# Patient Record
Sex: Male | Born: 1955
Health system: Southern US, Community
[De-identification: ages and names within clinical notes are randomized; demographics above are authoritative.]

## PROBLEM LIST (undated history)

## (undated) DIAGNOSIS — K5792 Diverticulitis of intestine, part unspecified, without perforation or abscess without bleeding: Secondary | ICD-10-CM

## (undated) DIAGNOSIS — J449 Chronic obstructive pulmonary disease, unspecified: Secondary | ICD-10-CM

## (undated) DIAGNOSIS — D696 Thrombocytopenia, unspecified: Secondary | ICD-10-CM

## (undated) DIAGNOSIS — D46Z Other myelodysplastic syndromes: Secondary | ICD-10-CM

## (undated) DIAGNOSIS — N179 Acute kidney failure, unspecified: Secondary | ICD-10-CM

## (undated) DIAGNOSIS — M1990 Unspecified osteoarthritis, unspecified site: Secondary | ICD-10-CM

## (undated) DIAGNOSIS — E872 Acidosis, unspecified: Secondary | ICD-10-CM

## (undated) DIAGNOSIS — K922 Gastrointestinal hemorrhage, unspecified: Secondary | ICD-10-CM

## (undated) DIAGNOSIS — F419 Anxiety disorder, unspecified: Secondary | ICD-10-CM

## (undated) DIAGNOSIS — K7460 Unspecified cirrhosis of liver: Secondary | ICD-10-CM

## (undated) DIAGNOSIS — R45851 Suicidal ideations: Secondary | ICD-10-CM

## (undated) DIAGNOSIS — K219 Gastro-esophageal reflux disease without esophagitis: Secondary | ICD-10-CM

## (undated) DIAGNOSIS — M8700 Idiopathic aseptic necrosis of unspecified bone: Secondary | ICD-10-CM

## (undated) DIAGNOSIS — R51 Headache: Secondary | ICD-10-CM

## (undated) DIAGNOSIS — F32A Depression, unspecified: Secondary | ICD-10-CM

## (undated) DIAGNOSIS — F329 Major depressive disorder, single episode, unspecified: Secondary | ICD-10-CM

## (undated) DIAGNOSIS — D649 Anemia, unspecified: Secondary | ICD-10-CM

## (undated) HISTORY — PX: HERNIA REPAIR: SHX51

## (undated) HISTORY — PX: JOINT REPLACEMENT: SHX530

## (undated) HISTORY — PX: COLOSTOMY REVERSAL: SHX5782

## (undated) HISTORY — PX: HIP SURGERY: SHX245

## (undated) HISTORY — PX: COLON SURGERY: SHX602

---
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
## 1979-08-15 HISTORY — PX: CLAVICLE SURGERY: SHX598

## 1979-08-15 HISTORY — PX: CHEST TUBE INSERTION: SHX231

## 2011-12-27 DIAGNOSIS — I10 Essential (primary) hypertension: Secondary | ICD-10-CM | POA: Diagnosis not present

## 2011-12-27 DIAGNOSIS — F17200 Nicotine dependence, unspecified, uncomplicated: Secondary | ICD-10-CM | POA: Diagnosis not present

## 2011-12-27 DIAGNOSIS — N529 Male erectile dysfunction, unspecified: Secondary | ICD-10-CM | POA: Diagnosis not present

## 2012-05-10 DIAGNOSIS — Z23 Encounter for immunization: Secondary | ICD-10-CM | POA: Diagnosis not present

## 2012-06-07 DIAGNOSIS — K648 Other hemorrhoids: Secondary | ICD-10-CM | POA: Diagnosis not present

## 2012-06-12 ENCOUNTER — Inpatient Hospital Stay: Payer: Self-pay | Admitting: Surgery

## 2012-06-12 DIAGNOSIS — F17200 Nicotine dependence, unspecified, uncomplicated: Secondary | ICD-10-CM | POA: Diagnosis present

## 2012-06-12 DIAGNOSIS — R109 Unspecified abdominal pain: Secondary | ICD-10-CM | POA: Diagnosis not present

## 2012-06-12 DIAGNOSIS — I10 Essential (primary) hypertension: Secondary | ICD-10-CM | POA: Diagnosis present

## 2012-06-12 DIAGNOSIS — L03319 Cellulitis of trunk, unspecified: Secondary | ICD-10-CM | POA: Diagnosis not present

## 2012-06-12 DIAGNOSIS — Z452 Encounter for adjustment and management of vascular access device: Secondary | ICD-10-CM | POA: Diagnosis not present

## 2012-06-12 DIAGNOSIS — R110 Nausea: Secondary | ICD-10-CM | POA: Diagnosis not present

## 2012-06-12 DIAGNOSIS — R0602 Shortness of breath: Secondary | ICD-10-CM | POA: Diagnosis not present

## 2012-06-12 DIAGNOSIS — J9600 Acute respiratory failure, unspecified whether with hypoxia or hypercapnia: Secondary | ICD-10-CM | POA: Diagnosis present

## 2012-06-12 DIAGNOSIS — R918 Other nonspecific abnormal finding of lung field: Secondary | ICD-10-CM | POA: Diagnosis not present

## 2012-06-12 DIAGNOSIS — K651 Peritoneal abscess: Secondary | ICD-10-CM | POA: Diagnosis not present

## 2012-06-12 DIAGNOSIS — K219 Gastro-esophageal reflux disease without esophagitis: Secondary | ICD-10-CM | POA: Diagnosis present

## 2012-06-12 DIAGNOSIS — IMO0002 Reserved for concepts with insufficient information to code with codable children: Secondary | ICD-10-CM | POA: Diagnosis not present

## 2012-06-12 DIAGNOSIS — R000 Tachycardia, unspecified: Secondary | ICD-10-CM | POA: Diagnosis not present

## 2012-06-12 DIAGNOSIS — R6520 Severe sepsis without septic shock: Secondary | ICD-10-CM | POA: Diagnosis present

## 2012-06-12 DIAGNOSIS — K5732 Diverticulitis of large intestine without perforation or abscess without bleeding: Secondary | ICD-10-CM | POA: Diagnosis not present

## 2012-06-12 DIAGNOSIS — E41 Nutritional marasmus: Secondary | ICD-10-CM | POA: Diagnosis not present

## 2012-06-12 DIAGNOSIS — A419 Sepsis, unspecified organism: Secondary | ICD-10-CM | POA: Diagnosis not present

## 2012-06-12 DIAGNOSIS — R6521 Severe sepsis with septic shock: Secondary | ICD-10-CM | POA: Diagnosis present

## 2012-06-12 DIAGNOSIS — E876 Hypokalemia: Secondary | ICD-10-CM | POA: Diagnosis present

## 2012-06-12 LAB — URINALYSIS, COMPLETE
Bilirubin,UR: NEGATIVE
Glucose,UR: NEGATIVE mg/dL (ref 0–75)
Hyaline Cast: 1
Ph: 6 (ref 4.5–8.0)
RBC,UR: 17 /HPF (ref 0–5)
Specific Gravity: 1.021 (ref 1.003–1.030)
Squamous Epithelial: <1

## 2012-06-12 LAB — CBC
HCT: 54.3 % — ABNORMAL HIGH (ref 40.0–52.0)
HGB: 17.9 g/dL (ref 13.0–18.0)
MCH: 29.9 pg (ref 26.0–34.0)
MCHC: 33 g/dL (ref 32.0–36.0)
MCV: 91 fL (ref 80–100)
Platelet: 412 10*3/uL (ref 150–440)
RBC: 6 10*6/uL — ABNORMAL HIGH (ref 4.40–5.90)

## 2012-06-12 LAB — COMPREHENSIVE METABOLIC PANEL
Albumin: 2.7 g/dL — ABNORMAL LOW (ref 3.4–5.0)
Anion Gap: 13 (ref 7–16)
BUN: 31 mg/dL — ABNORMAL HIGH (ref 7–18)
Bilirubin,Total: 1 mg/dL (ref 0.2–1.0)
Creatinine: 1.02 mg/dL (ref 0.60–1.30)
EGFR (African American): >60
Glucose: 131 mg/dL — ABNORMAL HIGH (ref 65–99)
Potassium: 2.3 mmol/L — CL (ref 3.5–5.1)
Sodium: 133 mmol/L — ABNORMAL LOW (ref 136–145)
Total Protein: 8.8 g/dL — ABNORMAL HIGH (ref 6.4–8.2)

## 2012-06-12 LAB — LIPASE, BLOOD: Lipase: 187 U/L (ref 73–393)

## 2012-06-13 LAB — CBC WITH DIFFERENTIAL/PLATELET
Eosinophil %: 0.5 %
HCT: 44.6 % (ref 40.0–52.0)
Lymphocyte #: 1 10*3/uL (ref 1.0–3.6)
Lymphocyte %: 5.7 %
MCV: 92 fL (ref 80–100)
Monocyte #: 1.8 x10 3/mm — ABNORMAL HIGH (ref 0.2–1.0)
Monocyte %: 10.3 %
Neutrophil #: 14.8 10*3/uL — ABNORMAL HIGH (ref 1.4–6.5)
Platelet: 334 10*3/uL (ref 150–440)
RBC: 4.86 10*6/uL (ref 4.40–5.90)
WBC: 17.9 10*3/uL — ABNORMAL HIGH (ref 3.8–10.6)

## 2012-06-13 LAB — BASIC METABOLIC PANEL
Anion Gap: 7 (ref 7–16)
BUN: 21 mg/dL — ABNORMAL HIGH (ref 7–18)
Calcium, Total: 8.6 mg/dL (ref 8.5–10.1)
Chloride: 111 mmol/L — ABNORMAL HIGH (ref 98–107)
Creatinine: 0.96 mg/dL (ref 0.60–1.30)
EGFR (African American): >60
Potassium: 4.8 mmol/L (ref 3.5–5.1)
Sodium: 141 mmol/L (ref 136–145)

## 2012-06-13 LAB — COMPREHENSIVE METABOLIC PANEL
Albumin: 1.9 g/dL — ABNORMAL LOW (ref 3.4–5.0)
Anion Gap: 10 (ref 7–16)
BUN: 24 mg/dL — ABNORMAL HIGH (ref 7–18)
Creatinine: 0.9 mg/dL (ref 0.60–1.30)
EGFR (African American): >60
EGFR (Non-African Amer.): >60
Glucose: 101 mg/dL — ABNORMAL HIGH (ref 65–99)
Osmolality: 287 (ref 275–301)
Potassium: 2.8 mmol/L — ABNORMAL LOW (ref 3.5–5.1)
SGOT(AST): 18 U/L (ref 15–37)
Sodium: 142 mmol/L (ref 136–145)

## 2012-06-14 LAB — CBC WITH DIFFERENTIAL/PLATELET
Basophil #: 0.1 10*3/uL (ref 0.0–0.1)
Basophil %: 0.2 %
Eosinophil #: 0 10*3/uL (ref 0.0–0.7)
Eosinophil %: 0.1 %
HGB: 13.1 g/dL (ref 13.0–18.0)
Lymphocyte #: 1.3 10*3/uL (ref 1.0–3.6)
MCH: 30.5 pg (ref 26.0–34.0)
MCV: 94 fL (ref 80–100)
Monocyte #: 1.5 x10 3/mm — ABNORMAL HIGH (ref 0.2–1.0)
Monocyte %: 6.6 %
Neutrophil %: 87.6 %
Platelet: 414 10*3/uL (ref 150–440)
RBC: 4.28 10*6/uL — ABNORMAL LOW (ref 4.40–5.90)
RDW: 17.2 % — ABNORMAL HIGH (ref 11.5–14.5)

## 2012-06-14 LAB — COMPREHENSIVE METABOLIC PANEL
Anion Gap: 8 (ref 7–16)
BUN: 16 mg/dL (ref 7–18)
Bilirubin,Total: 0.6 mg/dL (ref 0.2–1.0)
Chloride: 118 mmol/L — ABNORMAL HIGH (ref 98–107)
Creatinine: 0.84 mg/dL (ref 0.60–1.30)
EGFR (African American): >60
Potassium: 3.9 mmol/L (ref 3.5–5.1)
Total Protein: 4.9 g/dL — ABNORMAL LOW (ref 6.4–8.2)

## 2012-06-15 LAB — CBC WITH DIFFERENTIAL/PLATELET
Basophil #: 0 10*3/uL (ref 0.0–0.1)
Eosinophil %: 1.8 %
HCT: 36 % — ABNORMAL LOW (ref 40.0–52.0)
HGB: 11.5 g/dL — ABNORMAL LOW (ref 13.0–18.0)
Lymphocyte #: 1.2 10*3/uL (ref 1.0–3.6)
Lymphocyte %: 6.3 %
Monocyte #: 1.6 x10 3/mm — ABNORMAL HIGH (ref 0.2–1.0)
Monocyte %: 8.2 %
Platelet: 389 10*3/uL (ref 150–440)
RBC: 3.92 10*6/uL — ABNORMAL LOW (ref 4.40–5.90)
RDW: 17.2 % — ABNORMAL HIGH (ref 11.5–14.5)

## 2012-06-15 LAB — POTASSIUM: Potassium: 3.2 mmol/L — ABNORMAL LOW

## 2012-06-15 LAB — BASIC METABOLIC PANEL WITH GFR
Anion Gap: 13
BUN: 9 mg/dL
Calcium, Total: 7.5 mg/dL — ABNORMAL LOW
Chloride: 111 mmol/L — ABNORMAL HIGH
Co2: 19 mmol/L — ABNORMAL LOW
Creatinine: 0.74 mg/dL
EGFR (African American): >60
EGFR (Non-African Amer.): >60
Glucose: 100 mg/dL — ABNORMAL HIGH
Osmolality: 284
Potassium: 2.9 mmol/L — ABNORMAL LOW
Sodium: 143 mmol/L

## 2012-06-15 LAB — MAGNESIUM
Magnesium: 1.3 mg/dL — ABNORMAL LOW
Magnesium: 1.6 mg/dL — ABNORMAL LOW

## 2012-06-17 LAB — CBC WITH DIFFERENTIAL/PLATELET
Basophil #: 0.1 10*3/uL (ref 0.0–0.1)
HGB: 10.9 g/dL — ABNORMAL LOW (ref 13.0–18.0)
Lymphocyte #: 1.1 10*3/uL (ref 1.0–3.6)
Lymphocyte %: 11.4 %
MCH: 29.8 pg (ref 26.0–34.0)
MCV: 90 fL (ref 80–100)
Monocyte %: 11.4 %
Neutrophil #: 7 10*3/uL — ABNORMAL HIGH (ref 1.4–6.5)
Platelet: 381 10*3/uL (ref 150–440)
RBC: 3.67 10*6/uL — ABNORMAL LOW (ref 4.40–5.90)
RDW: 17 % — ABNORMAL HIGH (ref 11.5–14.5)
WBC: 9.7 10*3/uL (ref 3.8–10.6)

## 2012-06-17 LAB — BASIC METABOLIC PANEL
Anion Gap: 9 (ref 7–16)
BUN: 8 mg/dL (ref 7–18)
Calcium, Total: 7.9 mg/dL — ABNORMAL LOW (ref 8.5–10.1)
Chloride: 105 mmol/L (ref 98–107)
Creatinine: 0.65 mg/dL (ref 0.60–1.30)
EGFR (Non-African Amer.): >60
Glucose: 118 mg/dL — ABNORMAL HIGH (ref 65–99)
Osmolality: 271 (ref 275–301)
Potassium: 3.3 mmol/L — ABNORMAL LOW (ref 3.5–5.1)

## 2012-06-17 LAB — MAGNESIUM: Magnesium: 1.1 mg/dL — ABNORMAL LOW

## 2012-06-18 LAB — PHOSPHORUS: Phosphorus: 3.4 mg/dL (ref 2.5–4.9)

## 2012-06-18 LAB — BASIC METABOLIC PANEL
BUN: 11 mg/dL (ref 7–18)
Chloride: 104 mmol/L (ref 98–107)
Co2: 23 mmol/L (ref 21–32)
Creatinine: 0.59 mg/dL — ABNORMAL LOW (ref 0.60–1.30)
Osmolality: 274 (ref 275–301)
Sodium: 137 mmol/L (ref 136–145)

## 2012-06-18 LAB — MAGNESIUM: Magnesium: 1.7 mg/dL — ABNORMAL LOW

## 2012-06-19 LAB — MAGNESIUM: Magnesium: 1.5 mg/dL — ABNORMAL LOW

## 2012-06-19 LAB — PHOSPHORUS: Phosphorus: 2.9 mg/dL (ref 2.5–4.9)

## 2012-06-19 LAB — POTASSIUM: Potassium: 3.7 mmol/L (ref 3.5–5.1)

## 2012-06-20 LAB — CBC WITH DIFFERENTIAL/PLATELET
Basophil #: 0.1 x10 3/mm 3
Basophil %: 0.4 %
Eosinophil #: 0.3 x10 3/mm 3
Eosinophil %: 2.3 %
HCT: 35 % — ABNORMAL LOW
HGB: 11.6 g/dL — ABNORMAL LOW
Lymphocyte %: 7.4 %
Lymphs Abs: 1 x10 3/mm 3
MCH: 29.9 pg
MCHC: 33.2 g/dL
MCV: 90 fL
Monocyte #: 1.4 "x10 3/mm " — ABNORMAL HIGH
Monocyte %: 10.1 %
Neutrophil #: 10.7 x10 3/mm 3 — ABNORMAL HIGH
Neutrophil %: 79.8 %
Platelet: 423 x10 3/mm 3
RBC: 3.88 x10 6/mm 3 — ABNORMAL LOW
RDW: 17.3 % — ABNORMAL HIGH
WBC: 13.4 x10 3/mm 3 — ABNORMAL HIGH

## 2012-06-20 LAB — BASIC METABOLIC PANEL
BUN: 15 mg/dL (ref 7–18)
Chloride: 109 mmol/L — ABNORMAL HIGH (ref 98–107)
Co2: 22 mmol/L (ref 21–32)
Creatinine: 0.64 mg/dL (ref 0.60–1.30)
EGFR (African American): >60
EGFR (Non-African Amer.): >60
Osmolality: 280 (ref 275–301)
Potassium: 3.8 mmol/L (ref 3.5–5.1)

## 2012-06-20 LAB — MAGNESIUM: Magnesium: 1.7 mg/dL — ABNORMAL LOW

## 2012-06-20 LAB — PHOSPHORUS: Phosphorus: 3.8 mg/dL (ref 2.5–4.9)

## 2012-06-21 LAB — PHOSPHORUS: Phosphorus: 3.6 mg/dL (ref 2.5–4.9)

## 2012-06-22 LAB — PHOSPHORUS: Phosphorus: 3.8 mg/dL (ref 2.5–4.9)

## 2012-06-22 LAB — POTASSIUM: Potassium: 4.1 mmol/L (ref 3.5–5.1)

## 2012-06-22 LAB — CALCIUM: Calcium, Total: 8.9 mg/dL (ref 8.5–10.1)

## 2012-06-22 LAB — SODIUM: Sodium: 138 mmol/L (ref 136–145)

## 2012-06-23 LAB — POTASSIUM: Potassium: 4.5 mmol/L (ref 3.5–5.1)

## 2012-06-23 LAB — ALBUMIN: Albumin: 2.1 g/dL — ABNORMAL LOW (ref 3.4–5.0)

## 2012-06-23 LAB — PHOSPHORUS: Phosphorus: 3.8 mg/dL (ref 2.5–4.9)

## 2012-06-23 LAB — SODIUM: Sodium: 134 mmol/L — ABNORMAL LOW (ref 136–145)

## 2012-06-24 DIAGNOSIS — I10 Essential (primary) hypertension: Secondary | ICD-10-CM | POA: Diagnosis not present

## 2012-06-24 DIAGNOSIS — K651 Peritoneal abscess: Secondary | ICD-10-CM | POA: Diagnosis not present

## 2012-06-24 DIAGNOSIS — Z433 Encounter for attention to colostomy: Secondary | ICD-10-CM | POA: Diagnosis not present

## 2012-06-24 DIAGNOSIS — F17200 Nicotine dependence, unspecified, uncomplicated: Secondary | ICD-10-CM | POA: Diagnosis not present

## 2012-06-26 DIAGNOSIS — K651 Peritoneal abscess: Secondary | ICD-10-CM | POA: Diagnosis not present

## 2012-06-26 DIAGNOSIS — Z433 Encounter for attention to colostomy: Secondary | ICD-10-CM | POA: Diagnosis not present

## 2012-06-26 DIAGNOSIS — F17200 Nicotine dependence, unspecified, uncomplicated: Secondary | ICD-10-CM | POA: Diagnosis not present

## 2012-06-26 DIAGNOSIS — I10 Essential (primary) hypertension: Secondary | ICD-10-CM | POA: Diagnosis not present

## 2012-06-28 DIAGNOSIS — Z433 Encounter for attention to colostomy: Secondary | ICD-10-CM | POA: Diagnosis not present

## 2012-06-28 DIAGNOSIS — F17200 Nicotine dependence, unspecified, uncomplicated: Secondary | ICD-10-CM | POA: Diagnosis not present

## 2012-06-28 DIAGNOSIS — I10 Essential (primary) hypertension: Secondary | ICD-10-CM | POA: Diagnosis not present

## 2012-06-28 DIAGNOSIS — K651 Peritoneal abscess: Secondary | ICD-10-CM | POA: Diagnosis not present

## 2012-07-01 DIAGNOSIS — K651 Peritoneal abscess: Secondary | ICD-10-CM | POA: Diagnosis not present

## 2012-07-01 DIAGNOSIS — Z433 Encounter for attention to colostomy: Secondary | ICD-10-CM | POA: Diagnosis not present

## 2012-07-01 DIAGNOSIS — F17200 Nicotine dependence, unspecified, uncomplicated: Secondary | ICD-10-CM | POA: Diagnosis not present

## 2012-07-01 DIAGNOSIS — I10 Essential (primary) hypertension: Secondary | ICD-10-CM | POA: Diagnosis not present

## 2012-07-03 DIAGNOSIS — I10 Essential (primary) hypertension: Secondary | ICD-10-CM | POA: Diagnosis not present

## 2012-07-03 DIAGNOSIS — K651 Peritoneal abscess: Secondary | ICD-10-CM | POA: Diagnosis not present

## 2012-07-03 DIAGNOSIS — F17200 Nicotine dependence, unspecified, uncomplicated: Secondary | ICD-10-CM | POA: Diagnosis not present

## 2012-07-03 DIAGNOSIS — Z433 Encounter for attention to colostomy: Secondary | ICD-10-CM | POA: Diagnosis not present

## 2012-07-05 DIAGNOSIS — K651 Peritoneal abscess: Secondary | ICD-10-CM | POA: Diagnosis not present

## 2012-07-05 DIAGNOSIS — F17200 Nicotine dependence, unspecified, uncomplicated: Secondary | ICD-10-CM | POA: Diagnosis not present

## 2012-07-05 DIAGNOSIS — I10 Essential (primary) hypertension: Secondary | ICD-10-CM | POA: Diagnosis not present

## 2012-07-05 DIAGNOSIS — Z433 Encounter for attention to colostomy: Secondary | ICD-10-CM | POA: Diagnosis not present

## 2012-07-08 DIAGNOSIS — F17200 Nicotine dependence, unspecified, uncomplicated: Secondary | ICD-10-CM | POA: Diagnosis not present

## 2012-07-08 DIAGNOSIS — Z433 Encounter for attention to colostomy: Secondary | ICD-10-CM | POA: Diagnosis not present

## 2012-07-08 DIAGNOSIS — K651 Peritoneal abscess: Secondary | ICD-10-CM | POA: Diagnosis not present

## 2012-07-08 DIAGNOSIS — I10 Essential (primary) hypertension: Secondary | ICD-10-CM | POA: Diagnosis not present

## 2012-07-10 DIAGNOSIS — I10 Essential (primary) hypertension: Secondary | ICD-10-CM | POA: Diagnosis not present

## 2012-07-10 DIAGNOSIS — F17200 Nicotine dependence, unspecified, uncomplicated: Secondary | ICD-10-CM | POA: Diagnosis not present

## 2012-07-10 DIAGNOSIS — K651 Peritoneal abscess: Secondary | ICD-10-CM | POA: Diagnosis not present

## 2012-07-10 DIAGNOSIS — Z433 Encounter for attention to colostomy: Secondary | ICD-10-CM | POA: Diagnosis not present

## 2012-07-12 DIAGNOSIS — F17200 Nicotine dependence, unspecified, uncomplicated: Secondary | ICD-10-CM | POA: Diagnosis not present

## 2012-07-12 DIAGNOSIS — Z433 Encounter for attention to colostomy: Secondary | ICD-10-CM | POA: Diagnosis not present

## 2012-07-12 DIAGNOSIS — I10 Essential (primary) hypertension: Secondary | ICD-10-CM | POA: Diagnosis not present

## 2012-07-12 DIAGNOSIS — K651 Peritoneal abscess: Secondary | ICD-10-CM | POA: Diagnosis not present

## 2012-07-15 DIAGNOSIS — F17200 Nicotine dependence, unspecified, uncomplicated: Secondary | ICD-10-CM | POA: Diagnosis not present

## 2012-07-15 DIAGNOSIS — I10 Essential (primary) hypertension: Secondary | ICD-10-CM | POA: Diagnosis not present

## 2012-07-15 DIAGNOSIS — K651 Peritoneal abscess: Secondary | ICD-10-CM | POA: Diagnosis not present

## 2012-07-15 DIAGNOSIS — Z433 Encounter for attention to colostomy: Secondary | ICD-10-CM | POA: Diagnosis not present

## 2012-07-19 DIAGNOSIS — K651 Peritoneal abscess: Secondary | ICD-10-CM | POA: Diagnosis not present

## 2012-07-19 DIAGNOSIS — F17200 Nicotine dependence, unspecified, uncomplicated: Secondary | ICD-10-CM | POA: Diagnosis not present

## 2012-07-19 DIAGNOSIS — I10 Essential (primary) hypertension: Secondary | ICD-10-CM | POA: Diagnosis not present

## 2012-07-19 DIAGNOSIS — Z433 Encounter for attention to colostomy: Secondary | ICD-10-CM | POA: Diagnosis not present

## 2012-07-23 DIAGNOSIS — Z433 Encounter for attention to colostomy: Secondary | ICD-10-CM | POA: Diagnosis not present

## 2012-07-23 DIAGNOSIS — I10 Essential (primary) hypertension: Secondary | ICD-10-CM | POA: Diagnosis not present

## 2012-07-23 DIAGNOSIS — F17200 Nicotine dependence, unspecified, uncomplicated: Secondary | ICD-10-CM | POA: Diagnosis not present

## 2012-07-23 DIAGNOSIS — K651 Peritoneal abscess: Secondary | ICD-10-CM | POA: Diagnosis not present

## 2012-07-26 DIAGNOSIS — K651 Peritoneal abscess: Secondary | ICD-10-CM | POA: Diagnosis not present

## 2012-07-26 DIAGNOSIS — I10 Essential (primary) hypertension: Secondary | ICD-10-CM | POA: Diagnosis not present

## 2012-07-26 DIAGNOSIS — Z433 Encounter for attention to colostomy: Secondary | ICD-10-CM | POA: Diagnosis not present

## 2012-07-26 DIAGNOSIS — F17200 Nicotine dependence, unspecified, uncomplicated: Secondary | ICD-10-CM | POA: Diagnosis not present

## 2012-07-31 DIAGNOSIS — Z433 Encounter for attention to colostomy: Secondary | ICD-10-CM | POA: Diagnosis not present

## 2012-07-31 DIAGNOSIS — F17200 Nicotine dependence, unspecified, uncomplicated: Secondary | ICD-10-CM | POA: Diagnosis not present

## 2012-07-31 DIAGNOSIS — K651 Peritoneal abscess: Secondary | ICD-10-CM | POA: Diagnosis not present

## 2012-07-31 DIAGNOSIS — I10 Essential (primary) hypertension: Secondary | ICD-10-CM | POA: Diagnosis not present

## 2012-08-05 DIAGNOSIS — Z433 Encounter for attention to colostomy: Secondary | ICD-10-CM | POA: Diagnosis not present

## 2012-08-05 DIAGNOSIS — K651 Peritoneal abscess: Secondary | ICD-10-CM | POA: Diagnosis not present

## 2012-08-05 DIAGNOSIS — F17200 Nicotine dependence, unspecified, uncomplicated: Secondary | ICD-10-CM | POA: Diagnosis not present

## 2012-08-05 DIAGNOSIS — I10 Essential (primary) hypertension: Secondary | ICD-10-CM | POA: Diagnosis not present

## 2012-08-15 DIAGNOSIS — K651 Peritoneal abscess: Secondary | ICD-10-CM | POA: Diagnosis not present

## 2012-08-15 DIAGNOSIS — I10 Essential (primary) hypertension: Secondary | ICD-10-CM | POA: Diagnosis not present

## 2012-08-15 DIAGNOSIS — F17200 Nicotine dependence, unspecified, uncomplicated: Secondary | ICD-10-CM | POA: Diagnosis not present

## 2012-08-15 DIAGNOSIS — Z433 Encounter for attention to colostomy: Secondary | ICD-10-CM | POA: Diagnosis not present

## 2012-10-11 ENCOUNTER — Ambulatory Visit: Payer: Self-pay | Admitting: Surgery

## 2012-10-11 DIAGNOSIS — K651 Peritoneal abscess: Secondary | ICD-10-CM | POA: Diagnosis not present

## 2012-10-11 DIAGNOSIS — Z09 Encounter for follow-up examination after completed treatment for conditions other than malignant neoplasm: Secondary | ICD-10-CM | POA: Diagnosis not present

## 2012-10-11 DIAGNOSIS — K5732 Diverticulitis of large intestine without perforation or abscess without bleeding: Secondary | ICD-10-CM | POA: Diagnosis not present

## 2012-10-15 ENCOUNTER — Ambulatory Visit: Payer: Self-pay | Admitting: Surgery

## 2012-10-15 DIAGNOSIS — K5732 Diverticulitis of large intestine without perforation or abscess without bleeding: Secondary | ICD-10-CM | POA: Diagnosis not present

## 2012-10-15 DIAGNOSIS — Z09 Encounter for follow-up examination after completed treatment for conditions other than malignant neoplasm: Secondary | ICD-10-CM | POA: Diagnosis not present

## 2012-10-15 DIAGNOSIS — K651 Peritoneal abscess: Secondary | ICD-10-CM | POA: Diagnosis not present

## 2012-10-17 DIAGNOSIS — K5732 Diverticulitis of large intestine without perforation or abscess without bleeding: Secondary | ICD-10-CM | POA: Diagnosis not present

## 2012-10-31 ENCOUNTER — Ambulatory Visit: Payer: Self-pay | Admitting: Surgery

## 2012-10-31 DIAGNOSIS — K5732 Diverticulitis of large intestine without perforation or abscess without bleeding: Secondary | ICD-10-CM | POA: Diagnosis not present

## 2012-10-31 DIAGNOSIS — I10 Essential (primary) hypertension: Secondary | ICD-10-CM | POA: Diagnosis not present

## 2012-10-31 DIAGNOSIS — Z01812 Encounter for preprocedural laboratory examination: Secondary | ICD-10-CM | POA: Diagnosis not present

## 2012-10-31 LAB — CBC WITH DIFFERENTIAL/PLATELET
Basophil #: 0.1 x10 3/mm 3
Basophil %: 0.6 %
Eosinophil #: 0.5 x10 3/mm 3
Eosinophil %: 4.8 %
HCT: 51.2 %
HGB: 16.6 g/dL
Lymphocyte %: 18.7 %
Lymphs Abs: 2 x10 3/mm 3
MCH: 27.5 pg
MCHC: 32.5 g/dL
MCV: 85 fL
Monocyte #: 0.8 "x10 3/mm "
Monocyte %: 7.6 %
Neutrophil #: 7.5 x10 3/mm 3 — ABNORMAL HIGH
Neutrophil %: 68.3 %
Platelet: 246 x10 3/mm 3
RBC: 6.04 x10 6/mm 3 — ABNORMAL HIGH
RDW: 17.7 % — ABNORMAL HIGH
WBC: 10.9 x10 3/mm 3 — ABNORMAL HIGH

## 2012-10-31 LAB — BASIC METABOLIC PANEL
Anion Gap: 7 (ref 7–16)
Calcium, Total: 9.5 mg/dL (ref 8.5–10.1)
EGFR (African American): >60
EGFR (Non-African Amer.): >60
Osmolality: 274 (ref 275–301)

## 2012-11-07 ENCOUNTER — Inpatient Hospital Stay: Payer: Self-pay | Admitting: Surgery

## 2012-11-07 DIAGNOSIS — K5732 Diverticulitis of large intestine without perforation or abscess without bleeding: Secondary | ICD-10-CM | POA: Diagnosis not present

## 2012-11-07 DIAGNOSIS — I10 Essential (primary) hypertension: Secondary | ICD-10-CM | POA: Diagnosis present

## 2012-11-07 DIAGNOSIS — K630 Abscess of intestine: Secondary | ICD-10-CM | POA: Diagnosis not present

## 2012-11-07 DIAGNOSIS — R1084 Generalized abdominal pain: Secondary | ICD-10-CM | POA: Diagnosis not present

## 2012-11-07 DIAGNOSIS — G8918 Other acute postprocedural pain: Secondary | ICD-10-CM | POA: Diagnosis not present

## 2012-11-07 DIAGNOSIS — K5730 Diverticulosis of large intestine without perforation or abscess without bleeding: Secondary | ICD-10-CM | POA: Diagnosis not present

## 2012-11-07 DIAGNOSIS — Z433 Encounter for attention to colostomy: Secondary | ICD-10-CM | POA: Diagnosis not present

## 2012-11-08 LAB — CBC WITH DIFFERENTIAL/PLATELET
Basophil #: 0 10*3/uL (ref 0.0–0.1)
Basophil %: 0.1 %
Eosinophil #: 0 10*3/uL (ref 0.0–0.7)
Eosinophil %: 0.1 %
HGB: 12.5 g/dL — ABNORMAL LOW (ref 13.0–18.0)
Lymphocyte #: 1.2 10*3/uL (ref 1.0–3.6)
MCH: 28.5 pg (ref 26.0–34.0)
MCHC: 33.4 g/dL (ref 32.0–36.0)
MCV: 85 fL (ref 80–100)
Monocyte #: 1 x10 3/mm (ref 0.2–1.0)
Monocyte %: 7.4 %
Neutrophil #: 11 10*3/uL — ABNORMAL HIGH (ref 1.4–6.5)
RBC: 4.37 10*6/uL — ABNORMAL LOW (ref 4.40–5.90)
RDW: 19 % — ABNORMAL HIGH (ref 11.5–14.5)

## 2012-11-08 LAB — BASIC METABOLIC PANEL
BUN: 8 mg/dL (ref 7–18)
Calcium, Total: 7.5 mg/dL — ABNORMAL LOW (ref 8.5–10.1)
Chloride: 110 mmol/L — ABNORMAL HIGH (ref 98–107)
EGFR (African American): >60
EGFR (Non-African Amer.): >60
Glucose: 95 mg/dL (ref 65–99)
Osmolality: 276 (ref 275–301)
Potassium: 3.8 mmol/L (ref 3.5–5.1)

## 2012-11-08 LAB — PATHOLOGY REPORT

## 2012-11-09 LAB — BASIC METABOLIC PANEL
Anion Gap: 3 — ABNORMAL LOW (ref 7–16)
BUN: 5 mg/dL — ABNORMAL LOW (ref 7–18)
Calcium, Total: 8.5 mg/dL (ref 8.5–10.1)
EGFR (African American): >60
Potassium: 4.2 mmol/L (ref 3.5–5.1)

## 2012-11-09 LAB — CBC WITH DIFFERENTIAL/PLATELET
Basophil #: 0 10*3/uL (ref 0.0–0.1)
Eosinophil #: 0.2 10*3/uL (ref 0.0–0.7)
HCT: 38.6 % — ABNORMAL LOW (ref 40.0–52.0)
Lymphocyte #: 1.6 10*3/uL (ref 1.0–3.6)
Lymphocyte %: 17.4 %
MCH: 28.8 pg (ref 26.0–34.0)
MCHC: 33.4 g/dL (ref 32.0–36.0)
Monocyte #: 0.8 x10 3/mm (ref 0.2–1.0)
Neutrophil #: 6.6 10*3/uL — ABNORMAL HIGH (ref 1.4–6.5)
Neutrophil %: 71 %
RDW: 19.1 % — ABNORMAL HIGH (ref 11.5–14.5)
WBC: 9.3 10*3/uL (ref 3.8–10.6)

## 2012-11-10 LAB — BASIC METABOLIC PANEL
Anion Gap: 2 — ABNORMAL LOW (ref 7–16)
BUN: 3 mg/dL — ABNORMAL LOW (ref 7–18)
Creatinine: 0.65 mg/dL (ref 0.60–1.30)
EGFR (African American): >60
EGFR (Non-African Amer.): >60
Glucose: 93 mg/dL (ref 65–99)
Potassium: 4.7 mmol/L (ref 3.5–5.1)

## 2012-11-11 LAB — BASIC METABOLIC PANEL
Anion Gap: 4 — ABNORMAL LOW (ref 7–16)
BUN: 5 mg/dL — ABNORMAL LOW (ref 7–18)
Creatinine: 0.59 mg/dL — ABNORMAL LOW (ref 0.60–1.30)
EGFR (Non-African Amer.): >60
Glucose: 76 mg/dL (ref 65–99)
Osmolality: 275 (ref 275–301)
Sodium: 140 mmol/L (ref 136–145)

## 2012-11-13 DIAGNOSIS — L89309 Pressure ulcer of unspecified buttock, unspecified stage: Secondary | ICD-10-CM | POA: Diagnosis not present

## 2012-11-13 DIAGNOSIS — Z433 Encounter for attention to colostomy: Secondary | ICD-10-CM | POA: Diagnosis not present

## 2012-11-13 DIAGNOSIS — I10 Essential (primary) hypertension: Secondary | ICD-10-CM | POA: Diagnosis not present

## 2012-11-13 DIAGNOSIS — Z432 Encounter for attention to ileostomy: Secondary | ICD-10-CM | POA: Diagnosis not present

## 2012-11-13 DIAGNOSIS — L8992 Pressure ulcer of unspecified site, stage 2: Secondary | ICD-10-CM | POA: Diagnosis not present

## 2012-11-15 DIAGNOSIS — L8992 Pressure ulcer of unspecified site, stage 2: Secondary | ICD-10-CM | POA: Diagnosis not present

## 2012-11-15 DIAGNOSIS — I10 Essential (primary) hypertension: Secondary | ICD-10-CM | POA: Diagnosis not present

## 2012-11-15 DIAGNOSIS — Z432 Encounter for attention to ileostomy: Secondary | ICD-10-CM | POA: Diagnosis not present

## 2012-11-15 DIAGNOSIS — L89309 Pressure ulcer of unspecified buttock, unspecified stage: Secondary | ICD-10-CM | POA: Diagnosis not present

## 2012-11-15 DIAGNOSIS — Z433 Encounter for attention to colostomy: Secondary | ICD-10-CM | POA: Diagnosis not present

## 2012-11-18 DIAGNOSIS — L89309 Pressure ulcer of unspecified buttock, unspecified stage: Secondary | ICD-10-CM | POA: Diagnosis not present

## 2012-11-18 DIAGNOSIS — Z432 Encounter for attention to ileostomy: Secondary | ICD-10-CM | POA: Diagnosis not present

## 2012-11-18 DIAGNOSIS — L8992 Pressure ulcer of unspecified site, stage 2: Secondary | ICD-10-CM | POA: Diagnosis not present

## 2012-11-18 DIAGNOSIS — Z433 Encounter for attention to colostomy: Secondary | ICD-10-CM | POA: Diagnosis not present

## 2012-11-18 DIAGNOSIS — I10 Essential (primary) hypertension: Secondary | ICD-10-CM | POA: Diagnosis not present

## 2012-11-19 ENCOUNTER — Ambulatory Visit: Payer: Self-pay | Admitting: Surgery

## 2012-11-19 DIAGNOSIS — N200 Calculus of kidney: Secondary | ICD-10-CM | POA: Diagnosis not present

## 2012-11-19 DIAGNOSIS — K5732 Diverticulitis of large intestine without perforation or abscess without bleeding: Secondary | ICD-10-CM | POA: Diagnosis not present

## 2012-11-19 LAB — BASIC METABOLIC PANEL
Anion Gap: 12 (ref 7–16)
BUN: 25 mg/dL — ABNORMAL HIGH (ref 7–18)
Calcium, Total: 10.7 mg/dL — ABNORMAL HIGH (ref 8.5–10.1)
Chloride: 97 mmol/L — ABNORMAL LOW (ref 98–107)
Co2: 27 mmol/L (ref 21–32)
Creatinine: 1.43 mg/dL — ABNORMAL HIGH (ref 0.60–1.30)
EGFR (African American): >60
Glucose: 116 mg/dL — ABNORMAL HIGH (ref 65–99)
Osmolality: 277 (ref 275–301)
Potassium: 4.3 mmol/L (ref 3.5–5.1)
Sodium: 136 mmol/L (ref 136–145)

## 2012-11-19 LAB — CBC WITH DIFFERENTIAL/PLATELET
Basophil %: 0.7 %
Eosinophil #: 0.3 10*3/uL (ref 0.0–0.7)
Eosinophil %: 2.5 %
HCT: 49.6 % (ref 40.0–52.0)
HGB: 15.8 g/dL (ref 13.0–18.0)
Lymphocyte #: 1.9 10*3/uL (ref 1.0–3.6)
Lymphocyte %: 16.8 %
MCH: 26.8 pg (ref 26.0–34.0)
MCV: 84 fL (ref 80–100)
Monocyte %: 6.7 %
Neutrophil #: 8.4 10*3/uL — ABNORMAL HIGH (ref 1.4–6.5)
Neutrophil %: 73.3 %
RDW: 21 % — ABNORMAL HIGH (ref 11.5–14.5)
WBC: 11.4 10*3/uL — ABNORMAL HIGH (ref 3.8–10.6)

## 2012-11-19 LAB — LIPASE, BLOOD: Lipase: 103 U/L (ref 73–393)

## 2012-11-21 DIAGNOSIS — Z432 Encounter for attention to ileostomy: Secondary | ICD-10-CM | POA: Diagnosis not present

## 2012-11-21 DIAGNOSIS — Z433 Encounter for attention to colostomy: Secondary | ICD-10-CM | POA: Diagnosis not present

## 2012-11-21 DIAGNOSIS — L89309 Pressure ulcer of unspecified buttock, unspecified stage: Secondary | ICD-10-CM | POA: Diagnosis not present

## 2012-11-21 DIAGNOSIS — L8992 Pressure ulcer of unspecified site, stage 2: Secondary | ICD-10-CM | POA: Diagnosis not present

## 2012-11-21 DIAGNOSIS — I10 Essential (primary) hypertension: Secondary | ICD-10-CM | POA: Diagnosis not present

## 2012-11-25 DIAGNOSIS — L89309 Pressure ulcer of unspecified buttock, unspecified stage: Secondary | ICD-10-CM | POA: Diagnosis not present

## 2012-11-25 DIAGNOSIS — Z433 Encounter for attention to colostomy: Secondary | ICD-10-CM | POA: Diagnosis not present

## 2012-11-25 DIAGNOSIS — Z432 Encounter for attention to ileostomy: Secondary | ICD-10-CM | POA: Diagnosis not present

## 2012-11-25 DIAGNOSIS — L8992 Pressure ulcer of unspecified site, stage 2: Secondary | ICD-10-CM | POA: Diagnosis not present

## 2012-11-25 DIAGNOSIS — I10 Essential (primary) hypertension: Secondary | ICD-10-CM | POA: Diagnosis not present

## 2012-11-29 DIAGNOSIS — L89309 Pressure ulcer of unspecified buttock, unspecified stage: Secondary | ICD-10-CM | POA: Diagnosis not present

## 2012-11-29 DIAGNOSIS — Z432 Encounter for attention to ileostomy: Secondary | ICD-10-CM | POA: Diagnosis not present

## 2012-11-29 DIAGNOSIS — L8992 Pressure ulcer of unspecified site, stage 2: Secondary | ICD-10-CM | POA: Diagnosis not present

## 2012-11-29 DIAGNOSIS — I10 Essential (primary) hypertension: Secondary | ICD-10-CM | POA: Diagnosis not present

## 2012-11-29 DIAGNOSIS — Z433 Encounter for attention to colostomy: Secondary | ICD-10-CM | POA: Diagnosis not present

## 2012-12-04 DIAGNOSIS — I10 Essential (primary) hypertension: Secondary | ICD-10-CM | POA: Diagnosis not present

## 2012-12-04 DIAGNOSIS — L89309 Pressure ulcer of unspecified buttock, unspecified stage: Secondary | ICD-10-CM | POA: Diagnosis not present

## 2012-12-04 DIAGNOSIS — Z432 Encounter for attention to ileostomy: Secondary | ICD-10-CM | POA: Diagnosis not present

## 2012-12-04 DIAGNOSIS — L8992 Pressure ulcer of unspecified site, stage 2: Secondary | ICD-10-CM | POA: Diagnosis not present

## 2012-12-04 DIAGNOSIS — Z433 Encounter for attention to colostomy: Secondary | ICD-10-CM | POA: Diagnosis not present

## 2012-12-12 DIAGNOSIS — L89309 Pressure ulcer of unspecified buttock, unspecified stage: Secondary | ICD-10-CM | POA: Diagnosis not present

## 2012-12-12 DIAGNOSIS — L8992 Pressure ulcer of unspecified site, stage 2: Secondary | ICD-10-CM | POA: Diagnosis not present

## 2012-12-12 DIAGNOSIS — Z433 Encounter for attention to colostomy: Secondary | ICD-10-CM | POA: Diagnosis not present

## 2012-12-12 DIAGNOSIS — I10 Essential (primary) hypertension: Secondary | ICD-10-CM | POA: Diagnosis not present

## 2012-12-12 DIAGNOSIS — Z432 Encounter for attention to ileostomy: Secondary | ICD-10-CM | POA: Diagnosis not present

## 2012-12-20 DIAGNOSIS — I10 Essential (primary) hypertension: Secondary | ICD-10-CM | POA: Diagnosis not present

## 2012-12-20 DIAGNOSIS — Z432 Encounter for attention to ileostomy: Secondary | ICD-10-CM | POA: Diagnosis not present

## 2012-12-20 DIAGNOSIS — L89309 Pressure ulcer of unspecified buttock, unspecified stage: Secondary | ICD-10-CM | POA: Diagnosis not present

## 2012-12-20 DIAGNOSIS — L8992 Pressure ulcer of unspecified site, stage 2: Secondary | ICD-10-CM | POA: Diagnosis not present

## 2012-12-20 DIAGNOSIS — Z433 Encounter for attention to colostomy: Secondary | ICD-10-CM | POA: Diagnosis not present

## 2012-12-25 ENCOUNTER — Inpatient Hospital Stay: Payer: Self-pay | Admitting: Surgery

## 2012-12-25 DIAGNOSIS — Z432 Encounter for attention to ileostomy: Secondary | ICD-10-CM | POA: Diagnosis not present

## 2012-12-25 DIAGNOSIS — R34 Anuria and oliguria: Secondary | ICD-10-CM | POA: Diagnosis not present

## 2012-12-25 DIAGNOSIS — Z96649 Presence of unspecified artificial hip joint: Secondary | ICD-10-CM | POA: Diagnosis not present

## 2012-12-25 DIAGNOSIS — K651 Peritoneal abscess: Secondary | ICD-10-CM | POA: Diagnosis not present

## 2012-12-25 DIAGNOSIS — R198 Other specified symptoms and signs involving the digestive system and abdomen: Secondary | ICD-10-CM | POA: Diagnosis not present

## 2012-12-25 DIAGNOSIS — K929 Disease of digestive system, unspecified: Secondary | ICD-10-CM | POA: Diagnosis not present

## 2012-12-25 DIAGNOSIS — M8700 Idiopathic aseptic necrosis of unspecified bone: Secondary | ICD-10-CM | POA: Diagnosis present

## 2012-12-25 DIAGNOSIS — R188 Other ascites: Secondary | ICD-10-CM | POA: Diagnosis not present

## 2012-12-25 DIAGNOSIS — K560 Paralytic ileus: Secondary | ICD-10-CM | POA: Diagnosis not present

## 2012-12-25 DIAGNOSIS — I10 Essential (primary) hypertension: Secondary | ICD-10-CM | POA: Diagnosis present

## 2012-12-25 DIAGNOSIS — Z9049 Acquired absence of other specified parts of digestive tract: Secondary | ICD-10-CM | POA: Diagnosis not present

## 2012-12-25 DIAGNOSIS — F17200 Nicotine dependence, unspecified, uncomplicated: Secondary | ICD-10-CM | POA: Diagnosis present

## 2012-12-25 DIAGNOSIS — K6389 Other specified diseases of intestine: Secondary | ICD-10-CM | POA: Diagnosis not present

## 2012-12-25 DIAGNOSIS — Z79899 Other long term (current) drug therapy: Secondary | ICD-10-CM | POA: Diagnosis not present

## 2012-12-25 DIAGNOSIS — N289 Disorder of kidney and ureter, unspecified: Secondary | ICD-10-CM | POA: Diagnosis present

## 2012-12-25 DIAGNOSIS — K5732 Diverticulitis of large intestine without perforation or abscess without bleeding: Secondary | ICD-10-CM | POA: Diagnosis not present

## 2012-12-25 LAB — BASIC METABOLIC PANEL
Calcium, Total: 9.6 mg/dL (ref 8.5–10.1)
Chloride: 104 mmol/L (ref 98–107)
EGFR (African American): >60
EGFR (Non-African Amer.): >60
Osmolality: 270 (ref 275–301)
Potassium: 3.5 mmol/L (ref 3.5–5.1)
Sodium: 136 mmol/L (ref 136–145)

## 2012-12-25 LAB — CBC WITH DIFFERENTIAL/PLATELET
Eosinophil #: 0.8 10*3/uL — ABNORMAL HIGH (ref 0.0–0.7)
Eosinophil %: 8 %
HCT: 46.3 % (ref 40.0–52.0)
Lymphocyte #: 2.2 10*3/uL (ref 1.0–3.6)
Lymphocyte %: 22.3 %
MCH: 29.5 pg (ref 26.0–34.0)
MCHC: 33.9 g/dL (ref 32.0–36.0)
MCV: 87 fL (ref 80–100)
Monocyte %: 7.6 %
Neutrophil #: 6.1 10*3/uL (ref 1.4–6.5)
Platelet: 226 10*3/uL (ref 150–440)
RBC: 5.32 10*6/uL (ref 4.40–5.90)
RDW: 23.8 % — ABNORMAL HIGH (ref 11.5–14.5)
WBC: 9.9 10*3/uL (ref 3.8–10.6)

## 2012-12-26 LAB — BASIC METABOLIC PANEL
BUN: 7 mg/dL (ref 7–18)
Chloride: 107 mmol/L (ref 98–107)
Co2: 27 mmol/L (ref 21–32)
EGFR (African American): >60
EGFR (Non-African Amer.): >60
Glucose: 95 mg/dL (ref 65–99)
Sodium: 138 mmol/L (ref 136–145)

## 2012-12-26 LAB — CBC WITH DIFFERENTIAL/PLATELET
Basophil #: 0 10*3/uL (ref 0.0–0.1)
Basophil %: 0.4 %
Eosinophil %: 5.1 %
HCT: 40.1 % (ref 40.0–52.0)
HGB: 13.7 g/dL (ref 13.0–18.0)
Lymphocyte #: 1.4 10*3/uL (ref 1.0–3.6)
MCH: 30.1 pg (ref 26.0–34.0)
MCHC: 34.1 g/dL (ref 32.0–36.0)
MCV: 88 fL (ref 80–100)
Monocyte #: 0.6 x10 3/mm (ref 0.2–1.0)
Neutrophil #: 4.9 10*3/uL (ref 1.4–6.5)
Neutrophil %: 67.3 %
Platelet: 175 10*3/uL (ref 150–440)

## 2012-12-27 LAB — CBC WITH DIFFERENTIAL/PLATELET
Basophil #: 0 10*3/uL (ref 0.0–0.1)
Basophil %: 0.4 %
Eosinophil #: 0.4 10*3/uL (ref 0.0–0.7)
Eosinophil %: 5.2 %
HCT: 40.4 % (ref 40.0–52.0)
HGB: 13.7 g/dL (ref 13.0–18.0)
Lymphocyte #: 1.4 10*3/uL (ref 1.0–3.6)
MCHC: 33.8 g/dL (ref 32.0–36.0)
Monocyte #: 0.6 x10 3/mm (ref 0.2–1.0)
Neutrophil #: 4.8 10*3/uL (ref 1.4–6.5)
Neutrophil %: 66.7 %
RBC: 4.55 10*6/uL (ref 4.40–5.90)
RDW: 23.2 % — ABNORMAL HIGH (ref 11.5–14.5)
WBC: 7.2 10*3/uL (ref 3.8–10.6)

## 2012-12-27 LAB — BASIC METABOLIC PANEL WITH GFR
Anion Gap: 4 — ABNORMAL LOW
BUN: 5 mg/dL — ABNORMAL LOW
Calcium, Total: 8.7 mg/dL
Chloride: 107 mmol/L
Co2: 28 mmol/L
Creatinine: 0.76 mg/dL
EGFR (African American): >60
EGFR (Non-African Amer.): >60
Glucose: 88 mg/dL
Osmolality: 274
Potassium: 3.8 mmol/L
Sodium: 139 mmol/L

## 2012-12-29 LAB — BASIC METABOLIC PANEL
Anion Gap: 7 (ref 7–16)
BUN: 7 mg/dL (ref 7–18)
Chloride: 105 mmol/L (ref 98–107)
Co2: 24 mmol/L (ref 21–32)
EGFR (African American): >60
EGFR (Non-African Amer.): >60
Glucose: 112 mg/dL — ABNORMAL HIGH (ref 65–99)
Potassium: 3.8 mmol/L (ref 3.5–5.1)

## 2012-12-30 LAB — BASIC METABOLIC PANEL
BUN: 7 mg/dL (ref 7–18)
Chloride: 105 mmol/L (ref 98–107)
Co2: 26 mmol/L (ref 21–32)
EGFR (African American): >60
EGFR (Non-African Amer.): >60
Glucose: 111 mg/dL — ABNORMAL HIGH (ref 65–99)
Sodium: 137 mmol/L (ref 136–145)

## 2012-12-30 LAB — CLOSTRIDIUM DIFFICILE BY PCR

## 2012-12-30 LAB — CBC WITH DIFFERENTIAL/PLATELET
Basophil #: 0 10*3/uL (ref 0.0–0.1)
HCT: 36 % — ABNORMAL LOW (ref 40.0–52.0)
MCH: 30.7 pg (ref 26.0–34.0)
MCHC: 34.8 g/dL (ref 32.0–36.0)
MCV: 88 fL (ref 80–100)
Monocyte %: 14.6 %
Neutrophil #: 3.7 10*3/uL (ref 1.4–6.5)
Neutrophil %: 68.1 %
Platelet: 215 10*3/uL (ref 150–440)
RBC: 4.08 10*6/uL — ABNORMAL LOW (ref 4.40–5.90)
WBC: 5.4 10*3/uL (ref 3.8–10.6)

## 2012-12-31 LAB — CBC WITH DIFFERENTIAL/PLATELET
Basophil #: 0 10*3/uL (ref 0.0–0.1)
Eosinophil #: 0.5 10*3/uL (ref 0.0–0.7)
Eosinophil %: 7.3 %
HCT: 33.5 % — ABNORMAL LOW (ref 40.0–52.0)
Lymphocyte #: 1.5 10*3/uL (ref 1.0–3.6)
Lymphocyte %: 24.4 %
MCH: 30.7 pg (ref 26.0–34.0)
MCHC: 34.3 g/dL (ref 32.0–36.0)
MCV: 89 fL (ref 80–100)
Monocyte %: 12.8 %
Neutrophil %: 55.2 %
Platelet: 202 10*3/uL (ref 150–440)
WBC: 6.4 10*3/uL (ref 3.8–10.6)

## 2012-12-31 LAB — COMPREHENSIVE METABOLIC PANEL
Albumin: 2.2 g/dL — ABNORMAL LOW (ref 3.4–5.0)
Anion Gap: 7 (ref 7–16)
BUN: 5 mg/dL — ABNORMAL LOW (ref 7–18)
Bilirubin,Total: 0.5 mg/dL (ref 0.2–1.0)
Calcium, Total: 8.1 mg/dL — ABNORMAL LOW (ref 8.5–10.1)
Creatinine: 0.54 mg/dL — ABNORMAL LOW (ref 0.60–1.30)
EGFR (African American): >60
Osmolality: 274 (ref 275–301)
Potassium: 3.3 mmol/L — ABNORMAL LOW (ref 3.5–5.1)
SGOT(AST): 12 U/L — ABNORMAL LOW (ref 15–37)
SGPT (ALT): 9 U/L — ABNORMAL LOW (ref 12–78)
Sodium: 139 mmol/L (ref 136–145)
Total Protein: 5.3 g/dL — ABNORMAL LOW (ref 6.4–8.2)

## 2013-05-15 DIAGNOSIS — Z23 Encounter for immunization: Secondary | ICD-10-CM | POA: Diagnosis not present

## 2013-10-28 DIAGNOSIS — K439 Ventral hernia without obstruction or gangrene: Secondary | ICD-10-CM | POA: Diagnosis not present

## 2013-10-28 DIAGNOSIS — N529 Male erectile dysfunction, unspecified: Secondary | ICD-10-CM | POA: Diagnosis not present

## 2013-10-28 DIAGNOSIS — I10 Essential (primary) hypertension: Secondary | ICD-10-CM | POA: Diagnosis not present

## 2013-10-28 DIAGNOSIS — R252 Cramp and spasm: Secondary | ICD-10-CM | POA: Diagnosis not present

## 2013-11-12 ENCOUNTER — Encounter (INDEPENDENT_AMBULATORY_CARE_PROVIDER_SITE_OTHER): Payer: Self-pay | Admitting: Surgery

## 2013-11-12 ENCOUNTER — Ambulatory Visit (INDEPENDENT_AMBULATORY_CARE_PROVIDER_SITE_OTHER): Payer: Medicare Other | Admitting: Surgery

## 2013-11-12 VITALS — BP 140/90 | HR 80 | Temp 98.5°F | Resp 14 | Ht 69.0 in | Wt 182.4 lb

## 2013-11-12 DIAGNOSIS — K432 Incisional hernia without obstruction or gangrene: Secondary | ICD-10-CM | POA: Diagnosis not present

## 2013-11-12 DIAGNOSIS — K439 Ventral hernia without obstruction or gangrene: Secondary | ICD-10-CM

## 2013-11-12 NOTE — Progress Notes (Signed)
General Surgery [facility]  Chief Complaint  Patient presents with  . New Evaluation    evaluate abdominal wall hernias - referral from Dr. Marcellus Scott    HISTORY: Patient is a 58 year old male referred by his primary care physician for evaluation of complex ventral hernia. Patient has a significant recent past surgical history. Details are not available.  Apparently the patient required emergent laparotomy and partial colectomy in November 2013 at [facility]. Patient subsequently returned for colostomy closure in March 2014. However this procedure was complicated and despite colostomy closure, and ileostomy was placed in the right lower quadrant. Patient then returned in June 2014 and underwent takedown of his ileostomy.  Over the past several months the patient has noted a progressive bulge in the upper midline of his surgical wound. This has become gradually larger. He denies any signs or symptoms of obstruction. He denies any significant pain.  Of note, the patient underwent right inguinal hernia repair as a child.  History reviewed. No pertinent past medical history.  Current Outpatient Prescriptions  Medication Sig Dispense Refill  . Ibuprofen (ADVIL) 200 MG CAPS Take 200 mg by mouth as needed.       No current facility-administered medications for this visit.    No Known Allergies  Family History  Problem Relation Age of Onset  . Cancer Mother 44    pancreatic    History   Social History  . Marital Status: Single    Spouse Name: N/A    Number of Children: N/A  . Years of Education: N/A   Social History Main Topics  . Smoking status: Current Every Day Smoker -- 1.00 packs/day    Types: Cigarettes  . Smokeless tobacco: None  . Alcohol Use: Yes     Comment: moderate  . Drug Use: No  . Sexual Activity: None   Other Topics Concern  . None   Social History Narrative  . None    REVIEW OF SYSTEMS - PERTINENT  POSITIVES ONLY: Denies signs or symptoms of obstruction. Recent weight gain. Always reducible.  EXAM: Filed Vitals:   11/12/13 0935  BP: 140/90  Pulse: 80  Temp: 98.5 F (36.9 C)  Resp: 14    GENERAL: well-developed, well-nourished, no acute distress HEENT: normocephalic; pupils equal and reactive; sclerae clear; dentition good; mucous membranes moist NECK:  symmetric on extension; no palpable anterior or posterior cervical lymphadenopathy; no supraclavicular masses; no tenderness CHEST: clear to auscultation bilaterally without rales, rhonchi, or wheezes CARDIAC: regular rate and rhythm without significant murmur; peripheral pulses are full ABDOMEN: soft without distension; bowel sounds present; no mass; no hepatosplenomegaly; multiple surgical incisions in the midline, left mid abdomen, and right lower quadrant; broad scar consistent with prior wound healing by secondary intention; obvious bulge in the upper midline incision with palpable fascial defect measuring 4-5 cm in diameter, reducible, nontender; patient examined and both a standing and recumbent position EXT:  non-tender without edema; no deformity NEURO: no gross focal deficits; no sign of tremor   LABORATORY RESULTS: See [facility] (CHL-Epic) for most recent results  RADIOLOGY RESULTS: See [facility] (CHL-Epic) for most recent results  IMPRESSION: Ventral incisional hernia, reducible  PLAN: Patient and I discussed the above findings at length. Patient apparently had a relatively poor experience during his multiple procedures at [facility]. He does not wish to return there for care.  Patient is not anxious to proceed with surgical repair. I do believe he may  be a candidate for laparoscopic ventral incisional hernia repair with mesh provided there are not extensive adhesions. In order to better evaluate the surgical approach to his hernia repair, I would like to obtain a CT scan of the  abdomen and pelvis. Patient would like to delay any surgical intervention at this time. I think it is safe to do so. I will see him back in four months and we will review his CT scan findings and again discuss possible repair of his ventral incisional hernia at that time. Certainly if the patient should develop any acute symptoms he would return immediately for surgical evaluation.  Patient understands and agrees with this plan.  [name], MD, [facility]  Primary Care Physician: Henrine Screws, MD

## 2013-11-12 NOTE — Patient Instructions (Signed)
Laparoscopic Ventral Hernia Repair Laparoscopic ventral hernia repairis a surgery to fix a ventral hernia. Aventral hernia, also called an incisional hernia, is a bulge of body tissue or intestines that pushes through the front part of the abdomen. This can happen if the connective tissue covering the muscles over the abdomen has a weak spot or is torn because of a surgical cut (incision) from a previous surgery. Laparoscopic ventral hernia repair is often done soon after diagnosis to stop the hernia from getting bigger, becoming uncomfortable, or becoming an emergency. This surgery usually takes about 2 hours, but the time can vary greatly. LET YOUR CAREGIVER KNOW ABOUT:  Any allergies you have.  All medicines you are taking, including steroids, vitamins, herbs, eyedrops, and over-the-counter medicines and creams.  Previous problems you or members of your family have had with the use of anesthetics.  Any blood disorders or bleeding problems you have had.  Past surgeries you have had.  Other health problems you have. RISKS AND COMPLICATIONS  Generally, laparoscopic ventral hernia repair is a safe procedure. However, as with any surgical procedure, complications can occur. Possible complications include:  Bleeding.  Trouble passing urine or having a bowel movement after the operation.  Infection.  Pneumonia.  Blood clots.  Pain in the area of the hernia.  A bulge in the area of the hernia that may be caused by a collection of fluid.  Injury to intestines or other structures in the abdomen.  Return of the hernia after surgery. In some cases, the caregiver may need to stop the laparoscopic procedure and do regular, open surgery. This may be necessary for very difficult hernias, when organs are hard to see, or when bleeding problems occur during surgery. BEFORE THE PROCEDURE   You may need to have blood tests, urine tests, a chest X-ray, or electrocardiography done before the day  of the surgery.  Ask your caregiver about changing or stopping your regular medicines.  You may need to wash with a special type of germ-killing soap.  Do not eat or drink anything for at least 6 hours before the surgery.  Make plans to have someone drive you home after the procedure. PROCEDURE   Small monitors will be put on your body. They are used to check your heart, blood pressure, and oxygen level.  An intravenous (IV) access tube will be put into a vein in your hand or arm. Fluids and medicine will flow directly into your body through the IV tube.  You will be given medicine to make you sleep through the procedure (general anesthetic).  Your abdomen will be cleaned with a special soap to kill any germs on your skin.  Once you are asleep, several small incisions will be made in your abdomen.  The large space in your abdomen will be filled with air so that it expands. This gives the caregiver more room and a better view.  A thin, lighted tube with a tiny camera on the end (laparoscope) is put through a small incision in your abdomen. The camera on the laparoscope sends a picture to a TV screen in the operating room. This gives the caregiver a good view inside the abdomen.  Hollow tubes are put through the other small incisions in your abdomen. The tools needed for the procedure are put through these tubes.  The caregiver puts the tissue or intestines that formed the hernia back in place.  A screen-like patch (mesh) is used to close the hernia. This helps make   the area stronger. Stitches, tacks, or staples are used to keep the mesh in place.  Medicine and a bandage (dressing) or skin glue will be put over the incisions. AFTER THE PROCEDURE   You will stay in a recovery area until the anesthetic wears off. Your blood pressure and pulse will be checked often.  You may be able to go home the same day or may need to stay in the hospital for 1 or 2 days after this surgery. Your  caregiver will decide when you can go home.  You may feel some pain. You will likely be given medicine for pain.  You will be urged to do breathing exercises that involve taking deep breaths. This helps prevent a lung infection after a surgery.  You may have to wear compression stockings while you are in the hospital. These stockings help keep blood clots from forming in your legs. Document Released: 07/17/2012 Document Reviewed: 07/17/2012 [employer] Patient Information 2014 [employer].  

## 2013-11-14 ENCOUNTER — Ambulatory Visit
Admission: RE | Admit: 2013-11-14 | Discharge: 2013-11-14 | Disposition: A | Discharge status: Home / Self Care | Payer: Medicare Other | Source: Ambulatory Visit | Attending: Surgery | Admitting: Surgery

## 2013-11-14 DIAGNOSIS — K439 Ventral hernia without obstruction or gangrene: Secondary | ICD-10-CM

## 2013-11-14 DIAGNOSIS — N281 Cyst of kidney, acquired: Secondary | ICD-10-CM | POA: Diagnosis not present

## 2013-11-14 DIAGNOSIS — N200 Calculus of kidney: Secondary | ICD-10-CM | POA: Diagnosis not present

## 2013-11-14 DIAGNOSIS — K7689 Other specified diseases of liver: Secondary | ICD-10-CM | POA: Diagnosis not present

## 2013-11-14 MED ORDER — IOHEXOL 300 MG/ML  SOLN
100.0000 mL | Freq: Once | INTRAMUSCULAR | Status: AC | PRN
  Administered 2013-11-14: 100 mL via INTRAVENOUS

## 2013-11-17 ENCOUNTER — Telehealth (INDEPENDENT_AMBULATORY_CARE_PROVIDER_SITE_OTHER): Payer: Self-pay

## 2013-11-17 NOTE — Telephone Encounter (Signed)
Ct report in epic. Msg to Dr Harlow Asa to review and advise. Pt is aware of 4 mo f/u in future with Dr Harlow Asa.

## 2013-11-18 NOTE — Telephone Encounter (Signed)
CT scan of the abdomen and pelvis is reviewed. There is attenuation of the anterior abdominal wall without evidence of incarcerated hernia or obstruction. CT scan does not demonstrate the hernia identified on physical examination.  Based on this study I think it is safe to observe this patient for the next several months. Patient does not wish to proceed with surgery at this time. I have scheduled him for a return office visit in 4 months. We will again discuss possible laparoscopic ventral incisional hernia repair with mesh at that time.  [name], MD, [facility] Office: [phone]

## 2013-11-27 ENCOUNTER — Encounter (INDEPENDENT_AMBULATORY_CARE_PROVIDER_SITE_OTHER): Payer: Self-pay

## 2013-12-10 DIAGNOSIS — K439 Ventral hernia without obstruction or gangrene: Secondary | ICD-10-CM | POA: Diagnosis not present

## 2013-12-10 DIAGNOSIS — R21 Rash and other nonspecific skin eruption: Secondary | ICD-10-CM | POA: Diagnosis not present

## 2013-12-10 DIAGNOSIS — I10 Essential (primary) hypertension: Secondary | ICD-10-CM | POA: Diagnosis not present

## 2013-12-10 DIAGNOSIS — N529 Male erectile dysfunction, unspecified: Secondary | ICD-10-CM | POA: Diagnosis not present

## 2014-01-12 DIAGNOSIS — J328 Other chronic sinusitis: Secondary | ICD-10-CM | POA: Diagnosis not present

## 2014-02-24 ENCOUNTER — Encounter (INDEPENDENT_AMBULATORY_CARE_PROVIDER_SITE_OTHER): Payer: Self-pay | Admitting: Surgery

## 2014-02-24 ENCOUNTER — Ambulatory Visit (INDEPENDENT_AMBULATORY_CARE_PROVIDER_SITE_OTHER): Payer: Medicare Other | Admitting: Surgery

## 2014-02-24 VITALS — BP 122/78 | HR 80 | Temp 98.0°F | Ht 69.0 in | Wt 169.0 lb

## 2014-02-24 DIAGNOSIS — K432 Incisional hernia without obstruction or gangrene: Secondary | ICD-10-CM

## 2014-02-24 NOTE — Patient Instructions (Signed)
[facility]  HERNIA REPAIR POST OP INSTRUCTIONS  Always review your discharge instruction sheet given to you by the facility where your surgery was performed.  1. A  prescription for pain medication may be given to you upon discharge.  Take your pain medication as prescribed.  If narcotic pain medicine is not needed, then you may take acetaminophen (Tylenol) or ibuprofen (Advil) as needed.  2. Take your usually prescribed medications unless otherwise directed.  3. If you need a refill on your pain medication, please contact your pharmacy.  They will contact our office to request authorization. Prescriptions will not be filled after 5 pm daily or on weekends.  4. You should follow a light diet the first 24 hours after arrival home, such as soup and crackers or toast.  Be sure to include plenty of fluids daily.  Resume your normal diet the day after surgery.  5. Most patients will experience some swelling and bruising around the surgical site.  Ice packs and reclining will help.  Swelling and bruising can take several days to resolve.   6. It is common to experience some constipation if taking pain medication after surgery.  Increasing fluid intake and taking a stool softener (such as Colace) will usually help or prevent this problem from occurring.  A mild laxative (Milk of Magnesia or Miralax) should be taken according to package directions if there are no bowel movements after 48 hours.  7. Unless discharge instructions indicate otherwise, you may remove your bandages 24-48 hours after surgery, and you may shower at that time.  You may have steri-strips (small skin tapes) in place directly over the incision.  These strips should be left on the skin for 7-10 days.  If your surgeon used skin glue on the incision, you may shower in 24 hours.  The glue will flake off over the next 2-3 weeks.  Any sutures or staples will be removed at the office during your follow-up  visit.  8. ACTIVITIES:  You may resume regular (light) daily activities beginning the next day-such as daily self-care, walking, climbing stairs-gradually increasing activities as tolerated.  You may have sexual intercourse when it is comfortable.  Refrain from any heavy lifting or straining until approved by your doctor.  You may drive when you are no longer taking prescription pain medication, you can comfortably wear a seatbelt, and you can safely maneuver your car and apply brakes.  9. You should see your doctor in the office for a follow-up appointment approximately 2-3 weeks after your surgery.  Make sure that you call for this appointment within a day or two after you arrive home to insure a convenient appointment time. 10.   WHEN TO CALL YOUR DOCTOR: 1. Fever greater than 101.0 2. Inability to urinate 3. Persistent nausea and/or vomiting 4. Extreme swelling or bruising 5. Continued bleeding from incision 6. Increased pain, redness, or drainage from the incision  The clinic staff is available to answer your questions during regular business hours.  Please don't hesitate to call and ask to speak to one of the nurses for clinical concerns.  If you have a medical emergency, go to the nearest emergency room or call 911.  A surgeon from [facility] is always on call for the hospital.   [facility] [address], [address], [place], [place]  [address]  [phone] ? [phone] ? FAX [phone]  [email or web address]   

## 2014-02-24 NOTE — Progress Notes (Signed)
General Surgery [facility]  Chief Complaint  Patient presents with  . Follow-up    evaluate for possible incisional hernia    HISTORY: Patient is a 58 year old male recently evaluated for complex ventral incisional hernia. At my request she underwent a CT scan of the abdomen and pelvis in April 2015. This showed a rectus diastases without significant hernia. No other intra-abdominal pathology of note was identified.  Since his last office visit, the patient has noted progression of his ventral incisional hernia. He is anxious to proceed with operative repair as he feels it is limiting his physical activities. Unfortunately he is still smoking a pack of cigarettes per day and there is little chance that he will quit. Certainly this increases his risk of hernia recurrence regardless of the procedure.  PERTINENT REVIEW OF SYSTEMS: Increasing ventral incisional hernia. No signs or symptoms of obstruction.  EXAM: HEENT: normocephalic; pupils equal and reactive; sclerae clear; dentition good; mucous membranes moist NECK:  No palpable masses in the thyroid bed; symmetric on extension; no palpable anterior or posterior cervical lymphadenopathy; no supraclavicular masses; no tenderness CHEST: clear to auscultation bilaterally CARDIAC: regular rate and rhythm without significant murmur; peripheral pulses are full ABDOMEN: Complex midline and transverse abdominal incisions are well healed and epithelialized; examined in a recumbent and standing position with Valsalva there is a ventral incisional hernia in the midline in the upper portion of his wound which is reducible; fascial defect measures approximately 5 cm in diameter; there is mild to moderate rectus diastases with protuberance of the abdominal wall. EXT:  non-tender without edema; no deformity NEURO: no gross focal deficits; no sign of tremor   IMPRESSION: #1 ventral incisional hernia, reducible, interval  enlargement #2 moderate rectus diastases  PLAN: Patient and I discussed the above findings again. We reviewed his CT scan report from April. We discussed surgical options being open repair with reapproximation of the rectus musculature to the midline and onlay of mesh. This would be an extensive procedure and would require subcutaneous drains. Patient does not want to undergo this procedure. The alternative would be laparoscopic ventral incisional hernia repair with mesh. This would not change the overall contour of his abdominal wall. He would still have a protuberant abdomen. However it would resolve the incisional hernia. I did caution him that there is a chance of recurrence of at least 10%. That risk is increased with his continued smoking.  We discussed the hospital course and postoperative recovery to be expected. We discussed restrictions on his activity. We discussed wearing an abdominal binder. Patient understands and agrees and wishes to proceed in the near future.  The risks and benefits of the procedure have been discussed at length with the patient.  The patient understands the proposed procedure, potential alternative treatments, and the course of recovery to be expected.  All of the patient's questions have been answered at this time.  The patient wishes to proceed with surgery.  Brandon Regal, MD, [facility] Office: [phone]  Visit Diagnoses: 1. Ventral incisional hernia

## 2014-03-09 ENCOUNTER — Encounter (HOSPITAL_COMMUNITY): Payer: Self-pay | Admitting: *Deleted

## 2014-03-09 ENCOUNTER — Encounter (HOSPITAL_COMMUNITY): Payer: Self-pay | Admitting: Pharmacy Technician

## 2014-03-09 NOTE — Patient Instructions (Signed)
[place]  03/09/2014   Your procedure is scheduled on: Monday 03/16/14  Report to [facility] at 07:45 AM.  Call this number if you have problems the morning of surgery [phone]   Remember:   Do not eat food or drink liquids After Midnight.     Take these medicines the morning of surgery with A SIP OF WATER: eye drops if needed   Do not wear jewelry, make-up or nail polish.  Do not wear lotions, powders, or perfumes. You may wear deodorant.  Do not shave 48 hours prior to surgery. Men may shave face and neck.  Do not bring valuables to the hospital.  Contacts, dentures or bridgework may not be worn into surgery.  Leave suitcase in the car. After surgery it may be brought to your room.  For patients admitted to the hospital, checkout time is 11:00 AM the day of discharge.  Paulette Blanch, RN  pre op nurse call if needed [phone]    [facility] - Preparing for Surgery Before surgery, you can play an important role.  Because skin is not sterile, your skin needs to be as free of germs as possible.  You can reduce the number of germs on your skin by washing with CHG (chlorahexidine gluconate) soap before surgery.  CHG is an antiseptic cleaner which kills germs and bonds with the skin to continue killing germs even after washing. Please DO NOT use if you have an allergy to CHG or antibacterial soaps.  If your skin becomes reddened/irritated stop using the CHG and inform your nurse when you arrive at Short Stay. Do not shave (including legs and underarms) for at least 48 hours prior to the first CHG shower.  You may shave your face/neck. Please follow these instructions carefully:  1.  Shower with CHG Soap the night before surgery and the  morning of Surgery.  2.  If you choose to wash your hair, wash your hair first as usual with your  normal  shampoo.  3.  After you shampoo, rinse your hair and body thoroughly to remove the  shampoo.                             4.  Use CHG as you would any other liquid soap.  You can apply chg directly  to the skin and wash                       Gently with a scrungie or clean washcloth.  5.  Apply the CHG Soap to your body ONLY FROM THE NECK DOWN.   Do not use on face/ open                           Wound or open sores. Avoid contact with eyes, ears mouth and genitals (private parts).                       Wash face,  Genitals (private parts) with your normal soap.             6.  Wash thoroughly, paying special attention to the area where your surgery  will be performed.  7.  Thoroughly rinse your body with warm water from the neck down.  8.  DO NOT shower/wash with your normal soap after using and rinsing off  the CHG Soap.                9.  Pat yourself dry with a clean towel.            10.  Wear clean pajamas.            11.  Place clean sheets on your bed the night of your first shower and do not  sleep with pets. Day of Surgery : Do not apply any lotions/deodorants the morning of surgery.  Please wear clean clothes to the hospital/surgery center.  FAILURE TO FOLLOW THESE INSTRUCTIONS MAY RESULT IN THE CANCELLATION OF YOUR SURGERY PATIENT SIGNATURE_________________________________  NURSE SIGNATURE__________________________________  ________________________________________________________________________

## 2014-03-10 ENCOUNTER — Ambulatory Visit (HOSPITAL_COMMUNITY)
Admission: RE | Admit: 2014-03-10 | Discharge: 2014-03-10 | Disposition: A | Discharge status: Home / Self Care | Payer: Medicare Other | Source: Ambulatory Visit | Attending: Anesthesiology | Admitting: Anesthesiology

## 2014-03-10 ENCOUNTER — Encounter (HOSPITAL_COMMUNITY): Payer: Self-pay

## 2014-03-10 ENCOUNTER — Encounter (HOSPITAL_COMMUNITY)
Admission: RE | Admit: 2014-03-10 | Discharge: 2014-03-10 | Disposition: A | Discharge status: Home / Self Care | Payer: Medicare Other | Source: Ambulatory Visit | Attending: Surgery | Admitting: Surgery

## 2014-03-10 DIAGNOSIS — Z01818 Encounter for other preprocedural examination: Secondary | ICD-10-CM | POA: Diagnosis not present

## 2014-03-10 DIAGNOSIS — Z01812 Encounter for preprocedural laboratory examination: Secondary | ICD-10-CM | POA: Insufficient documentation

## 2014-03-10 DIAGNOSIS — R918 Other nonspecific abnormal finding of lung field: Secondary | ICD-10-CM | POA: Diagnosis not present

## 2014-03-10 HISTORY — DX: Unspecified osteoarthritis, unspecified site: M19.90

## 2014-03-10 HISTORY — DX: Headache: R51

## 2014-03-10 LAB — CBC
HCT: 49 % (ref 39.0–52.0)
HEMOGLOBIN: 16.1 g/dL (ref 13.0–17.0)
MCH: 31.3 pg (ref 26.0–34.0)
MCHC: 32.9 g/dL (ref 30.0–36.0)
MCV: 95.1 fL (ref 78.0–100.0)
Platelets: 209 10*3/uL (ref 150–400)
RBC: 5.15 MIL/uL (ref 4.22–5.81)
RDW: 15.7 % — ABNORMAL HIGH (ref 11.5–15.5)
WBC: 12.2 10*3/uL — AB (ref 4.0–10.5)

## 2014-03-10 LAB — BASIC METABOLIC PANEL
ANION GAP: 11 (ref 5–15)
BUN: 7 mg/dL (ref 6–23)
CALCIUM: 9.5 mg/dL (ref 8.4–10.5)
CHLORIDE: 100 meq/L (ref 96–112)
CO2: 30 meq/L (ref 19–32)
Creatinine, Ser: 0.79 mg/dL (ref 0.50–1.35)
GFR calc Af Amer: >90 mL/min (ref >90)
GFR calc non Af Amer: >90 mL/min (ref >90)
GLUCOSE: 103 mg/dL — AB (ref 70–99)
Potassium: 4.9 mEq/L (ref 3.7–5.3)
SODIUM: 141 meq/L (ref 137–147)

## 2014-03-10 NOTE — Progress Notes (Signed)
Quick Note:  Pre-operative chest x-ray is acceptable for scheduled surgery.  Breezy Hertenstein M. Melvern Ramone, MD, FACS [facility] Office: [phone]   ______ 

## 2014-03-10 NOTE — Progress Notes (Signed)
Quick Note:  These results are acceptable for scheduled surgery.  Tilden Broz M. Divante Kotch, MD, FACS [facility] Office: [phone]   ______ 

## 2014-03-16 ENCOUNTER — Inpatient Hospital Stay (HOSPITAL_COMMUNITY): Payer: Medicare Other | Admitting: Anesthesiology

## 2014-03-16 ENCOUNTER — Encounter (HOSPITAL_COMMUNITY)
Admission: RE | Disposition: A | Discharge status: Home / Self Care | Payer: Self-pay | Source: Ambulatory Visit | Attending: Surgery

## 2014-03-16 ENCOUNTER — Encounter (HOSPITAL_COMMUNITY): Payer: Self-pay | Admitting: *Deleted

## 2014-03-16 ENCOUNTER — Encounter (HOSPITAL_COMMUNITY): Payer: Medicare Other | Admitting: Anesthesiology

## 2014-03-16 ENCOUNTER — Inpatient Hospital Stay (HOSPITAL_COMMUNITY)
Admission: RE | Admit: 2014-03-16 | Discharge: 2014-03-18 | DRG: 355 | Disposition: A | Discharge status: Home / Self Care | Payer: Medicare Other | Source: Ambulatory Visit | Attending: Surgery | Admitting: Surgery

## 2014-03-16 DIAGNOSIS — M6200 Separation of muscle (nontraumatic), unspecified site: Secondary | ICD-10-CM | POA: Diagnosis present

## 2014-03-16 DIAGNOSIS — K432 Incisional hernia without obstruction or gangrene: Secondary | ICD-10-CM | POA: Diagnosis present

## 2014-03-16 HISTORY — PX: VENTRAL HERNIA REPAIR: SHX424

## 2014-03-16 HISTORY — PX: INSERTION OF MESH: SHX5868

## 2014-03-16 HISTORY — DX: Diverticulitis of intestine, part unspecified, without perforation or abscess without bleeding: K57.92

## 2014-03-16 SURGERY — REPAIR, HERNIA, VENTRAL, LAPAROSCOPIC
Anesthesia: General | Site: Abdomen

## 2014-03-16 MED ORDER — PROPOFOL 10 MG/ML IV BOLUS
INTRAVENOUS | Status: DC | PRN
  Administered 2014-03-16: 200 mg via INTRAVENOUS

## 2014-03-16 MED ORDER — ROCURONIUM BROMIDE 100 MG/10ML IV SOLN
INTRAVENOUS | Status: AC
  Filled 2014-03-16: qty 1

## 2014-03-16 MED ORDER — HYDROMORPHONE HCL PF 1 MG/ML IJ SOLN
INTRAMUSCULAR | Status: AC
  Filled 2014-03-16: qty 1

## 2014-03-16 MED ORDER — 0.9 % SODIUM CHLORIDE (POUR BTL) OPTIME
TOPICAL | Status: DC | PRN
  Administered 2014-03-16: 1000 mL

## 2014-03-16 MED ORDER — FENTANYL CITRATE 0.05 MG/ML IJ SOLN
INTRAMUSCULAR | Status: AC
  Filled 2014-03-16: qty 2

## 2014-03-16 MED ORDER — PROPOFOL 10 MG/ML IV BOLUS
INTRAVENOUS | Status: AC
  Filled 2014-03-16: qty 20

## 2014-03-16 MED ORDER — ONDANSETRON HCL 4 MG PO TABS: 4.0000 mg | ORAL_TABLET | Freq: Four times a day (QID) | ORAL | Status: DC | PRN

## 2014-03-16 MED ORDER — SODIUM CHLORIDE 0.9 % IV SOLN
INTRAVENOUS | Status: AC
  Filled 2014-03-16: qty 1

## 2014-03-16 MED ORDER — MIDAZOLAM HCL 5 MG/5ML IJ SOLN
INTRAMUSCULAR | Status: DC | PRN
  Administered 2014-03-16: 2 mg via INTRAVENOUS

## 2014-03-16 MED ORDER — SODIUM CHLORIDE 0.9 % IV SOLN
1.0000 g | INTRAVENOUS | Status: AC
  Administered 2014-03-16: 1 g via INTRAVENOUS
  Filled 2014-03-16: qty 1

## 2014-03-16 MED ORDER — HYDROCODONE-ACETAMINOPHEN 5-325 MG PO TABS
1.0000 | ORAL_TABLET | ORAL | Status: DC | PRN
  Administered 2014-03-16: 2 via ORAL
  Filled 2014-03-16 (×3): qty 2

## 2014-03-16 MED ORDER — LACTATED RINGERS IV SOLN: INTRAVENOUS | Status: DC | PRN

## 2014-03-16 MED ORDER — BUPIVACAINE-EPINEPHRINE 0.25% -1:200000 IJ SOLN
INTRAMUSCULAR | Status: DC | PRN
  Administered 2014-03-16: 20 mL

## 2014-03-16 MED ORDER — LIDOCAINE HCL (CARDIAC) 20 MG/ML IV SOLN
INTRAVENOUS | Status: DC | PRN
  Administered 2014-03-16: 60 mg via INTRAVENOUS

## 2014-03-16 MED ORDER — PROMETHAZINE HCL 25 MG/ML IJ SOLN: 6.2500 mg | INTRAMUSCULAR | Status: DC | PRN

## 2014-03-16 MED ORDER — MIDAZOLAM HCL 2 MG/2ML IJ SOLN
INTRAMUSCULAR | Status: AC
  Filled 2014-03-16: qty 2

## 2014-03-16 MED ORDER — GLYCOPYRROLATE 0.2 MG/ML IJ SOLN
INTRAMUSCULAR | Status: DC | PRN
  Administered 2014-03-16: 0.6 mg via INTRAVENOUS

## 2014-03-16 MED ORDER — ROCURONIUM BROMIDE 100 MG/10ML IV SOLN
INTRAVENOUS | Status: DC | PRN
  Administered 2014-03-16: 10 mg via INTRAVENOUS
  Administered 2014-03-16: 40 mg via INTRAVENOUS

## 2014-03-16 MED ORDER — LACTATED RINGERS IV SOLN
INTRAVENOUS | Status: DC | PRN
  Administered 2014-03-16: 10:00:00 via INTRAVENOUS

## 2014-03-16 MED ORDER — FENTANYL CITRATE 0.05 MG/ML IJ SOLN
25.0000 ug | INTRAMUSCULAR | Status: DC | PRN
  Administered 2014-03-16 (×2): 50 ug via INTRAVENOUS

## 2014-03-16 MED ORDER — ACETAMINOPHEN 325 MG PO TABS: 650.0000 mg | ORAL_TABLET | ORAL | Status: DC | PRN

## 2014-03-16 MED ORDER — PHENYLEPHRINE HCL 10 MG/ML IJ SOLN
INTRAMUSCULAR | Status: DC | PRN
  Administered 2014-03-16: 80 ug via INTRAVENOUS

## 2014-03-16 MED ORDER — FENTANYL CITRATE 0.05 MG/ML IJ SOLN
INTRAMUSCULAR | Status: DC | PRN
  Administered 2014-03-16: 100 ug via INTRAVENOUS
  Administered 2014-03-16 (×5): 50 ug via INTRAVENOUS

## 2014-03-16 MED ORDER — HYDROMORPHONE HCL PF 1 MG/ML IJ SOLN
0.5000 mg | INTRAMUSCULAR | Status: DC | PRN
  Administered 2014-03-16 (×2): 0.5 mg via INTRAVENOUS

## 2014-03-16 MED ORDER — NEOSTIGMINE METHYLSULFATE 10 MG/10ML IV SOLN
INTRAVENOUS | Status: DC | PRN
  Administered 2014-03-16: 5 mg via INTRAVENOUS

## 2014-03-16 MED ORDER — KCL IN DEXTROSE-NACL 30-5-0.45 MEQ/L-%-% IV SOLN
INTRAVENOUS | Status: DC
  Administered 2014-03-16 – 2014-03-17 (×2): via INTRAVENOUS
  Administered 2014-03-17: 75 mL via INTRAVENOUS
  Filled 2014-03-16 (×5): qty 1000

## 2014-03-16 MED ORDER — SUCCINYLCHOLINE CHLORIDE 20 MG/ML IJ SOLN
INTRAMUSCULAR | Status: DC | PRN
  Administered 2014-03-16: 100 mg via INTRAVENOUS

## 2014-03-16 MED ORDER — HYDROMORPHONE HCL PF 1 MG/ML IJ SOLN
0.2500 mg | INTRAMUSCULAR | Status: DC | PRN
  Administered 2014-03-16 (×4): 0.5 mg via INTRAVENOUS

## 2014-03-16 MED ORDER — PHENYLEPHRINE 40 MCG/ML (10ML) SYRINGE FOR IV PUSH (FOR BLOOD PRESSURE SUPPORT)
PREFILLED_SYRINGE | INTRAVENOUS | Status: AC
  Filled 2014-03-16: qty 10

## 2014-03-16 MED ORDER — ONDANSETRON HCL 4 MG/2ML IJ SOLN
INTRAMUSCULAR | Status: DC | PRN
  Administered 2014-03-16: 4 mg via INTRAVENOUS

## 2014-03-16 MED ORDER — HYDROMORPHONE HCL PF 1 MG/ML IJ SOLN
1.0000 mg | INTRAMUSCULAR | Status: DC | PRN
  Administered 2014-03-16 – 2014-03-18 (×16): 1 mg via INTRAVENOUS
  Filled 2014-03-16 (×17): qty 1

## 2014-03-16 MED ORDER — SODIUM CHLORIDE 0.9 % IR SOLN
Status: DC | PRN
  Administered 2014-03-16: 1000 mL

## 2014-03-16 MED ORDER — ONDANSETRON HCL 4 MG/2ML IJ SOLN
4.0000 mg | Freq: Four times a day (QID) | INTRAMUSCULAR | Status: DC | PRN
  Administered 2014-03-17 – 2014-03-18 (×2): 4 mg via INTRAVENOUS
  Filled 2014-03-16 (×2): qty 2

## 2014-03-16 MED ORDER — LIDOCAINE HCL (CARDIAC) 20 MG/ML IV SOLN
INTRAVENOUS | Status: AC
  Filled 2014-03-16: qty 5

## 2014-03-16 MED ORDER — ALPRAZOLAM 0.5 MG PO TABS
0.5000 mg | ORAL_TABLET | Freq: Every evening | ORAL | Status: DC | PRN
  Administered 2014-03-18: 0.5 mg via ORAL
  Filled 2014-03-16: qty 1

## 2014-03-16 MED ORDER — BUPIVACAINE-EPINEPHRINE (PF) 0.25% -1:200000 IJ SOLN
INTRAMUSCULAR | Status: AC
  Filled 2014-03-16: qty 30

## 2014-03-16 MED ORDER — FENTANYL CITRATE 0.05 MG/ML IJ SOLN
INTRAMUSCULAR | Status: AC
  Filled 2014-03-16: qty 5

## 2014-03-16 SURGICAL SUPPLY — 36 items
BENZOIN TINCTURE PRP APPL 2/3 (GAUZE/BANDAGES/DRESSINGS) ×3 IMPLANT
BINDER ABDOMINAL 12 ML 46-62 (SOFTGOODS) ×3 IMPLANT
CANISTER SUCTION 2500CC (MISCELLANEOUS) ×3 IMPLANT
CHLORAPREP W/TINT 26ML (MISCELLANEOUS) ×3 IMPLANT
CLOSURE STERI-STRIP 1/4X4 (GAUZE/BANDAGES/DRESSINGS) ×3 IMPLANT
CLOSURE WOUND 1/2 X4 (GAUZE/BANDAGES/DRESSINGS) ×2
DECANTER SPIKE VIAL GLASS SM (MISCELLANEOUS) ×3 IMPLANT
DEVICE SECURE STRAP 25 ABSORB (INSTRUMENTS) ×9 IMPLANT
DEVICE TROCAR PUNCTURE CLOSURE (ENDOMECHANICALS) ×6 IMPLANT
DRAPE INCISE IOBAN 66X45 STRL (DRAPES) IMPLANT
DRAPE LAPAROSCOPIC ABDOMINAL (DRAPES) ×3 IMPLANT
DRAPE UTILITY XL STRL (DRAPES) ×3 IMPLANT
ELECT REM PT RETURN 9FT ADLT (ELECTROSURGICAL) ×3
ELECTRODE REM PT RTRN 9FT ADLT (ELECTROSURGICAL) ×1 IMPLANT
GAUZE SPONGE 2X2 8PLY STRL LF (GAUZE/BANDAGES/DRESSINGS) ×1 IMPLANT
GLOVE SURG ORTHO 8.0 STRL STRW (GLOVE) ×3 IMPLANT
GOWN STRL REUS W/TWL XL LVL3 (GOWN DISPOSABLE) ×6 IMPLANT
KIT BASIN OR (CUSTOM PROCEDURE TRAY) ×3 IMPLANT
MARKER SKIN DUAL TIP RULER LAB (MISCELLANEOUS) ×3 IMPLANT
MESH VENTRALIGHT ST 8X10 (Mesh General) ×3 IMPLANT
NEEDLE SPNL 22GX3.5 QUINCKE BK (NEEDLE) ×3 IMPLANT
SCISSORS LAP 5X35 DISP (ENDOMECHANICALS) ×3 IMPLANT
SET IRRIG TUBING LAPAROSCOPIC (IRRIGATION / IRRIGATOR) ×3 IMPLANT
SHEARS HARMONIC ACE PLUS 36CM (ENDOMECHANICALS) IMPLANT
SLEEVE XCEL OPT CAN 5 100 (ENDOMECHANICALS) ×9 IMPLANT
SOLUTION ANTI FOG 6CC (MISCELLANEOUS) ×3 IMPLANT
SPONGE GAUZE 2X2 STER 10/PKG (GAUZE/BANDAGES/DRESSINGS) ×2
STRIP CLOSURE SKIN 1/2X4 (GAUZE/BANDAGES/DRESSINGS) ×4 IMPLANT
SUT NOVA NAB DX-16 0-1 5-0 T12 (SUTURE) ×6 IMPLANT
TACKER 5MM HERNIA 3.5CML NAB (ENDOMECHANICALS) IMPLANT
TAPE CLOTH SURG 4X10 WHT LF (GAUZE/BANDAGES/DRESSINGS) ×3 IMPLANT
TOWEL OR 17X26 10 PK STRL BLUE (TOWEL DISPOSABLE) ×3 IMPLANT
TOWEL OR NON WOVEN STRL DISP B (DISPOSABLE) ×3 IMPLANT
TRAY FOLEY CATH 14FRSI W/METER (CATHETERS) ×3 IMPLANT
TRAY LAP CHOLE (CUSTOM PROCEDURE TRAY) ×3 IMPLANT
TUBING INSUFFLATION 10FT LAP (TUBING) ×3 IMPLANT

## 2014-03-16 NOTE — Op Note (Signed)
[name], [name] NO.:  [id number]  MEDICAL RECORD NO.:  [id number]  LOCATION:  [facility]                         FACILITY:  [facility]  PHYSICIAN:  [name], MD      DATE OF BIRTH:  Jan 09, 1956  DATE OF PROCEDURE:  03/16/2014                              OPERATIVE REPORT   PREOPERATIVE DIAGNOSIS:  Ventral incisional hernia.  POSTOPERATIVE DIAGNOSIS:  Ventral incisional hernia.  PROCEDURE:  Laparoscopic repair of ventral incisional hernia with mesh.  SURGEON:  [name], MD, FACS  ANESTHESIA:  General.  ESTIMATED BLOOD LOSS:  Minimal.  PREPARATION:  ChloraPrep.  COMPLICATIONS:  None.  INDICATIONS:  The patient is a 58 year old male with a history of multiple complex surgical procedures on the abdomen.  He has had at least 3 distinct surgical procedures and incisions.  He has developed a complex ventral incisional hernia with multiple defects.  CT scan of the abdomen and pelvis was obtained in April, 2015 which shows a rectus diastasis.  The patient now comes to surgery for repair of his ventral incisional hernia with mesh.  BODY OF REPORT:  Procedure was done in OR #1 at the [facility]  The patient was brought to the operating room, placed in supine position on the operating room table.  Following administration of general anesthesia, the patient was positioned and then prepped and draped in the usual aseptic fashion.  After ascertaining that an adequate level of anesthesia had been achieved, an incision was made at the left costal margin.  Using a 5-mm Optiview trocar, the laparoscope was introduced into the peritoneal cavity. Pneumoperitoneum was established.  A second operative port was placed in the left lower quadrant being an 11 mm trocar.  Two additional 5 mm trocars were placed in the right lower quadrant and right upper quadrant.  Adhesions to the anterior abdominal wall were taken down with sharp dissection using  the scissors.  The extent of the fascial defect was clearly defined.  There was some fat on the anterior abdominal wall in the lower midline which was mobilized with the Endo Shears allowing for exposure of the underlying fascia.  Likewise, the falciform ligament was taken down for several centimeters to expose the underlying fascia. There were at least 3 discrete fascial defects as well as a diastasis of the remaining fascia.  The scope of the defects was covered broadly by a 20/25 cm sheet of Bard Ventralex ST mesh.  There was at least a 5 cm overlap in all directions.  Mesh was prepared by marking 8 sites for suture placement circumferentially.  Eight #1 Novafil simple sutures were placed.  Mesh was rolled, moistened, and inserted into the peritoneal cavity.  It was deployed and properly oriented.  The suture tags were then retrieved by making an incision on the skin with a #11 blade and using the EndoCatch to retrieve the suture tags from each of the 8 sutures around the periphery of the mesh.  These were then pulled taut bringing the mesh into approximation with the anterior abdominal wall with broad coverage of all fascial defects.  Sutures were tied securely.  Using a secured  strap tacking device, two concentric rows of tacks were placed around the periphery of the mesh again with good approximation of the mesh to the anterior abdominal wall, and broad coverage of all fascial defects.  Pneumoperitoneum was released under direct vision.  Ports were removed under direct vision and good hemostasis was noted at all port sites. Pneumoperitoneum was evacuated.  Port sites were anesthetized with local anesthetic.  Wounds were closed with interrupted 4-0 Monocryl subcuticular sutures.  Wounds were washed and dried.  Benzoin and Steri- Strips were applied to all wounds.  A 2 x 2 gauze sponges were applied to all wounds.  A 12-inch abdominal binder was placed around the patient prior to  leaving the operating room.  The patient tolerated the procedure well.   [name], MD, [facility] Office: [phone]   TMG/MEDQ  D:  03/16/2014  T:  03/16/2014  Job:  [id number]

## 2014-03-16 NOTE — H&P (View-Only) (Signed)
General Surgery [facility]  Chief Complaint  Patient presents with  . Follow-up    evaluate for possible incisional hernia    HISTORY: Patient is a 58 year old male recently evaluated for complex ventral incisional hernia. At my request she underwent a CT scan of the abdomen and pelvis in April 2015. This showed a rectus diastases without significant hernia. No other intra-abdominal pathology of note was identified.  Since his last office visit, the patient has noted progression of his ventral incisional hernia. He is anxious to proceed with operative repair as he feels it is limiting his physical activities. Unfortunately he is still smoking a pack of cigarettes per day and there is little chance that he will quit. Certainly this increases his risk of hernia recurrence regardless of the procedure.  PERTINENT REVIEW OF SYSTEMS: Increasing ventral incisional hernia. No signs or symptoms of obstruction.  EXAM: HEENT: normocephalic; pupils equal and reactive; sclerae clear; dentition good; mucous membranes moist NECK:  No palpable masses in the thyroid bed; symmetric on extension; no palpable anterior or posterior cervical lymphadenopathy; no supraclavicular masses; no tenderness CHEST: clear to auscultation bilaterally CARDIAC: regular rate and rhythm without significant murmur; peripheral pulses are full ABDOMEN: Complex midline and transverse abdominal incisions are well healed and epithelialized; examined in a recumbent and standing position with Valsalva there is a ventral incisional hernia in the midline in the upper portion of his wound which is reducible; fascial defect measures approximately 5 cm in diameter; there is mild to moderate rectus diastases with protuberance of the abdominal wall. EXT:  non-tender without edema; no deformity NEURO: no gross focal deficits; no sign of tremor   IMPRESSION: #1 ventral incisional hernia, reducible, interval  enlargement #2 moderate rectus diastases  PLAN: Patient and I discussed the above findings again. We reviewed his CT scan report from April. We discussed surgical options being open repair with reapproximation of the rectus musculature to the midline and onlay of mesh. This would be an extensive procedure and would require subcutaneous drains. Patient does not want to undergo this procedure. The alternative would be laparoscopic ventral incisional hernia repair with mesh. This would not change the overall contour of his abdominal wall. He would still have a protuberant abdomen. However it would resolve the incisional hernia. I did caution him that there is a chance of recurrence of at least 10%. That risk is increased with his continued smoking.  We discussed the hospital course and postoperative recovery to be expected. We discussed restrictions on his activity. We discussed wearing an abdominal binder. Patient understands and agrees and wishes to proceed in the near future.  The risks and benefits of the procedure have been discussed at length with the patient.  The patient understands the proposed procedure, potential alternative treatments, and the course of recovery to be expected.  All of the patient's questions have been answered at this time.  The patient wishes to proceed with surgery.  [name], MD, [facility] Office: [phone]  Visit Diagnoses: 1. Ventral incisional hernia

## 2014-03-16 NOTE — Interval H&P Note (Signed)
History and Physical Interval Note:  03/16/2014 8:55 AM  [name]  has presented today for surgery, with the diagnosis of ventral incisional hernia.  The various methods of treatment have been discussed with the patient and family. After consideration of risks, benefits and other options for treatment, the patient has consented to    Procedure(s): [name] (N/A) INSERTION OF MESH (N/A) as a surgical intervention .    The patient's history has been reviewed, patient examined, no change in status, stable for surgery.  I have reviewed the patient's chart and labs.  Questions were answered to the patient's satisfaction.    [name], MD, [facility] Office: [place]

## 2014-03-16 NOTE — Anesthesia Postprocedure Evaluation (Signed)
  Anesthesia Post-op Note  Patient: [name]  Procedure(s) Performed: Procedure(s): LAPAROSCOPIC VENTRAL INCISIONAL HERNIA REPAIR WITH MESH (N/A) INSERTION OF MESH (N/A)  Patient Location: PACU  Anesthesia Type:General  Level of Consciousness: awake, alert  and oriented  Airway and Oxygen Therapy: Patient Spontanous Breathing  Post-op Pain: none  Post-op Assessment: Post-op Vital signs reviewed  Post-op Vital Signs: Reviewed  Last Vitals:  Filed Vitals:   03/16/14 1315  BP: 149/93  Pulse: 86  Temp:   Resp: 16    Complications: No apparent anesthesia complications

## 2014-03-16 NOTE — Brief Op Note (Signed)
03/16/2014  12:07 PM  PATIENT:  Rolland Porter Lampert  58 y.o. male  PRE-OPERATIVE DIAGNOSIS:  ventral incisional hernia  POST-OPERATIVE DIAGNOSIS:  Ventral incisional hernia  PROCEDURE:  Procedure(s): LAPAROSCOPIC VENTRAL INCISIONAL HERNIA REPAIR WITH MESH (N/A) INSERTION OF MESH (N/A)  SURGEON:  Surgeon(s) and Role:    * [name], MD - Primary  ANESTHESIA:   general  EBL:     BLOOD ADMINISTERED:none  DRAINS: none   LOCAL MEDICATIONS USED:  MARCAINE    and NONE  SPECIMEN:  No Specimen  DISPOSITION OF SPECIMEN:  N/A  COUNTS:  YES  TOURNIQUET:  * No tourniquets in log *  DICTATION: .Other Dictation: Dictation Number [id number]  PLAN OF CARE: Admit for overnight observation  PATIENT DISPOSITION:  PACU - hemodynamically stable.   Delay start of Pharmacological VTE agent (>24hrs) due to surgical blood loss or risk of bleeding: yes  [name], MD, [facility] Office: [phone]

## 2014-03-16 NOTE — Anesthesia Preprocedure Evaluation (Addendum)
Anesthesia Evaluation  Patient identified by MRN, date of birth, ID band Patient awake    Reviewed: Allergy & Precautions, H&P , NPO status , Patient's Chart, lab work & pertinent test results  Airway Mallampati: II TM Distance: >3 FB Neck ROM: Full    Dental  (+) Edentulous Upper, Edentulous Lower   Pulmonary Current Smoker,  breath sounds clear to auscultation        Cardiovascular negative cardio ROS  Rhythm:Regular Rate:Normal     Neuro/Psych  Headaches, Anxiety    GI/Hepatic negative GI ROS, Neg liver ROS,   Endo/Other  negative endocrine ROS  Renal/GU negative Renal ROS  negative genitourinary   Musculoskeletal negative musculoskeletal ROS (+)   Abdominal   Peds  Hematology negative hematology ROS (+)   Anesthesia Other Findings   Reproductive/Obstetrics negative OB ROS                         Anesthesia Physical Anesthesia Plan  ASA: II  Anesthesia Plan: General   Post-op Pain Management:    Induction: Intravenous  Airway Management Planned: Oral ETT  Additional Equipment:   Intra-op Plan:   Post-operative Plan: Extubation in OR  Informed Consent: I have reviewed the patients History and Physical, chart, labs and discussed the procedure including the risks, benefits and alternatives for the proposed anesthesia with the patient or authorized representative who has indicated his/her understanding and acceptance.     Plan Discussed with: CRNA and Surgeon  Anesthesia Plan Comments:        Anesthesia Quick Evaluation

## 2014-03-16 NOTE — Transfer of Care (Signed)
Immediate Anesthesia Transfer of Care Note  Patient: [name]  Procedure(s) Performed: Procedure(s): LAPAROSCOPIC VENTRAL INCISIONAL HERNIA REPAIR WITH MESH (N/A) INSERTION OF MESH (N/A)  Patient Location: PACU  Anesthesia Type:General  Level of Consciousness: awake and alert   Airway & Oxygen Therapy: Patient Spontanous Breathing and Patient connected to face mask oxygen  Post-op Assessment: Report given to PACU RN and Post -op Vital signs reviewed and stable  Post vital signs: Reviewed and stable  Complications: No apparent anesthesia complications

## 2014-03-17 ENCOUNTER — Encounter (HOSPITAL_COMMUNITY): Payer: Self-pay | Admitting: Surgery

## 2014-03-17 MED ORDER — KETOROLAC TROMETHAMINE 15 MG/ML IJ SOLN
15.0000 mg | Freq: Four times a day (QID) | INTRAMUSCULAR | Status: DC
  Administered 2014-03-17 – 2014-03-18 (×6): 15 mg via INTRAVENOUS
  Filled 2014-03-17 (×10): qty 1

## 2014-03-17 NOTE — Progress Notes (Signed)
Pt refusing to dangle at this time. Says his pain will not be controlled when he moves, but says his pain is under control at this time. Informed patient benefits of dangle and ambulation after surgery. Pt states he is aware, and had education with previous surgeries. Told patient he should dangle when his pain gets better. Will continue to monitor.  [name]

## 2014-03-17 NOTE — Progress Notes (Signed)
UR completed 

## 2014-03-17 NOTE — Progress Notes (Signed)
Patient ID: [name], male   DOB: Dec 01, 1955, 58 y.o.   MRN: [id number]  [facility] - Progress Note  POD# 1  Subjective: Patient with moderate pain.  Has not been OOB yet.  Tolerating liquids.  Objective: Vital signs in last 24 hours: Temp:  [97.5 F (36.4 C)-99.2 F (37.3 C)] 99.2 F (37.3 C) (08/04 0625) Pulse Rate:  [81-97] 91 (08/04 0625) Resp:  [12-21] 18 (08/04 0625) BP: (130-166)/(80-117) 130/84 mmHg (08/04 0625) SpO2:  [90 %-100 %] 90 % (08/04 0625)    Intake/Output from previous day: 08/03 0701 - 08/04 0700 In: 2383.8 [P.O.:1080; I.V.:1303.8] Out: 2200 [Urine:2200]  Exam: HEENT - clear, not icteric Neck - soft Chest - clear bilaterally Cor - RRR, no murmur Abd - dressings dry and intact; binder re-applied Ext - no significant edema Neuro - grossly intact, no focal deficits  Lab Results:  No results found for this basename: WBC, HGB, HCT, PLT,  in the last 72 hours  No results found for this basename: NA, K, CL, CO2, GLUCOSE, BUN, CREATININE, CALCIUM,  in the last 72 hours  Studies/Results: No results found.  Assessment / Plan: 1.  Status post lap ventral incisional hernia repair with mesh  Advance diet as tolerated  OOB to chair, ambulate  Discontinue Foley  Add Toradol for pain control  [name], MD, [facility] Office: [phone]  03/17/2014

## 2014-03-18 DIAGNOSIS — K432 Incisional hernia without obstruction or gangrene: Secondary | ICD-10-CM | POA: Diagnosis not present

## 2014-03-18 DIAGNOSIS — M6200 Separation of muscle (nontraumatic), unspecified site: Secondary | ICD-10-CM | POA: Diagnosis not present

## 2014-03-18 MED ORDER — HYDROCODONE-ACETAMINOPHEN 5-325 MG PO TABS: 1.0000 | ORAL_TABLET | ORAL | Status: DC | PRN

## 2014-03-18 NOTE — Discharge Instructions (Signed)
Laparoscopic Ventral Hernia Repair Laparoscopic ventral hernia repairis a surgery to fix a ventral hernia. Aventral hernia, also called an incisional hernia, is a bulge of body tissue or intestines that pushes through the front part of the abdomen. This can happen if the connective tissue covering the muscles over the abdomen has a weak spot or is torn because of a surgical cut (incision) from a previous surgery. Laparoscopic ventral hernia repair is often done soon after diagnosis to stop the hernia from getting bigger, becoming uncomfortable, or becoming an emergency. This surgery usually takes about 2 hours, but the time can vary greatly. LET [facility] CARE PROVIDER KNOW ABOUT:  Any allergies you have.  All medicines you are taking, including steroids, vitamins, herbs, eye drops, creams, and over-the-counter medicines.  Previous problems you or members of your family have had with the use of anesthetics.  Any blood disorders you have.  Previous surgeries you have had.  Medical conditions you have. RISKS AND COMPLICATIONS  Generally, laparoscopic ventral hernia repair is a safe procedure. However, as with any surgical procedure, problems can occur. Possible problems include:  Bleeding.  Trouble passing urine or having a bowel movement after the surgery.  Infection.  Pneumonia.  Blood clots.  Pain in the area of the hernia.  A bulge in the area of the hernia that may be caused by a collection of fluid.  Injury to intestines or other structures in the abdomen.  Return of the hernia after surgery. In some cases, your health care provider may need to stop the laparoscopic procedure and do regular, open surgery. This may be necessary for very difficult hernias, when organs are hard to see, or when bleeding problems occur during surgery. BEFORE THE PROCEDURE   You may need to have blood tests, urine tests, a chest X-ray, or an electrocardiogram done before the day of the  surgery.  Ask your health care provider about changing or stopping your regular medicines. This is especially important if you are taking diabetes medicines or blood thinners.  You may need to wash with a special type of germ-killing soap.  Do not eat or drink anything after midnight the night before the procedure or as directed by your health care provider.  Make plans to have someone drive you home after the procedure. PROCEDURE   Small monitors will be put on your body. They are used to check your heart, blood pressure, and oxygen level.  An IV access tube will be put into a vein in your hand or arm. Fluids and medicine will flow directly into your body through the IV tube.  You will be given medicine that makes you go to sleep (general anesthetic).  Your abdomen will be cleaned with a special soap to kill any germs on your skin.  Once you are asleep, several small incisions will be made in your abdomen.  The large space in your abdomen will be filled with air so that it expands. This gives your health care provider more room and a better view.  A thin, lighted tube with a tiny camera on the end (laparoscope) is put through a small incision in your abdomen. The camera on the laparoscope sends a picture to a TV screen in the operating room. This gives your health care provider a good view inside your abdomen.  Hollow tubes are put through the other small incisions in your abdomen. The tools needed for the procedure are put through these tubes.  Your health care provider  puts the tissue or intestines that formed the hernia back in place.  A screen-like patch (mesh) is used to close the hernia. This helps make the area stronger. Stitches, tacks, or staples are used to keep the mesh in place.  Medicine and a bandage (dressing) or skin glue will be put over the incisions. AFTER THE PROCEDURE   You will stay in a recovery area until the anesthetic wears off. Your blood pressure and  pulse will be checked often.  You may be able to go home the same day or may need to stay in the hospital for 1-2 days after surgery. Your health care provider will decide when you can go home.  You may feel some pain. You may be given medicine for pain.  You will be urged to do breathing exercises that involve taking deep breaths. This helps prevent a lung infection after a surgery.  You may have to wear compression stockings while you are in the hospital. These stockings help keep blood clots from forming in your legs. Document Released: 07/17/2012 Document Revised: 08/05/2013 Document Reviewed: 07/17/2012 [facility] Patient Information 2015 [place], [place]. This information is not intended to replace advice given to you by your health care provider. Make sure you discuss any questions you have with your health care provider.

## 2014-03-18 NOTE — Progress Notes (Signed)
Patient  Is hemodymatically stable, operative site with no signs of infection, Pain is managed with meds.  Discharge instruction given to patient and family with teach back and verbal understanding patient d/c with family home.

## 2014-03-18 NOTE — Discharge Summary (Signed)
Physician Discharge Summary  Patient ID: [name] MRN: [id number] DOB/AGE: 01/17/1956 58 y.o.  Admit date: 03/16/2014 Discharge date: 03/18/2014  Admission Diagnoses:  Ventral hernia  Discharge Diagnoses:  same  Principal Problem:   Ventral incisional hernia Active Problems:   Incisional hernia   Surgery:  Laparoscopic ventral hernia repair  Discharged Condition: improved  Hospital Course:   Had surgery and did well.  Incisions oK.  Eating.  Ready for discharge on PD 2  Consults: none  Significant Diagnostic Studies: none    Discharge Exam: Blood pressure 143/92, pulse 95, temperature 98.5 F (36.9 C), temperature source Oral, resp. rate 18, height 5\' 9"  (1.753 m), weight 168 lb (76.204 kg), SpO2 90.00%. Incisions bland, velcro binder in place  Disposition: Final discharge disposition not confirmed  Discharge Instructions   Diet - low sodium heart healthy    Complete by:  As directed      Discharge instructions    Complete by:  As directed   Use abdominal binder to keep comfortable     Increase activity slowly    Complete by:  As directed      Remove dressing in 24 hours    Complete by:  As directed   May shower ad lib            Medication List         ALPRAZolam 0.5 MG tablet  Commonly known as:  XANAX  Take 0.5 mg by mouth at bedtime as needed for anxiety.     EXCEDRIN MIGRAINE 250-250-65 MG per tablet  Generic drug:  aspirin-acetaminophen-caffeine  Take 1 tablet by mouth every 6 (six) hours as needed for migraine.     HYDROcodone-acetaminophen 5-325 MG per tablet  Commonly known as:  NORCO/VICODIN  Take 1-2 tablets by mouth every 4 (four) hours as needed for moderate pain.     ibuprofen 200 MG tablet  Commonly known as:  ADVIL,MOTRIN  Take 400 mg by mouth every 6 (six) hours as needed (Pain).     tetrahydrozoline 0.05 % ophthalmic solution  Place 1 drop into both eyes 3 (three) times daily as needed (Red eyes).           Follow-up  Information   Follow up with [name], MD. Schedule an appointment as soon as possible for a visit in 3 weeks.   Specialty:  General Surgery   Contact information:   [address] [place] [address] [phone]       Signed: Pedro Earls 03/18/2014, 6:00 PM

## 2014-03-20 ENCOUNTER — Telehealth (INDEPENDENT_AMBULATORY_CARE_PROVIDER_SITE_OTHER): Payer: Self-pay

## 2014-03-20 NOTE — Telephone Encounter (Signed)
Pt home doing well. PO appt made. 

## 2014-03-25 ENCOUNTER — Telehealth (INDEPENDENT_AMBULATORY_CARE_PROVIDER_SITE_OTHER): Payer: Self-pay | Admitting: *Deleted

## 2014-03-25 DIAGNOSIS — Z9889 Other specified postprocedural states: Principal | ICD-10-CM

## 2014-03-25 DIAGNOSIS — Z8719 Personal history of other diseases of the digestive system: Secondary | ICD-10-CM

## 2014-03-25 MED ORDER — HYDROCODONE-ACETAMINOPHEN 5-325 MG PO TABS: 1.0000 | ORAL_TABLET | ORAL | Status: DC | PRN

## 2014-03-25 NOTE — Telephone Encounter (Signed)
Pt s/p Laparoscopic Ventral Hernia 03-16-14.  Pt called requesting refill of Hydrocodone 5-325.  He rates his pain at a 3 if he is laying flat.  But pt reports that he is trying to get up and move around, and when he does, his pain is at a 7 on a scale from 1-10.  Pt states that he is dong ok.  He is eating well, and having regular bowel movements.  Please advise!  Anderson Malta

## 2014-03-25 NOTE — Addendum Note (Signed)
Addended by: Illene Regulus on: 03/25/2014 04:12 PM   Modules accepted: Orders

## 2014-03-25 NOTE — Telephone Encounter (Signed)
Called pt to notify him that we have a written rx for Norco 5/325mg  #30 ready for p/u at the front desk. Pt will send friend to p/u rx that is on his hippa. Notified for friend to bring photo id.

## 2014-03-30 ENCOUNTER — Encounter (INDEPENDENT_AMBULATORY_CARE_PROVIDER_SITE_OTHER): Payer: Self-pay | Admitting: Surgery

## 2014-03-30 ENCOUNTER — Ambulatory Visit (INDEPENDENT_AMBULATORY_CARE_PROVIDER_SITE_OTHER): Payer: Medicare Other | Admitting: Surgery

## 2014-03-30 VITALS — BP 122/82 | HR 80 | Temp 98.0°F | Resp 18 | Ht 69.0 in | Wt 160.0 lb

## 2014-03-30 DIAGNOSIS — K432 Incisional hernia without obstruction or gangrene: Secondary | ICD-10-CM

## 2014-03-30 NOTE — Progress Notes (Signed)
General Surgery [facility]  Chief Complaint  Patient presents with  . Routine Post Op    lap ventral hernia repair with mesh on 03/16/2014    HISTORY: Patient is a 58 year old male who underwent laparoscopic ventral incisional hernia repair with mesh on 03/16/2014. Postoperatively he did well. He was discharged on the third postoperative day.  Patient states that he is eating a normal diet. He is having normal bowel movements. Pain has slowly continued to improve. He has been wearing his abdominal binder continuously.  EXAM: Surgical wounds are healing without complication. All dressings are removed. Steri-Strips are left in place. No sign of infection. No sign of recurrent hernia.  IMPRESSION: Status post laparoscopic ventral incisional hernia repair with mesh  PLAN: Patient will wear his abdominal binder during the daytime when he is active. I have given him a prescription for another binder if he would like to have a second one.  Patient will return for final wound check in 4 weeks.  [name], MD, [facility]   Visit Diagnoses: 1. Ventral incisional hernia   2. Incisional hernia, without obstruction or gangrene

## 2014-03-30 NOTE — Patient Instructions (Signed)
Wear abdominal binder during the day when active.  You do not need to sleep in the abdominal binder at this time.  You may shower daily.  Removed Steri-Strips in 5-7 days.  [name], MD, [facility] Office: [phone]

## 2014-04-24 DIAGNOSIS — Z23 Encounter for immunization: Secondary | ICD-10-CM | POA: Diagnosis not present

## 2014-04-27 ENCOUNTER — Encounter (INDEPENDENT_AMBULATORY_CARE_PROVIDER_SITE_OTHER): Payer: Medicare Other | Admitting: Surgery

## 2014-10-28 ENCOUNTER — Ambulatory Visit: Payer: Self-pay | Admitting: Surgery

## 2014-10-28 DIAGNOSIS — K4090 Unilateral inguinal hernia, without obstruction or gangrene, not specified as recurrent: Secondary | ICD-10-CM | POA: Diagnosis not present

## 2014-11-24 ENCOUNTER — Ambulatory Visit: Payer: Self-pay | Admitting: Surgery

## 2014-11-24 NOTE — Patient Instructions (Addendum)
[name]  11/24/2014   Your procedure is scheduled on:   12/03/2014    Report to [facility] Main  Entrance and follow signs to               [place] at     [place].  Call this number if you have problems the morning of surgery [phone]   Remember:  Do not eat food or drink liquids :After Midnight.     Take these medicines the morning of surgery with A SIP OF WATER: none                                You may not have any metal on your body including hair pins and              piercings  Do not wear jewelry,, lotions, powders or perfumes., deodorant.                         Men may shave face and neck.   Do not bring valuables to the hospital. [place].  Contacts, dentures or bridgework may not be worn into surgery.      Patients discharged the day of surgery will not be allowed to drive home.  Name and phone number of your driver:  Special Instructions: coughing and deep breathing exercises, leg exercises               Please read over the following fact sheets you were given: _____________________________________________________________________             [facility] - Preparing for Surgery Before surgery, you can play an important role.  Because skin is not sterile, your skin needs to be as free of germs as possible.  You can reduce the number of germs on your skin by washing with CHG (chlorahexidine gluconate) soap before surgery.  CHG is an antiseptic cleaner which kills germs and bonds with the skin to continue killing germs even after washing. Please DO NOT use if you have an allergy to CHG or antibacterial soaps.  If your skin becomes reddened/irritated stop using the CHG and inform your nurse when you arrive at Short Stay. Do not shave (including legs and underarms) for at least 48 hours prior to the first CHG shower.  You may shave your face/neck. Please follow these  instructions carefully:  1.  Shower with CHG Soap the night before surgery and the  morning of Surgery.  2.  If you choose to wash your hair, wash your hair first as usual with your  normal  shampoo.  3.  After you shampoo, rinse your hair and body thoroughly to remove the  shampoo.                           4.  Use CHG as you would any other liquid soap.  You can apply chg directly  to the skin and wash                       Gently with a scrungie or clean washcloth.  5.  Apply the CHG Soap to your  body ONLY FROM THE NECK DOWN.   Do not use on face/ open                           Wound or open sores. Avoid contact with eyes, ears mouth and genitals (private parts).                       Wash face,  Genitals (private parts) with your normal soap.             6.  Wash thoroughly, paying special attention to the area where your surgery  will be performed.  7.  Thoroughly rinse your body with warm water from the neck down.  8.  DO NOT shower/wash with your normal soap after using and rinsing off  the CHG Soap.                9.  Pat yourself dry with a clean towel.            10.  Wear clean pajamas.            11.  Place clean sheets on your bed the night of your first shower and do not  sleep with pets. Day of Surgery : Do not apply any lotions/deodorants the morning of surgery.  Please wear clean clothes to the hospital/surgery center.  FAILURE TO FOLLOW THESE INSTRUCTIONS MAY RESULT IN THE CANCELLATION OF YOUR SURGERY PATIENT SIGNATURE_________________________________  NURSE SIGNATURE__________________________________  ________________________________________________________________________

## 2014-11-24 NOTE — Progress Notes (Signed)
Need clarification to consent form. Abbreviations used. Pts PAT appt is 11/25/2014. Thanks.

## 2014-11-25 ENCOUNTER — Encounter (HOSPITAL_COMMUNITY)
Admission: RE | Admit: 2014-11-25 | Discharge: 2014-11-25 | Disposition: A | Discharge status: Home / Self Care | Payer: Medicare Other | Source: Ambulatory Visit | Attending: Surgery | Admitting: Surgery

## 2014-11-25 ENCOUNTER — Encounter (HOSPITAL_COMMUNITY): Payer: Self-pay

## 2014-11-25 DIAGNOSIS — K4090 Unilateral inguinal hernia, without obstruction or gangrene, not specified as recurrent: Secondary | ICD-10-CM | POA: Diagnosis not present

## 2014-11-25 DIAGNOSIS — Z01812 Encounter for preprocedural laboratory examination: Secondary | ICD-10-CM | POA: Insufficient documentation

## 2014-11-25 HISTORY — DX: Gastro-esophageal reflux disease without esophagitis: K21.9

## 2014-11-25 HISTORY — DX: Anxiety disorder, unspecified: F41.9

## 2014-11-25 HISTORY — DX: Major depressive disorder, single episode, unspecified: F32.9

## 2014-11-25 HISTORY — DX: Depression, unspecified: F32.A

## 2014-11-25 LAB — CBC
HCT: 50.3 % (ref 39.0–52.0)
HEMOGLOBIN: 17.4 g/dL — AB (ref 13.0–17.0)
MCH: 35.4 pg — AB (ref 26.0–34.0)
MCHC: 34.6 g/dL (ref 30.0–36.0)
MCV: 102.2 fL — ABNORMAL HIGH (ref 78.0–100.0)
Platelets: 160 10*3/uL (ref 150–400)
RBC: 4.92 MIL/uL (ref 4.22–5.81)
RDW: 14.8 % (ref 11.5–15.5)
WBC: 8.9 10*3/uL (ref 4.0–10.5)

## 2014-11-25 LAB — BASIC METABOLIC PANEL
Anion gap: 11 (ref 5–15)
BUN: 11 mg/dL (ref 6–23)
CALCIUM: 8.8 mg/dL (ref 8.4–10.5)
CO2: 25 mmol/L (ref 19–32)
CREATININE: 0.87 mg/dL (ref 0.50–1.35)
Chloride: 102 mmol/L (ref 96–112)
GFR calc Af Amer: >90 mL/min (ref >90)
GFR calc non Af Amer: >90 mL/min (ref >90)
GLUCOSE: 99 mg/dL (ref 70–99)
Potassium: 3.7 mmol/L (ref 3.5–5.1)
Sodium: 138 mmol/L (ref 135–145)

## 2014-11-25 NOTE — Progress Notes (Signed)
Patient seen today on preop appointment.  Blood pressure in right arm was 160/106.  Left arm- 158/101.  At end of interview on right arm blood pressure was 145/105 and again in right arm was 168/107.  Allowed patient to rest in quiet dark room for few minutes and blood pressure in left arm was 143/84.  Patient reports a blood pressure of 120/80 per patient.  Patient denies any chest pain, shortness of breath or any dizziness or lightheadedness.  Patient does report some diarrhea over last 1.5 days perhaps due to some strawberries he ate. Patient is scheduled for surgery on 12/03/2014 to have left inguinal hernia repair.  Instructed patient if he has any further problems he should report to PCP- Dr Mertha Finders.  I informed patient that I would let Dr Harlow Asa be aware of blood pressure readings as well as Dr Mertha Finders.

## 2014-11-25 NOTE — Progress Notes (Signed)
CXR- 03/10/2014 EPIC.

## 2014-11-25 NOTE — Progress Notes (Signed)
Quick Note:  These results are acceptable for scheduled surgery.  Avaya Mcjunkins M. Garek Schuneman, MD, FACS [facility] Office: [phone]   ______ 

## 2014-11-25 NOTE — Progress Notes (Signed)
Called and left a voice mail on nurse of Dr Marcellus Scott regarding blood pressure readings for today at preop appointment and that patient denied any dizziness, lightheadedness chest pain or shortness of breath at time of preop appointment .  Patient only reports diarrhea x 1.5 days per patient after eating strawberries and that he did not sleep well last nite over anticipation of upcoming surgery on 12/03/2014.

## 2014-11-25 NOTE — Progress Notes (Signed)
Blood pressure in right arm 163/93 upon discharge from preop appointment.

## 2014-12-01 NOTE — Op Note (Signed)
PATIENT NAME:  [name], [name] MR#:  [id number] DATE OF BIRTH:  08-06-1956  DATE OF PROCEDURE:  06/13/2012  PREOPERATIVE DIAGNOSIS: Pelvic abscess.   POSTOPERATIVE DIAGNOSIS: Pelvic abscess, probably perforated diverticulitis.   SURGERY:  1. Placement right internal jugular venous catheter.  2. Exploratory laparotomy, drainage of pelvic abscess.  3. Hartmann's procedure with sigmoid resection and colostomy.  4. Small bowel resection.   SURGEON: Rodena Goldmann, III, MD   ASSISTANT: Nestor Lewandowsky MD   ANESTHESIA: General.  OPERATIVE PROCEDURE: With the patient in the supine position after the induction of appropriate general anesthesia, the patient's right neck was prepped and draped with Betadine and draped with sterile towels. Using the vascular ultrasound, the internal jugular vein was identified and cannulated on a single pass and a wire passed into the great vessel system without difficulty. The tract was dilated and the dilator exchanged for a triple lumen catheter which was secured in place, flushed, and sterilely dressed.   The patient's abdomen was then prepped with ChloraPrep and draped with sterile towels. A midline incision was made just above the umbilicus to the suprapubic area and carried down through the subcutaneous tissue with Bovie electrocautery. Midline fascia was identified and opened the length of the skin incision as was the peritoneum. There was no obvious inflammation on access to the abdominal cavity. There was a large matted segment of bowel in the pelvis from just above the bladder to the umbilicus. The bowel was carefully separated away and a large foul-smelling abscess encountered. The abscess appeared to have multiple loculations and drained approximately 750 mL of creamy purulent material. The area was copiously irrigated. Bowel loops were then removed from the abscess cavity and individually examined. The small bowel was run from the ligament of Treitz to the right  colon. There did not appear to be any evidence of any abnormality with the exception of a 10 cm segment in the distal ileum. There was not any obvious perforation but there did appear to be significant inflammatory change where this piece of bowel and omentum lay on the dome of the abscess. It was impossible to tell the site of the perforation. The sigmoid colon was identified and again in the same sense was completely encased in inflammatory change and impossible to identify a source of the infection. In this gentleman's age group, I assumed this was a diverticular problem although there was not any clear evidence of diverticulitis or diverticulosis. The bowel was identified distally just above the pelvic floor and the bowel divided with a single application of the contour stapling device. The mesentery was taken down using LigaSure apparatus. The inferior mesenteric artery was oversewn with 3-0 silk. The specimen was taken up past the sigmoid curvature and then the splenic flexure was taken down to the distal transverse colon. The mesentery was divided with the LigaSure up to approximately the end of the descending colon. The bowel appeared to be viable in this area and divided with the contour device and specimen passed off the table. Because of the difficulty identifying the source of the infection and the fact that the 10 cm of small bowel were significantly abnormal, this area was also chosen for resection. GIA-55 stapling device was loaded across each of the proximal and distal bowel loops and divided. The mesentery was taken down with the LigaSure device. A small enterotomy was made in each loop of bowel and a GIA-55 stapling device inserted one limb into each loop of bowel. The staples  were approximated and fired. Resulting enterotomy was closed with two applications of the YW-73 stapling device carrying a blue load. Seromuscular sutures were placed over the confluence of the staple lines to ensure  stability. Mesenteric defect was closed with running suture of 3-0 Vicryl. The bowel contents were returned to their anatomic position. The abdomen was then irrigated with multiple liters of warm saline solution.   The abdominal wall was examined and a site chosen for the colostomy. A circle of skin was taken and the subcutaneous tissue divided to expose the anterior fascia. The rectus fascia was incised in a cruciate manner and then the muscle separated bluntly. Posterior fascia was divided in a cruciate manner. The area chosen for the ostomy was brought through the abdominal wall without difficulty. It appeared to fit well with no evidence of any ischemia and it appeared to move freely with no evidence of a kink or a twist. The abdomen was then irrigated and then the fascia closed using a running doubled PDS suture one suture from each end tying in the middle and burying the knot. Skin was loosely closed with skin clips. The colostomy was matured with 3-0 and 4-0 Vicryl and appeared to be viable. It sagged a bit with the patient's respiration and was flushed rather than protruding as it had been preclosure. A Wound VAC was placed and ostomy bag placed over top of the Wound VAC. The patient was awakened and returned to the recovery room in critical condition.  ____________________________ Rodena Goldmann III, MD rle:drc D: 06/13/2012 18:36:03 ET T: 06/14/2012 06:28:29 ET JOB#: [id number] Rodena Goldmann MD ELECTRONICALLY SIGNED 06/14/2012 18:15

## 2014-12-01 NOTE — Discharge Summary (Signed)
PATIENT NAME:  [name], ROYSE MR#:  [id number] DATE OF BIRTH:  1955-12-27  DATE OF ADMISSION:  06/12/2012 DATE OF DISCHARGE:  06/23/2012  BRIEF HISTORY: [name] is a 59 year old gentleman admitted through the Emergency Room on October 30th with a three to four week history of increasing abdominal discomfort. He had an approximately 20 pound weight loss. He had work-up in the Emergency Room that demonstrated a large multiloculated pelvic abscess. His white blood cell count was 23,000, platelet count normal, potassium 2.3, and sodium of 133. We felt the clinical imaging picture suggested possible ruptured diverticulitis. We corrected his electrolytes and took him to surgery on the afternoon of 06/13/2012 where he underwent Hartmann's procedure with sigmoid resection, colostomy, exploratory laparotomy, drainage of pelvic abscess, small bowel resection. He had significant abnormality in the pelvis with almost a liter of purulent material noted. This appeared to be a perforated diverticulitis. Hartmann's procedure was performed without difficulty. Postoperatively he had some mild wound problems. A Wound VAC was utilized to control abdominal drainage. He had very slow return of bowel function but was eventually advanced to a regular diet and tolerated it well with good ostomy function. He was discharged home on the 10th to be followed in the office in 7 to 10 days' time.   DISCHARGE MEDICATIONS:  1. Pepcid 20 mg p.o. b.i.d.  2. Morphine extended-release. 3. Morphine immediate-release.  4. Magnesium oxide.   FINAL DISCHARGE DIAGNOSIS: Perforated diverticulitis with pelvic abscess.   SURGERY:  1. Exploratory laparotomy. 2. Hartmann's procedure. 3. Small bowel resection.    ____________________________ Rodena Goldmann III, MD rle:drc D: 06/27/2012 23:05:13 ET T: 06/28/2012 10:34:34 ET JOB#: [id number] Rodena Goldmann MD ELECTRONICALLY SIGNED 07/06/2012 21:29

## 2014-12-01 NOTE — H&P (Signed)
PATIENT NAME:  [name] MR#:  [id number] DATE OF BIRTH:  04/12/1956  DATE OF ADMISSION:  06/12/2012  PRIMARY CARE PHYSICIAN: [facility]   ADMITTING PHYSICIAN: Dr. Pat Patrick  CHIEF COMPLAINT: Abdominal pain.   BRIEF HISTORY: [name] is a 59 year old gentleman seen in the Emergency Room with a 3 to 4 week history of abdominal pain. The patient was in his usual state of health when he developed an episode of significant diarrhea. He took some antidiarrheal medications and developed a case constipation then following that developed significant abdominal pain in the pelvic and lower abdomen areas. Pain persisted over the last several weeks. He has had low-grade fever, no chills, significant anorexia, no vomiting. He has lost approximately 20 pounds in 3 to 4 weeks because he just has not eaten. He has had no problems voiding. His bowel function has been intermittent and irregular. He did have some hemorrhoidal symptoms with a small amount of blood in his stool and went to his primary care physician. They treated him with suppositories. The patient mentioned his abdominal pain and was recently placed on Cipro and Flagyl for possible diverticulitis. No imaging was performed.   The patient denies any previous similar symptoms. The patient denies history of hepatitis, yellow jaundice, pancreatitis, peptic ulcer disease, gallbladder disease or previous diagnosis of diverticulitis. Patient has no other abdominal surgery. Did have a hernia repair as a child. He has had bilateral hip replacements. He denies any cardiac disease. He is hypertensive but denies diabetes. He is a cigarette smoker. Drinks alcohol occasionally and is disabled currently.   REVIEW OF SYSTEMS: 10 point review of systems is undertaken with the patient is unremarkable.   FAMILY HISTORY: Noncontributory.   CURRENT MEDICATIONS: In addition to the Cipro and Flagyl includes hydrochlorothiazide.   PHYSICAL EXAMINATION:   GENERAL: He is a pale washed out, uncomfortable gentleman in no significant distress.   VITALS: Blood pressure 106/70, heart rate 110 and regular, temperature 98.7, oxygen saturation 98%. Reports his pain at a 10.   HEENT: Unremarkable. He has no scleral icterus and no pupillary abnormalities. He has no facial deformities.   NECK: Supple, nontender with no adenopathy and normal midline trachea.   PULMONARY: Chest is clear with distant breath sounds but no adventitious sounds. He has normal pulmonary excursion.   CARDIAC: No murmurs or gallops to my ear. He seems to be in normal sinus rhythm.   ABDOMEN: His abdomen is mildly distended but nontender from the umbilicus down with mild guarding in both lower quadrants and suprapubically but no rebound tenderness. No masses are noted. He has hypoactive but present bowel sounds.   LOWER EXTREMITIES: Some diminished range of motion because of his hip disease but no deformities and good distal pulses.   PSYCHIATRIC: Normal affect and normal orientation.   LABORATORY, DIAGNOSTIC AND RADIOLOGICAL DATA: I have independently reviewed his CT scan which demonstrates a large multiloculated pelvic abscess. Appendix is not well visualized. Does not appear to be any evidence of significant diverticulosis. There is some small bowel thickening and multiple loops as there did appear to be some interloop abscesses. Those films were reviewed with the radiologist. White blood cell count 23,000, hemoglobin 17.9, platelet count is normal. Electrolytes reveal creatinine 1.02, sodium 133, potassium 2.3, chloride 93. Liver function studies were largely unremarkable. Albumin is depressed at 2.7, magnesium 1.6.   IMPRESSION: This gentleman appears to have a large pelvic abscess, etiology unknown. His electrolytes are significantly abnormal. Will admit him to  the hospital, place him on fluids and electrolyte repletion. I have discussed the possibility of percutaneous drainage  with the radiologist but in view of the fact we have multiple abscesses and interloop abscess I am concerned about adequate drainage. The best window for percutaneous drainage would be near one of his hip replacements and I am also concerned about potential contamination. Fistula formation is other option. I am concerned about the illness going on for a month and am concerned that this may represent a perforated diverticulitis or perforated appendicitis. We will try to rehydrate him and affect his electrolyte repletion and then consider possible surgical intervention. This plan has been outlined to the patient and they are in agreement.   ____________________________ Micheline Maze, MD rle:cms D: 06/12/2012 18:02:11 ET T: 06/13/2012 05:39:11 ET JOB#: [id number]  cc: Rodena Goldmann III, MD, <Dictator> Rodena Goldmann MD ELECTRONICALLY SIGNED 06/14/2012 18:14

## 2014-12-01 NOTE — Consult Note (Signed)
PATIENT NAME:  [name], HOOS MR#:  [id number] DATE OF BIRTH:  June 22, 1956  DATE OF CONSULTATION:  06/13/2012  REFERRING PHYSICIAN:  Dr. Pat Patrick of surgery  CONSULTING PHYSICIAN:  Awa Bachicha R. Liya Strollo, MD  PRIMARY CARE PHYSICIAN: Dr. Seward Carol   REASON FOR CONSULTATION: Intubated patient status post surgery for pelvic abscess.   HISTORY OF PRESENTING ILLNESS: 60 year old Caucasian male patient with history of hypertension who presented to the hospital with 3 to 4 weeks of abdominal pain. He also had some diarrhea, bloody stools, was found to have multilocular pelvic abscess. Patient was started on ciprofloxacin, metronidazole, later changed to Invanz and IV fluids. Patient was taken to the OR today, had Hartman's procedure with colon resection, presently has a wound VAC on his abdominal wound. Patient was seen in the PAC-U with blood pressure 175/84. I have discussed the case with Dr. Ronelle Nigh of anesthesiology. Patient has received 3.5 liter of IV fluids. He seems to have lost 1 to 1.5 liters of serosanguineous pus along with blood during the surgery. Patient is intubated on 50% oxygen saturating 100% at this time.   PAST MEDICAL HISTORY: Hypertension.    SOCIAL HISTORY: Unobtainable.   HOME MEDICATIONS: Hydrochlorothiazide 25 once a day.   ALLERGIES: No known drug allergies.   REVIEW OF SYSTEMS: Unobtainable as patient is on a mechanical ventilator and sedated.   FAMILY HISTORY: Unobtainable.   PHYSICAL EXAMINATION:  VITAL SIGNS: Temperature 98.1, pulse 81, blood pressure 78/52, saturating 100% on mechanical ventilator.   GENERAL: Moderately built Caucasian male patient lying in bed, intubated, wakes up on calling his name. Moves all four extremities. Follows commands.   HEENT: ET tube in place. No pallor. No icterus. Pupils are equal and reactive to light. Moves extraocular muscles.   CARDIOVASCULAR: S1, S2, regular rate and rhythm without any murmurs.   RESPIRATORY: Normal work of  breathing. Clear to auscultation on both sides.   GASTROINTESTINAL: Has a large abdominal wound with a wound VAC in place. Bowel sounds absent. Tenderness positive diffusely.   SKIN: Abdominal wound. No other ulcers or rash found.   MUSCULOSKELETAL: No joint swelling, redness, effusion of the large joints.   LABORATORY, DIAGNOSTIC AND RADIOLOGICAL DATA: Laboratory studies show BUN 21, creatinine 0.96, potassium 4.8, potassium was 2.3 on admission, albumin 1.9.   WBC 23.3, decreased to 17.9, hemoglobin 14.4, platelets 334, neutrophils 82%. Blood cultures negative to date.   CT scan of the abdomen and pelvis with contrast showed large pelvic abscess with air-fluid levels interspersed within loops of bowel.   Chest x-ray showed no acute cardiopulmonary disease.   ASSESSMENT AND PLAN:  1. Pelvic abscess. Patient had surgery and large amount of pus was removed, presently has abdominal wound VAC. Management as per Dr. Pat Patrick and team.  2. Sepsis with tachycardia, elevated white count present on admission. Presently white count is elevated. Patient is hypotensive. Will need aggressive IV fluid resuscitation. Agree with Colbert Ewing which patient is on which should cover gram negatives and anaerobes.  3. Independent respiratory failure. Patient has had surgery, still is on 50% oxygen. Will continue the ventilator support, wean off oxygen. Sedation with p.r.n. Versed and pain control with fentanyl. Will try spontaneous breathing trial tomorrow morning depending on ventilator status.  4. Hypertension. Patient is presently hypotensive secondary to sepsis. His hydrochlorothiazide has been held.  5. Deep vein thrombosis prophylaxis with Lovenox.  6. CODE STATUS: Presumed FULL CODE.   TIME SPENT: Time spent today on this case on a ventilator with sepsis,  hypotension secondary to large pelvic abscess was 45 minutes.   ____________________________ Leia Alf Malcolm Quast, MD srs:cms D: 06/13/2012 20:11:54  ET T: 06/14/2012 06:37:48 ET JOB#: [id number]  cc: Alveta Heimlich R. Sagal Gayton, MD, <Dictator> Dr. Seward Carol  Neita Carp MD ELECTRONICALLY SIGNED 06/14/2012 13:29

## 2014-12-03 ENCOUNTER — Ambulatory Visit (HOSPITAL_COMMUNITY): Payer: Medicare Other | Admitting: Anesthesiology

## 2014-12-03 ENCOUNTER — Encounter (HOSPITAL_COMMUNITY): Payer: Self-pay | Admitting: *Deleted

## 2014-12-03 ENCOUNTER — Encounter (HOSPITAL_COMMUNITY)
Admission: RE | Disposition: A | Discharge status: Home / Self Care | Payer: Self-pay | Source: Ambulatory Visit | Attending: Surgery

## 2014-12-03 ENCOUNTER — Ambulatory Visit (HOSPITAL_COMMUNITY)
Admission: RE | Admit: 2014-12-03 | Discharge: 2014-12-03 | Disposition: A | Discharge status: Home / Self Care | Payer: Medicare Other | Source: Ambulatory Visit | Attending: Surgery | Admitting: Surgery

## 2014-12-03 DIAGNOSIS — Z791 Long term (current) use of non-steroidal anti-inflammatories (NSAID): Secondary | ICD-10-CM | POA: Diagnosis not present

## 2014-12-03 DIAGNOSIS — R51 Headache: Secondary | ICD-10-CM | POA: Insufficient documentation

## 2014-12-03 DIAGNOSIS — Z79899 Other long term (current) drug therapy: Secondary | ICD-10-CM | POA: Diagnosis not present

## 2014-12-03 DIAGNOSIS — F17200 Nicotine dependence, unspecified, uncomplicated: Secondary | ICD-10-CM | POA: Diagnosis not present

## 2014-12-03 DIAGNOSIS — K4091 Unilateral inguinal hernia, without obstruction or gangrene, recurrent: Secondary | ICD-10-CM | POA: Diagnosis not present

## 2014-12-03 DIAGNOSIS — F419 Anxiety disorder, unspecified: Secondary | ICD-10-CM | POA: Diagnosis not present

## 2014-12-03 DIAGNOSIS — I10 Essential (primary) hypertension: Secondary | ICD-10-CM | POA: Insufficient documentation

## 2014-12-03 DIAGNOSIS — K4090 Unilateral inguinal hernia, without obstruction or gangrene, not specified as recurrent: Secondary | ICD-10-CM | POA: Diagnosis not present

## 2014-12-03 DIAGNOSIS — M1990 Unspecified osteoarthritis, unspecified site: Secondary | ICD-10-CM | POA: Diagnosis not present

## 2014-12-03 DIAGNOSIS — K219 Gastro-esophageal reflux disease without esophagitis: Secondary | ICD-10-CM | POA: Diagnosis not present

## 2014-12-03 HISTORY — PX: INSERTION OF MESH: SHX5868

## 2014-12-03 HISTORY — PX: INGUINAL HERNIA REPAIR: SHX194

## 2014-12-03 SURGERY — REPAIR, HERNIA, INGUINAL, ADULT
Anesthesia: General | Site: Groin | Laterality: Left

## 2014-12-03 MED ORDER — SUCCINYLCHOLINE CHLORIDE 20 MG/ML IJ SOLN
INTRAMUSCULAR | Status: DC | PRN
  Administered 2014-12-03: 100 mg via INTRAVENOUS

## 2014-12-03 MED ORDER — MIDAZOLAM HCL 5 MG/5ML IJ SOLN
INTRAMUSCULAR | Status: DC | PRN
  Administered 2014-12-03: 2 mg via INTRAVENOUS

## 2014-12-03 MED ORDER — FENTANYL CITRATE (PF) 250 MCG/5ML IJ SOLN
INTRAMUSCULAR | Status: AC
  Filled 2014-12-03: qty 5

## 2014-12-03 MED ORDER — ONDANSETRON HCL 4 MG/2ML IJ SOLN
INTRAMUSCULAR | Status: AC
  Filled 2014-12-03: qty 2

## 2014-12-03 MED ORDER — NEOSTIGMINE METHYLSULFATE 10 MG/10ML IV SOLN
INTRAVENOUS | Status: DC | PRN
Start: 2014-12-03 — End: 2014-12-03
  Administered 2014-12-03: 4 mg via INTRAVENOUS

## 2014-12-03 MED ORDER — ROCURONIUM BROMIDE 100 MG/10ML IV SOLN
INTRAVENOUS | Status: AC
  Filled 2014-12-03: qty 1

## 2014-12-03 MED ORDER — PROMETHAZINE HCL 25 MG/ML IJ SOLN: 6.2500 mg | INTRAMUSCULAR | Status: DC | PRN

## 2014-12-03 MED ORDER — LIDOCAINE HCL (CARDIAC) 20 MG/ML IV SOLN
INTRAVENOUS | Status: DC | PRN
  Administered 2014-12-03: 100 mg via INTRAVENOUS

## 2014-12-03 MED ORDER — PROPOFOL 10 MG/ML IV BOLUS
INTRAVENOUS | Status: AC
  Filled 2014-12-03: qty 20

## 2014-12-03 MED ORDER — DEXAMETHASONE SODIUM PHOSPHATE 10 MG/ML IJ SOLN
INTRAMUSCULAR | Status: DC | PRN
  Administered 2014-12-03: 10 mg via INTRAVENOUS

## 2014-12-03 MED ORDER — MEPERIDINE HCL 50 MG/ML IJ SOLN: 6.2500 mg | INTRAMUSCULAR | Status: DC | PRN

## 2014-12-03 MED ORDER — FENTANYL CITRATE (PF) 100 MCG/2ML IJ SOLN
INTRAMUSCULAR | Status: DC | PRN
  Administered 2014-12-03: 100 ug via INTRAVENOUS
  Administered 2014-12-03 (×3): 50 ug via INTRAVENOUS

## 2014-12-03 MED ORDER — OXYCODONE HCL 5 MG PO TABS: 5.0000 mg | ORAL_TABLET | Freq: Four times a day (QID) | ORAL | Status: DC | PRN

## 2014-12-03 MED ORDER — FENTANYL CITRATE (PF) 100 MCG/2ML IJ SOLN
INTRAMUSCULAR | Status: DC
Start: 2014-12-03 — End: 2014-12-03
  Filled 2014-12-03: qty 2

## 2014-12-03 MED ORDER — KETOROLAC TROMETHAMINE 30 MG/ML IJ SOLN
30.0000 mg | Freq: Once | INTRAMUSCULAR | Status: AC
  Administered 2014-12-03: 30 mg via INTRAVENOUS
  Filled 2014-12-03: qty 1

## 2014-12-03 MED ORDER — ROCURONIUM BROMIDE 100 MG/10ML IV SOLN
INTRAVENOUS | Status: DC | PRN
  Administered 2014-12-03: 30 mg via INTRAVENOUS

## 2014-12-03 MED ORDER — 0.9 % SODIUM CHLORIDE (POUR BTL) OPTIME
TOPICAL | Status: DC | PRN
  Administered 2014-12-03: 1000 mL

## 2014-12-03 MED ORDER — CEFAZOLIN SODIUM-DEXTROSE 2-3 GM-% IV SOLR
INTRAVENOUS | Status: AC
  Filled 2014-12-03: qty 50

## 2014-12-03 MED ORDER — GLYCOPYRROLATE 0.2 MG/ML IJ SOLN
INTRAMUSCULAR | Status: DC | PRN
  Administered 2014-12-03: 0.6 mg via INTRAVENOUS

## 2014-12-03 MED ORDER — ONDANSETRON HCL 4 MG/2ML IJ SOLN
INTRAMUSCULAR | Status: DC | PRN
  Administered 2014-12-03: 4 mg via INTRAVENOUS

## 2014-12-03 MED ORDER — CEFAZOLIN SODIUM-DEXTROSE 2-3 GM-% IV SOLR
2.0000 g | INTRAVENOUS | Status: AC
  Administered 2014-12-03: 2 g via INTRAVENOUS

## 2014-12-03 MED ORDER — MIDAZOLAM HCL 2 MG/2ML IJ SOLN
INTRAMUSCULAR | Status: AC
  Filled 2014-12-03: qty 2

## 2014-12-03 MED ORDER — LIDOCAINE HCL (CARDIAC) 20 MG/ML IV SOLN
INTRAVENOUS | Status: AC
  Filled 2014-12-03: qty 5

## 2014-12-03 MED ORDER — FENTANYL CITRATE (PF) 100 MCG/2ML IJ SOLN
25.0000 ug | INTRAMUSCULAR | Status: DC | PRN
  Administered 2014-12-03 (×2): 50 ug via INTRAVENOUS

## 2014-12-03 MED ORDER — PHENYLEPHRINE HCL 10 MG/ML IJ SOLN
INTRAMUSCULAR | Status: DC | PRN
  Administered 2014-12-03 (×2): 80 ug via INTRAVENOUS

## 2014-12-03 MED ORDER — PHENYLEPHRINE 40 MCG/ML (10ML) SYRINGE FOR IV PUSH (FOR BLOOD PRESSURE SUPPORT)
PREFILLED_SYRINGE | INTRAVENOUS | Status: AC
  Filled 2014-12-03: qty 10

## 2014-12-03 MED ORDER — LACTATED RINGERS IV SOLN: INTRAVENOUS | Status: DC

## 2014-12-03 MED ORDER — PROPOFOL 10 MG/ML IV BOLUS
INTRAVENOUS | Status: DC | PRN
  Administered 2014-12-03: 180 mg via INTRAVENOUS

## 2014-12-03 MED ORDER — DEXAMETHASONE SODIUM PHOSPHATE 10 MG/ML IJ SOLN
INTRAMUSCULAR | Status: AC
  Filled 2014-12-03: qty 1

## 2014-12-03 MED ORDER — OXYCODONE HCL 5 MG PO TABS
5.0000 mg | ORAL_TABLET | Freq: Four times a day (QID) | ORAL | Status: DC | PRN
  Administered 2014-12-03: 10 mg via ORAL
  Filled 2014-12-03: qty 2

## 2014-12-03 MED ORDER — BUPIVACAINE-EPINEPHRINE (PF) 0.25% -1:200000 IJ SOLN
INTRAMUSCULAR | Status: DC | PRN
  Administered 2014-12-03: 20 mL

## 2014-12-03 MED ORDER — BUPIVACAINE-EPINEPHRINE (PF) 0.25% -1:200000 IJ SOLN
INTRAMUSCULAR | Status: AC
  Filled 2014-12-03: qty 30

## 2014-12-03 MED ORDER — LACTATED RINGERS IV SOLN
INTRAVENOUS | Status: DC | PRN
  Administered 2014-12-03 (×2): via INTRAVENOUS

## 2014-12-03 SURGICAL SUPPLY — 40 items
APPLICATOR COTTON TIP 6IN STRL (MISCELLANEOUS) IMPLANT
BENZOIN TINCTURE PRP APPL 2/3 (GAUZE/BANDAGES/DRESSINGS) ×3 IMPLANT
BLADE HEX COATED 2.75 (ELECTRODE) ×3 IMPLANT
BLADE SURG 15 STRL LF DISP TIS (BLADE) ×1 IMPLANT
BLADE SURG 15 STRL SS (BLADE) ×2
BLADE SURG SZ10 CARB STEEL (BLADE) IMPLANT
CHLORAPREP W/TINT 26ML (MISCELLANEOUS) ×3 IMPLANT
CLOSURE WOUND 1/2 X4 (GAUZE/BANDAGES/DRESSINGS) ×1
DECANTER SPIKE VIAL GLASS SM (MISCELLANEOUS) IMPLANT
DRAIN PENROSE 18X1/2 LTX STRL (DRAIN) ×3 IMPLANT
DRAPE LAPAROTOMY TRNSV 102X78 (DRAPE) ×3 IMPLANT
ELECT REM PT RETURN 9FT ADLT (ELECTROSURGICAL) ×3
ELECTRODE REM PT RTRN 9FT ADLT (ELECTROSURGICAL) ×1 IMPLANT
GAUZE SPONGE 4X4 12PLY STRL (GAUZE/BANDAGES/DRESSINGS) ×3 IMPLANT
GLOVE BIOGEL PI IND STRL 7.0 (GLOVE) ×3 IMPLANT
GLOVE BIOGEL PI IND STRL 7.5 (GLOVE) ×1 IMPLANT
GLOVE BIOGEL PI INDICATOR 7.0 (GLOVE) ×6
GLOVE BIOGEL PI INDICATOR 7.5 (GLOVE) ×2
GLOVE SURG ORTHO 8.0 STRL STRW (GLOVE) ×3 IMPLANT
GLOVE SURG SS PI 6.5 STRL IVOR (GLOVE) ×3 IMPLANT
GLOVE SURG SS PI 7.5 STRL IVOR (GLOVE) ×3 IMPLANT
GOWN STRL REUS W/TWL LRG LVL3 (GOWN DISPOSABLE) ×3 IMPLANT
GOWN STRL REUS W/TWL XL LVL3 (GOWN DISPOSABLE) ×6 IMPLANT
KIT BASIN OR (CUSTOM PROCEDURE TRAY) ×3 IMPLANT
MESH ULTRAPRO 3X6 7.6X15CM (Mesh General) ×3 IMPLANT
NEEDLE HYPO 25X1 1.5 SAFETY (NEEDLE) ×3 IMPLANT
NS IRRIG 1000ML POUR BTL (IV SOLUTION) ×3 IMPLANT
PACK BASIC VI WITH GOWN DISP (CUSTOM PROCEDURE TRAY) ×3 IMPLANT
PENCIL BUTTON HOLSTER BLD 10FT (ELECTRODE) ×3 IMPLANT
SPONGE LAP 4X18 X RAY DECT (DISPOSABLE) ×9 IMPLANT
STRIP CLOSURE SKIN 1/2X4 (GAUZE/BANDAGES/DRESSINGS) ×2 IMPLANT
SUT MNCRL AB 4-0 PS2 18 (SUTURE) ×3 IMPLANT
SUT NOVA NAB GS-22 2 0 T19 (SUTURE) ×6 IMPLANT
SUT SILK 2 0 SH (SUTURE) ×3 IMPLANT
SUT VIC AB 3-0 SH 18 (SUTURE) ×3 IMPLANT
SYR BULB IRRIGATION 50ML (SYRINGE) ×3 IMPLANT
SYR CONTROL 10ML LL (SYRINGE) ×3 IMPLANT
TAPE CLOTH SURG 4X10 WHT LF (GAUZE/BANDAGES/DRESSINGS) ×3 IMPLANT
TOWEL OR 17X26 10 PK STRL BLUE (TOWEL DISPOSABLE) ×3 IMPLANT
YANKAUER SUCT BULB TIP 10FT TU (MISCELLANEOUS) ×3 IMPLANT

## 2014-12-03 NOTE — Progress Notes (Signed)
I have attempted to get patient out of bed x 2.  Patient refuses to get up, he states "it will hurt too much, I don't want to hurt"  I have encouraged patient to try and start moving so that he will feel better and not to get stiff. Pt has been informed benefits of getting out of bed and walking.  Pt still refuses to get up.

## 2014-12-03 NOTE — Anesthesia Preprocedure Evaluation (Addendum)
Anesthesia Evaluation  Patient identified by MRN, date of birth, ID band Patient awake    Reviewed: Allergy & Precautions, H&P , NPO status , Patient's Chart, lab work & pertinent test results  Airway Mallampati: II  TM Distance: >3 FB Neck ROM: Full    Dental  (+) Edentulous Upper, Edentulous Lower   Pulmonary Current Smoker,  breath sounds clear to auscultation        Cardiovascular negative cardio ROS  Rhythm:Regular Rate:Normal     Neuro/Psych  Headaches, Anxiety    GI/Hepatic negative GI ROS, Neg liver ROS,   Endo/Other  negative endocrine ROS  Renal/GU negative Renal ROS  negative genitourinary   Musculoskeletal negative musculoskeletal ROS (+)   Abdominal   Peds  Hematology negative hematology ROS (+)   Anesthesia Other Findings   Reproductive/Obstetrics negative OB ROS                            Anesthesia Physical  Anesthesia Plan  ASA: II  Anesthesia Plan: General   Post-op Pain Management:    Induction: Intravenous  Airway Management Planned: Oral ETT and LMA  Additional Equipment:   Intra-op Plan:   Post-operative Plan: Extubation in OR  Informed Consent: I have reviewed the patients History and Physical, chart, labs and discussed the procedure including the risks, benefits and alternatives for the proposed anesthesia with the patient or authorized representative who has indicated his/her understanding and acceptance.     Plan Discussed with: CRNA and Surgeon  Anesthesia Plan Comments:        Anesthesia Quick Evaluation

## 2014-12-03 NOTE — Transfer of Care (Signed)
Immediate Anesthesia Transfer of Care Note  Patient: [name]  Procedure(s) Performed: Procedure(s): LEFT INGUINAL HERNIA REPAIR WITH MESH (Left) INSERTION OF MESH (Left)  Patient Location: PACU  Anesthesia Type:General  Level of Consciousness: sedated  Airway & Oxygen Therapy: Patient Spontanous Breathing and Patient connected to face mask oxygen  Post-op Assessment: Report given to RN and Post -op Vital signs reviewed and stable  Post vital signs: Reviewed and stable  Last Vitals:  Filed Vitals:   12/03/14 0605  BP: 142/92  Pulse:   Temp:   Resp:     Complications: No apparent anesthesia complications

## 2014-12-03 NOTE — Op Note (Signed)
Inguinal Hernia, Open, Procedure Note  Pre-operative Diagnosis:  Left inguinal hernia, reducible  Post-operative Diagnosis: same  Surgeon:  [name], MD, FACS  Anesthesia:  General  Preparation:  Chlora-prep  Estimated Blood Loss: Minimal  Complications:  none  Indications: The patient presented with a left, reducible hernia.  The patient is a 59 year old male who presents with an inguinal hernia. Patient returns for follow-up of laparoscopic ventral hernia repair performed in August 2015. Patient also presents today with a new complaint of left inguinal hernia. Patient has noted a bulge in the left groin. This causes discomfort with different physical activities. He denies any signs or symptoms of obstruction. It is reducible when he lays recumbent. Patient has had a previous right inguinal hernia repair as a child.   Procedure Details  The patient was evaluated in the holding area. All of the patient's questions were answered and the proposed procedure was confirmed. The site of the procedure was properly marked. The patient was taken to the Operating Room, identified by name, and the procedure verified as inguinal hernia repair.  The patient was placed in the supine position and underwent induction of anesthesia. A "Time Out" was performed per routine. The lower abdomen and groin were prepped and draped in the usual aseptic fashion.  After ascertaining that an adequate level of anesthesia had been obtained, an incision was made in the groin with a #10 blade.  Dissection was carried through the subcutaneous tissues and hemostasis obtained with the electrocautery.  A Gelpi retractor was placed for exposure.  The external oblique fascia was incised in line with it's fibers and extended through the external inguinal ring.  The cord structures were dissected out of the inguinal canal and encircled with a Penrose drain.  The floor of the inguinal canal was dissected out.  There was  laxity in the floor but no direct defect.  The cord was explored and a moderate indirect hernia sac containing omentum was dissected out.  A high ligation was performed with 2-0 silk ligature and the sac excised and discarded.  The floor of the inguinal canal was reconstructed with Ethicon Ultrapro mesh cut to the appropriate dimensions.  It was secured to the pubic tubercle with a 2-0 Novafil suture and along the inguinal ligament with a running 2-0 Novafil suture.  Mesh was split to accommodate the cord structures.  The superior margin of the mesh was secured to the transversalis and internal oblique musculature with interrupted 2-0 Novafil sutures.  The tails of the mesh were overlapped lateral to the cord structures and secured to the inguinal ligament with interrupted 2-0 Novafil sutures to recreate the internal inguinal ring.  Cord structures were returned to the inguinal canal.  Local anesthetic was infiltrated throughout the field.  External oblique fascia was closed with interrupted 3-0 Vicryl sutures.  Subcutaneous tissues were closed with interrupted 3-0 Vicryl sutures.  Skin was anesthetized with local anesthetic, and the skin edges were re-approximated with a running 4-0 Monocryl suture.  Wound was washed and dried and benzoin and steristrips were applied.  A gauze dressing was applied.  Instrument, sponge, and needle counts were correct prior to closure and at the conclusion of the case.  The patient tolerated the procedure well.  The patient was awakened from anesthesia and brought to the recovery room in stable condition.  [name], MD, [facility] Office: [phone]

## 2014-12-03 NOTE — Discharge Instructions (Signed)

## 2014-12-03 NOTE — H&P (Signed)
  General Surgery [facility]  [name] DOB: 1956/05/09 Single / Language: English / Race: White Male  History of Present Illness  The patient is a 59 year old male who presents with an inguinal hernia. Patient returns for follow-up of laparoscopic ventral hernia repair performed in August 2015. Patient also presents today with a new complaint of left inguinal hernia. Patient has noted a bulge in the left groin. This causes discomfort with different physical activities. He denies any signs or symptoms of obstruction. It is reducible when he lays recumbent. Patient has had a previous right inguinal hernia repair as a child. Otherwise, his laparoscopic ventral hernia repair continues to improve. He has no complaints about the surgical site. He is eating well. He is having normal bowel function.   Allergies No Known Drug Allergies09/06/2014  Medication History Advil (200MG  Capsule, Oral) Active. (PRN) Tetrahydrozoline HCl (0.05% Solution, Ophthalmic as needed) Active. Excedrin Extra Strength (250-250-65MG  Tablet, Oral as needed) Active.  Vitals 10/28/2014 10:37 AM Weight: 184 lb Height: 69in Body Surface Area: 2.02 m Body Mass Index: 27.17 kg/m Temp.: 97.72F(Temporal)  Pulse: 76 (Regular)  BP: 128/80 (Sitting, Left Arm, Standard)    Physical Exam  General - appears comfortable, no distress; not diaphorectic  HEENT - normocephalic; sclerae clear, gaze conjugate; mucous membranes moist, dentition good; voice normal  Neck - symmetric on extension; no palpable anterior or posterior cervical adenopathy; no palpable masses in the thyroid bed  Chest - clear bilaterally with rhonchi, rales, or wheeze  Cor - regular rhythm with normal rate; no significant murmur  Abd - soft without distension; well-healed surgical incisions; palpation over the middle abdomen shows no sign of recurrent ventral hernia  GU - right groin shows a  well-healed surgical incision with a broad scar; palpation in the right inguinal canal with cough and Valsalva shows a small inguinal hernia which is spontaneously reducible and mildly tender; palpation in the left inguinal canal shows a moderate size direct inguinal hernia which is moderately tender, soft, and reducible; hernia prolapses spontaneously and augments with coughing and Valsalva  Ext - non-tender without significant edema or lymphedema  Neuro - grossly intact; no tremor    Assessment & Plan REDUCIBLE LEFT INGUINAL HERNIA (550.90  K40.90)  Patient returns today for evaluation of his laparoscopic ventral incisional hernia repair with mesh. This appears to be holding up well and there is no sign of recurrent hernia.  Patient states there is a bulge in the left groin which is causing him discomfort. On examination, the patient does have a moderate sized left inguinal hernia which is reducible and a small spontaneously reducible recurrent right inguinal hernia. I provided the patient with literature to review at home. We have discussed inguinal hernia repair using mesh. We have discussed restrictions on his activities after the procedure. I have recommended proceeding with left inguinal hernia repair with mesh as an outpatient surgical procedure. Patient agrees to proceed.  The risks and benefits of the procedure have been discussed at length with the patient. The patient understands the proposed procedure, potential alternative treatments, and the course of recovery to be expected. All of the patient's questions have been answered at this time. The patient wishes to proceed with surgery.  [name], MD, [facility] Office: [phone]

## 2014-12-03 NOTE — Anesthesia Postprocedure Evaluation (Signed)
  Anesthesia Post-op Note  Patient: [name]  Procedure(s) Performed: Procedure(s) (LRB): LEFT INGUINAL HERNIA REPAIR WITH MESH (Left) INSERTION OF MESH (Left)  Patient Location: PACU  Anesthesia Type: General  Level of Consciousness: awake and alert   Airway and Oxygen Therapy: Patient Spontanous Breathing  Post-op Pain: mild  Post-op Assessment: Post-op Vital signs reviewed, Patient's Cardiovascular Status Stable, Respiratory Function Stable, Patent Airway and No signs of Nausea or vomiting  Last Vitals:  Filed Vitals:   12/03/14 1230  BP: 145/92  Pulse: 105  Temp: 36.7 C  Resp: 16    Post-op Vital Signs: stable   Complications: No apparent anesthesia complications

## 2014-12-03 NOTE — Progress Notes (Signed)
Pt has informed me that if his oxycodone prescription doesn't help his pain he has 60 morphine tablets at home that he will take.  I encouraged pt that he should not take other pain medication not prescribed by his doctor.  Pt states that he want to have "0" pain and will take what he can to have no surgical pain.  Teaching done about keeping his post op pain tolerable and that he will have surgical discomfort and soreness and it is to be expected.  Pt and lady friend at bedside have heard the instructions.

## 2014-12-03 NOTE — Progress Notes (Signed)
Patient states he took a Xanax this morning at 0415 but does not know the dosage

## 2014-12-04 ENCOUNTER — Encounter (HOSPITAL_COMMUNITY): Payer: Self-pay | Admitting: Surgery

## 2014-12-04 NOTE — Op Note (Signed)
DATE OF PROCEDURE:  11/07/2012  PREOPERATIVE DIAGNOSES: Status post diverticular abscess.   POSTOPERATIVE  DIAGNOSIS: Status post diverticular abscess.   OPERATION:  1.  Colostomy reversal.  2.  Appendectomy.  3.  Diverting ileostomy.   ANESTHESIA: General, with epidural.   SURGEON: Dr. Pat Patrick.   ASSISTANT:  Dr. Sherri Rad.   OPERATIVE PROCEDURE: With the patient in the supine position after the induction of appropriate general anesthesia and placement of an epidural catheter, the patient was placed in lithotomy position with a Foley catheter in place. The ostomy itself was oversewn with  3-0 nylon and closed. Betadine prep, alcohol wipe, and a Betadine-impregnated Steri-Strip  were utilized. Sterile drapes were placed. An elliptical incision was made around the ostomy and carried down through the subcutaneous tissue with electrocautery. The ostomy was freed from the fascia without difficulty and brought out through the abdominal wall. The distal end of the ostomy was stapled using a GIA-55 stapling device and brought back into the abdominal cavity. The abdomen was then swept. There appeared to be no significant abdominal wall adhesions. A midline incision was reopened and extended down to the suprapubic area. The incision was carried down through the subcutaneous tissue and fascia using Bovie electrocautery. The fascia was opened the length of the skin incision as was the peritoneum. A tedious dissection was required to expose the distal rectal stump. He had had the floor of the pelvis re-peritonealized, and there was significant inflammatory reaction in that area. It literally required digging the rectum out of the pelvic floor. Separating it from the bladder was paramount. Using a dilator in the rectum, it was easy to identify the rectum, and tedious dissection was required to expose it in a circumferential fashion. Once that was accomplished the left colon was mobilized through the splenic  flexure to the mid-transverse colon. The distal descending colon was opened and a pursestring clamp placed across the bowel. A pursestring suture of 2-0 nylon was placed. The 25-EEA stapling device was brought to the table and was separated and inserted into the distal bowel, and the anvil tied in place. The body of the stapler was then inserted through the rectum. The spike was driven through the rectal wall, married to be stapler and the stapler approximated and fired. The rings were intact. The rectum was insufflated with air with the pelvis under water and no evidence of any air leak or anastomotic air was identified. The area was copiously irrigated with warm saline solution. However, because of the significant dissection and the fact that the pelvis had a lot of  granulation tissue and inflammatory change and the anastomosis was deep in the pelvis, I elected to perform a diverting ileostomy. We also removed the patient's appendix. The mesoappendix was divided with a single-application KYH-06 stapling device carrying a regular load, and the base of the appendix was divided using a single application of a CB-76 stapling device carrying a blue load. The appendix was passed off the table. A site was chosen for the ileostomy right lower quadrant and a circular incision made in that skin. This incision was carried down through the subcutaneous tissue with Bovie electrocautery. A cruciate incision was made in the fascia and the peritoneum. A 2-fingerbreadths incision was accomplished without difficulty. The decimated piece of bowel was then looped through the abdominal wall, held with a Babcock clamp. The abdomen was then copiously irrigated. The omentum was laid down in the pelvis on a pedicle between the bladder and the rectum.  The area was copiously irrigated again. Midline fascia was closed with running sutures of #2 Prolene running from each end, tied in the middle. Interrupted sutures of #2 Prolene were used to  close the ostomy site. The skin was closed loosely, and the ostomy was matured with 3- and 4-0 Vicryl. An OsteoMed was applied and a sterile dressing applied. The patient was returned to the recovery room, having tolerated the procedure well. Sponge and needle counts were correct x2 in the operating room.      ____________________________ Rodena Goldmann III, MD rle:dm D: 11/07/2012 11:07:00 ET T: 11/07/2012 12:20:52 ET JOB#: [id number]  cc: Rodena Goldmann III, MD, <Dictator> Rodena Goldmann MD ELECTRONICALLY SIGNED 11/07/2012 18:56

## 2014-12-04 NOTE — Discharge Summary (Signed)
PATIENT NAME:  [name], [name] MR#:  [id number] DATE OF BIRTH:  1955/12/31  DATE OF ADMISSION:  12/25/2012 DATE OF DISCHARGE:  01/02/2013  FINAL DIAGNOSIS:  Complicated perforated diverticulitis with desire for ileostomy reversal.  PRINCIPAL PROCEDURES:  1.  Ileostomy reversal on 12/25/2012. 2.  Multiple abdominal x-rays.  3.  CT scan of the abdomen and pelvis with oral contrast.   HOSPITAL COURSE SUMMARY: The patient had the loop ileostomy reversed on the 14th of May.  He had adequate pain control. Postoperatively, his ileus resolved after postoperative day number 3.  He did have 2 episodes of vomiting on the 18th of May totaling greater than 800 mL along with 2 bowel movements and passing of gas which then developed into diarrhea. A plain film was obtained which demonstrated a moderate amount of free air. However, the patient remained hemodynamically stable with diminishing abdominal pain over the course of the remaining hospitalization. C. difficile assay was obtained and found to be negative. The patient was started on Imodium. CT scan with oral and intravenous contrast was performed on the evening of the 19th which demonstrated a small amount of free air as well as free fluid. There was some small bowel thickening. The pelvis appeared unremarkable with an intact colonic staple line. The patient was started on intravenous Zosyn empirically. The following day the patient continued to improve and remained hemodynamically stable with no evidence tachycardia or fever. Repeat plain film, 2-way of the abdomen, demonstrated less amount of free air. His wound remained clean. On postoperative day number 7, the patient was tolerating a regular diet and his diarrhea had nearly resolved. Repeat x-ray demonstrated persistent amount of a small amount of free air. White count remained normal. On postoperative day number 8 the patient continued to improve and arrangements were made for him to be discharged on  postoperative day number 8, on the 22nd of May. He will be discharged on 7 days of oral Cipro and Flagyl, as well as pain medication, with close followup in the office. He understands the implications of the free air and he is to call or return with any questions or concerns immediately.   DISCHARGE MEDICATIONS: 1.  Flagyl 500 mg by mouth every 8 hours for 7 days. 2.  Cipro 500 mg by mouth b.i.d. for 7 days. 3.  Percocet 5/325 mg 1 tab every 6 hours as needed for pain.  4.  Pepcid 1 tab by mouth daily.  DISCHARGE INSTRUCTIONS:  Scheduled followup with me in the [place] office on May the 28th. ____________________________ Jeannette How. Marina Gravel, MD mab:sb D: 01/02/2013 09:06:51 ET T: 01/02/2013 09:29:11 ET JOB#: [id number]  cc: Elta Guadeloupe A. Marina Gravel, MD, <Dictator> Seward Carol, MD Brennden Masten Bettina Gavia MD ELECTRONICALLY SIGNED 01/08/2013 12:13

## 2014-12-04 NOTE — Op Note (Signed)
PATIENT NAME:  [name], [name] MR#:  [id number] DATE OF BIRTH:  1956/04/15  DATE OF PROCEDURE:  12/25/2012  PREOPERATIVE DIAGNOSIS: Diverticulitis, with ileostomy.   POSTOPERATIVE DIAGNOSIS: Diverticulitis, with ileostomy.   SURGERY: Ileostomy reversal.  ANESTHESIA: General.   SURGEON: Rodena Goldmann, MD.  OPERATIVE PROCEDURE: With the patient in the supine position after receiving appropriate general anesthesia, the patient operative site was prepped with Betadine and draped with sterile towels. An alcohol wipe and Betadine-impregnated Steri-Drape were utilized. The ostomy had previously been sewn closed with 3-0 nylon. An elliptical  incision was made around the ostomy and carried down through to the subcutaneous tissues with Bovie electrocautery. The incision was carried down to the fascia where the small bowel was dissected free from the defect in the muscle and fascia. The bowel was elevated from through the incision without difficulty. An area was chosen on each side of the ostomy for the anastomosis. The bowel was divided using a single application of the Endo GIA stapler on each side. The mesentery was then taken down using clamps and 3-0 Vicryl. The specimen was passed off the table and gloves changed.   Two loops of bowel were placed side-by-side, 3-0 silk seromuscular sutures were placed as a back-wall suture. The bowel was then opened on each side, thinned to the lumen, and then a running suture of 3-0 Vicryl used to create a mucosa-to-mucosa anastomosis. The outside wall was then closed with a seromuscular suture of 3-0 silk. The anastomosis appeared to be satisfactory. The mesenteric defect was closed with 3-0 Vicryl. Bowel contents were returned to their anatomic position.   The area was copiously irrigated. Posterior peritoneum was closed with a running suture of  0 Vicryl. The anterior fascia was closed with doubled-loop PDS in a running fashion. The knot was buried. The skin was  closed with loose  3-0 nylon, and benzoin and Steri-Strips. A Steri-Strip and sterile dressing was applied. The patient was returned to the recovery room, having tolerated the procedure well. Sponge, and needle counts were correct x 2 in the operating room.   ____________________________ Rodena Goldmann III, MD rle:dm D: 12/25/2012 10:41:53 ET T: 12/25/2012 13:11:31 ET JOB#: [id number]  cc: Rodena Goldmann III, MD, <Dictator> Rodena Goldmann MD ELECTRONICALLY SIGNED 12/27/2012 22:02

## 2014-12-04 NOTE — Discharge Summary (Signed)
PATIENT NAME:  AMADO, ANDAL MR#:  [id number] DATE OF BIRTH:  04/07/1956  DATE OF ADMISSION:  11/07/2012 DATE OF DISCHARGE:  11/12/2012  BRIEF HISTORY:  The patient is a 59 year old gentleman who underwent a Hartmann's procedure in the fall for ruptured diverticulitis. The procedure was uncomplicated. He has recovered uneventfully. He presents now for ostomy reversal. After appropriate preoperative preparation and informed consent, he was taken to surgery on the morning of November 07, 2012. The ostomy was taken down and then on manual exploration of the abdomen, his pelvis was significantly scarred to the point that his rectum was very difficult to identify. Tedious dissection was required to expose enough rectum to perform the anastomosis. Because of the depth of the procedure and the surrounding inflammatory scarring, I elected to perform a diverting ileostomy. He had slow return of bowel function, but was able to tolerate a regular diet by March 31st. His wounds look good. He has no significant infection today. He will be set up for an ileostomy takedown in 4 to 6 weeks.   DISCHARGE MEDICATIONS:  Include alprazolam 0.5 mg p.o. q. 8 hours p.r.n., morphine immediate release 15 mg p.o. q. 3 hours p.r.n., morphine 60 mg 2 times a day.   FINAL DISCHARGE DIAGNOSIS:  Perforated diverticulitis.   SURGERY:  Colostomy reversal with diverting ileostomy.    ____________________________ Rodena Goldmann III, MD rle:si D: 11/12/2012 20:32:00 ET T: 11/12/2012 23:35:32 ET JOB#: [id number]  cc: Micheline Maze, MD, <Dictator> Ashby Dawes. Delfina Redwood, MD Rodena Goldmann MD ELECTRONICALLY SIGNED 11/13/2012 20:31

## 2015-04-23 DIAGNOSIS — F17200 Nicotine dependence, unspecified, uncomplicated: Secondary | ICD-10-CM | POA: Diagnosis not present

## 2015-04-23 DIAGNOSIS — I10 Essential (primary) hypertension: Secondary | ICD-10-CM | POA: Diagnosis not present

## 2015-04-23 DIAGNOSIS — N5201 Erectile dysfunction due to arterial insufficiency: Secondary | ICD-10-CM | POA: Diagnosis not present

## 2015-04-23 DIAGNOSIS — Z125 Encounter for screening for malignant neoplasm of prostate: Secondary | ICD-10-CM | POA: Diagnosis not present

## 2015-04-23 DIAGNOSIS — R252 Cramp and spasm: Secondary | ICD-10-CM | POA: Diagnosis not present

## 2015-04-23 DIAGNOSIS — Z0001 Encounter for general adult medical examination with abnormal findings: Secondary | ICD-10-CM | POA: Diagnosis not present

## 2015-04-23 DIAGNOSIS — Z1389 Encounter for screening for other disorder: Secondary | ICD-10-CM | POA: Diagnosis not present

## 2015-04-23 DIAGNOSIS — J449 Chronic obstructive pulmonary disease, unspecified: Secondary | ICD-10-CM | POA: Diagnosis not present

## 2015-04-23 DIAGNOSIS — Z79899 Other long term (current) drug therapy: Secondary | ICD-10-CM | POA: Diagnosis not present

## 2015-04-23 DIAGNOSIS — K439 Ventral hernia without obstruction or gangrene: Secondary | ICD-10-CM | POA: Diagnosis not present

## 2015-04-27 ENCOUNTER — Other Ambulatory Visit: Payer: Self-pay | Admitting: Acute Care

## 2015-04-27 ENCOUNTER — Encounter: Payer: Self-pay | Admitting: Acute Care

## 2015-04-27 DIAGNOSIS — F17210 Nicotine dependence, cigarettes, uncomplicated: Principal | ICD-10-CM

## 2015-04-30 ENCOUNTER — Telehealth: Payer: Self-pay | Admitting: Acute Care

## 2015-04-30 ENCOUNTER — Encounter: Payer: Self-pay | Admitting: Acute Care

## 2015-04-30 ENCOUNTER — Ambulatory Visit (INDEPENDENT_AMBULATORY_CARE_PROVIDER_SITE_OTHER): Payer: Medicare Other | Admitting: Acute Care

## 2015-04-30 ENCOUNTER — Ambulatory Visit (INDEPENDENT_AMBULATORY_CARE_PROVIDER_SITE_OTHER)
Admission: RE | Admit: 2015-04-30 | Discharge: 2015-04-30 | Disposition: A | Discharge status: Home / Self Care | Payer: Medicare Other | Source: Ambulatory Visit | Attending: Acute Care | Admitting: Acute Care

## 2015-04-30 DIAGNOSIS — F17210 Nicotine dependence, cigarettes, uncomplicated: Secondary | ICD-10-CM | POA: Diagnosis not present

## 2015-04-30 DIAGNOSIS — R7989 Other specified abnormal findings of blood chemistry: Secondary | ICD-10-CM | POA: Diagnosis not present

## 2015-04-30 NOTE — Telephone Encounter (Signed)
I called to let [name] know the results of his low dose CT scan. We discussed that his scan was read as a Lung RADS 1, which indicates no suspicious nodules identified. Recommendation is for a repeat annual screening scan in 12 months.  I told him I would call and schedule that in 12 months. I explained that I would let Dr. Inda Merlin know the results of the scan. He had no further questions and verbalized understanding of the results. He has my contact information in the event he has any additional questions or concerns.

## 2015-04-30 NOTE — Progress Notes (Signed)
Shared Decision Making Visit Lung Cancer Screening Program [id number])   Eligibility:  Age 59 y.o.  Pack Years Smoking History Calculation >45 years (# packs/per year x # years smoked)  Recent History of coughing up blood  no  Unexplained weight loss? no ( >Than 15 pounds within the last 6 months )  Prior History Lung / other cancer no (Diagnosis within the last 5 years already requiring surveillance chest CT Scans).  Smoking Status Current Smoker  Former Smokers: Years since quit:NA  Quit Date: NA  Visit Components:  Discussion included one or more decision making aids. yes  Discussion included risk/benefits of screening. yes  Discussion included potential follow up diagnostic testing for abnormal scans. yes  Discussion included meaning and risk of over diagnosis. yes  Discussion included meaning and risk of False Positives. yes  Discussion included meaning of total radiation exposure. yes  Counseling Included:  Importance of adherence to annual lung cancer LDCT screening. yes  Impact of comorbidities on ability to participate in the program. yes  Ability and willingness to under diagnostic treatment. yes  Smoking Cessation Counseling:  Current Smokers:   Discussed importance of smoking cessation. yes  Information about tobacco cessation classes and interventions provided to patient. yes  Patient provided with "ticket" for LDCT Scan. yes  Symptomatic Patient. no  Counseling:NA  Diagnosis Code: Tobacco Use Z72.0  Asymptomatic Patient yes  Counseling (Intermediate counseling: > three minutes counseling) X9371  Former Smokers:   Discussed the importance of maintaining cigarette abstinence. NA; Current smoker  Diagnosis Code: Personal History of Nicotine Dependence. I96.789  Information about tobacco cessation classes and interventions provided to patient. NA  Patient provided with "ticket" for LDCT Scan. As above  Written Order for Lung Cancer  Screening with LDCT placed in Epic. Yes (CT Chest Lung Cancer Screening Low Dose W/O CM) [id number] Z12.2-Screening of respiratory organs Z87.891-Personal history of nicotine dependence  I spent 15 minutes of face to face time with Mr. Dworkin explaining the risks and benefits of lung cancer screening. We viewed a power point together, stopping at intervals to allow for questions to be asked and answered, and to allow for discussion to ensure understanding of the information. We discussed that the single most powerful action he can take to decrease his risk of developing Lung Cancer is to quit smoking. He feels that when he is ready( he is not ready now) he will quit cold [place]. I discussed the fact that when he is ready I would like him to call me so that I can provide him with resources in the community to help. I gave him the "be stronger than your excuses" card with information about resources to assist in his liberation form cigarettes at the time he is ready. I also gave him my card and contact information and told him that [facility] has a series of smoking cessation classes that he may benefit from attending.I encouraged him to call me for help when he is ready so that we can help him successfully reach his goal of cigarette abstinence. I also gave him a copy of the power point we viewed together to refer to in the event he has any questions about the program. We confirmed time and location of the ct scan appointment. Mr. Liby had no further questions at the time he left the office. He verbalized understanding that I will call him with the results of his scan either this afternoon or Tuesday, and of all of the above information  we discussed.  Magdalen Spatz, NP

## 2015-05-03 DIAGNOSIS — Z1212 Encounter for screening for malignant neoplasm of rectum: Secondary | ICD-10-CM | POA: Diagnosis not present

## 2015-05-03 DIAGNOSIS — Z1211 Encounter for screening for malignant neoplasm of colon: Secondary | ICD-10-CM | POA: Diagnosis not present

## 2015-06-07 DIAGNOSIS — K439 Ventral hernia without obstruction or gangrene: Secondary | ICD-10-CM | POA: Diagnosis not present

## 2015-06-07 DIAGNOSIS — J449 Chronic obstructive pulmonary disease, unspecified: Secondary | ICD-10-CM | POA: Diagnosis not present

## 2015-06-07 DIAGNOSIS — Z79899 Other long term (current) drug therapy: Secondary | ICD-10-CM | POA: Diagnosis not present

## 2015-06-07 DIAGNOSIS — N5201 Erectile dysfunction due to arterial insufficiency: Secondary | ICD-10-CM | POA: Diagnosis not present

## 2015-06-07 DIAGNOSIS — R252 Cramp and spasm: Secondary | ICD-10-CM | POA: Diagnosis not present

## 2015-06-07 DIAGNOSIS — F17200 Nicotine dependence, unspecified, uncomplicated: Secondary | ICD-10-CM | POA: Diagnosis not present

## 2015-07-28 ENCOUNTER — Ambulatory Visit (INDEPENDENT_AMBULATORY_CARE_PROVIDER_SITE_OTHER): Payer: Medicare Other | Admitting: Podiatry

## 2015-07-28 ENCOUNTER — Ambulatory Visit (INDEPENDENT_AMBULATORY_CARE_PROVIDER_SITE_OTHER): Payer: Medicare Other

## 2015-07-28 ENCOUNTER — Encounter: Payer: Self-pay | Admitting: Podiatry

## 2015-07-28 VITALS — BP 164/87 | HR 74 | Resp 16

## 2015-07-28 DIAGNOSIS — M79672 Pain in left foot: Secondary | ICD-10-CM

## 2015-07-28 DIAGNOSIS — M216X9 Other acquired deformities of unspecified foot: Secondary | ICD-10-CM

## 2015-07-28 DIAGNOSIS — M779 Enthesopathy, unspecified: Secondary | ICD-10-CM | POA: Diagnosis not present

## 2015-07-28 MED ORDER — TRIAMCINOLONE ACETONIDE 10 MG/ML IJ SUSP
10.0000 mg | Freq: Once | INTRAMUSCULAR | Status: AC
  Administered 2015-07-28: 10 mg

## 2015-07-28 NOTE — Progress Notes (Signed)
   Subjective:    Patient ID: [name], male    DOB: 08/21/1955, 59 y.o.   MRN: [id number]  HPI  Pt presents with plantar forefoot pain, lasting 2 months  Review of Systems  All other systems reviewed and are negative.      Objective:   Physical Exam        Assessment & Plan:

## 2015-07-29 NOTE — Progress Notes (Signed)
Subjective:     Patient ID: [name], male   DOB: 08/17/1955, 59 y.o.   MRN: [id number]  HPI patient points to the outside bottom of the foot stating that it's been very tender and there is a lesion and he feels like there is fluid around the area   Review of Systems     Objective:   Physical Exam  Constitutional: He is oriented to person, place, and time.  Cardiovascular: Intact distal pulses.   Musculoskeletal: Normal range of motion.  Neurological: He is oriented to person, place, and time.  Skin: Skin is warm.  Nursing note and vitals reviewed.  neurovascular status found to be intact muscle strength adequate range of motion within normal limits with patient found to have exquisite inflammation around the fifth MPJ left foot with fluid buildup and small keratotic lesion which a very painful when palpated. Patient does have plantar flexion of the bone and is noted to have good digital perfusion and is well oriented 3     Assessment:      inflammatory capsulitis with plantarflexed metatarsal and lesion formation left    Plan:      H&P condition reviewed with patient and today I did a capsular plantar injection 3 mg [place] some Kenalog allowed complete numbness to occur and then debrided the lesion fully which was tolerated well

## 2015-11-03 ENCOUNTER — Other Ambulatory Visit: Payer: Self-pay | Admitting: Acute Care

## 2015-11-03 DIAGNOSIS — F17210 Nicotine dependence, cigarettes, uncomplicated: Principal | ICD-10-CM

## 2016-04-28 ENCOUNTER — Other Ambulatory Visit: Payer: Self-pay | Admitting: Internal Medicine

## 2016-04-28 DIAGNOSIS — Z79899 Other long term (current) drug therapy: Secondary | ICD-10-CM | POA: Diagnosis not present

## 2016-04-28 DIAGNOSIS — J449 Chronic obstructive pulmonary disease, unspecified: Secondary | ICD-10-CM | POA: Diagnosis not present

## 2016-04-28 DIAGNOSIS — I10 Essential (primary) hypertension: Secondary | ICD-10-CM | POA: Diagnosis not present

## 2016-04-28 DIAGNOSIS — N5201 Erectile dysfunction due to arterial insufficiency: Secondary | ICD-10-CM | POA: Diagnosis not present

## 2016-04-28 DIAGNOSIS — R1901 Right upper quadrant abdominal swelling, mass and lump: Secondary | ICD-10-CM | POA: Diagnosis not present

## 2016-04-28 DIAGNOSIS — K430 Incisional hernia with obstruction, without gangrene: Secondary | ICD-10-CM | POA: Diagnosis not present

## 2016-04-28 DIAGNOSIS — Z7189 Other specified counseling: Secondary | ICD-10-CM | POA: Diagnosis not present

## 2016-04-28 DIAGNOSIS — K439 Ventral hernia without obstruction or gangrene: Secondary | ICD-10-CM | POA: Diagnosis not present

## 2016-04-28 DIAGNOSIS — R252 Cramp and spasm: Secondary | ICD-10-CM | POA: Diagnosis not present

## 2016-04-28 DIAGNOSIS — Z125 Encounter for screening for malignant neoplasm of prostate: Secondary | ICD-10-CM | POA: Diagnosis not present

## 2016-04-28 DIAGNOSIS — R7989 Other specified abnormal findings of blood chemistry: Secondary | ICD-10-CM | POA: Diagnosis not present

## 2016-04-28 DIAGNOSIS — Z23 Encounter for immunization: Secondary | ICD-10-CM | POA: Diagnosis not present

## 2016-04-28 DIAGNOSIS — E559 Vitamin D deficiency, unspecified: Secondary | ICD-10-CM | POA: Diagnosis not present

## 2016-04-28 DIAGNOSIS — Z139 Encounter for screening, unspecified: Secondary | ICD-10-CM | POA: Diagnosis not present

## 2016-04-28 DIAGNOSIS — Z1389 Encounter for screening for other disorder: Secondary | ICD-10-CM | POA: Diagnosis not present

## 2016-04-28 DIAGNOSIS — Z0000 Encounter for general adult medical examination without abnormal findings: Secondary | ICD-10-CM | POA: Diagnosis not present

## 2016-04-28 DIAGNOSIS — F17200 Nicotine dependence, unspecified, uncomplicated: Secondary | ICD-10-CM | POA: Diagnosis not present

## 2016-05-03 ENCOUNTER — Ambulatory Visit (INDEPENDENT_AMBULATORY_CARE_PROVIDER_SITE_OTHER)
Admission: RE | Admit: 2016-05-03 | Discharge: 2016-05-03 | Disposition: A | Discharge status: Home / Self Care | Payer: Medicare Other | Source: Ambulatory Visit | Attending: Acute Care | Admitting: Acute Care

## 2016-05-03 DIAGNOSIS — Z87891 Personal history of nicotine dependence: Secondary | ICD-10-CM

## 2016-05-03 DIAGNOSIS — F17210 Nicotine dependence, cigarettes, uncomplicated: Principal | ICD-10-CM

## 2016-05-04 ENCOUNTER — Telehealth: Payer: Self-pay | Admitting: Acute Care

## 2016-05-04 DIAGNOSIS — F17210 Nicotine dependence, cigarettes, uncomplicated: Principal | ICD-10-CM

## 2016-05-04 NOTE — Telephone Encounter (Signed)
I have notified [name] of the results of his low-dose screening CT. I explained to him that his scan was read as a Lung RADS 1, negative study: no nodules or definitely benign nodules. Radiology recommendation is for a repeat LDCT in 12 months. I also told him that the scan revealed emphysema, and coronary artery atherosclerosis. Due to the fact this is a non-gated exam degree or severity of coronary artery atherosclerosis cannot be determined. Upon reviewing his medication list he is currently not on a statin, however gets his cholesterol checked annually by his primary care provider Dr. Josetta Huddle. I explained to [name] that I will forward the results of the scan to his primary care doctor Dr. Josetta Huddle, and that we will schedule him for his annual scan in 12 months. He verbalized understanding of the above and had no further questions upon completion of the call.

## 2016-05-10 ENCOUNTER — Other Ambulatory Visit: Payer: Medicare Other

## 2016-05-11 ENCOUNTER — Ambulatory Visit
Admission: RE | Admit: 2016-05-11 | Discharge: 2016-05-11 | Disposition: A | Discharge status: Home / Self Care | Payer: Medicare Other | Source: Ambulatory Visit | Attending: Internal Medicine | Admitting: Internal Medicine

## 2016-05-11 DIAGNOSIS — N281 Cyst of kidney, acquired: Secondary | ICD-10-CM | POA: Diagnosis not present

## 2016-05-11 DIAGNOSIS — R1901 Right upper quadrant abdominal swelling, mass and lump: Secondary | ICD-10-CM

## 2016-05-15 ENCOUNTER — Other Ambulatory Visit: Payer: Self-pay | Admitting: Internal Medicine

## 2016-05-15 DIAGNOSIS — R1901 Right upper quadrant abdominal swelling, mass and lump: Secondary | ICD-10-CM

## 2016-05-26 ENCOUNTER — Ambulatory Visit
Admission: RE | Admit: 2016-05-26 | Discharge: 2016-05-26 | Disposition: A | Discharge status: Home / Self Care | Payer: Medicare Other | Source: Ambulatory Visit | Attending: Internal Medicine | Admitting: Internal Medicine

## 2016-05-26 DIAGNOSIS — R1901 Right upper quadrant abdominal swelling, mass and lump: Secondary | ICD-10-CM

## 2016-05-26 DIAGNOSIS — N281 Cyst of kidney, acquired: Secondary | ICD-10-CM | POA: Diagnosis not present

## 2016-05-26 MED ORDER — GADOBENATE DIMEGLUMINE 529 MG/ML IV SOLN
15.0000 mL | Freq: Once | INTRAVENOUS | Status: AC | PRN
  Administered 2016-05-26: 15 mL via INTRAVENOUS

## 2016-06-08 DIAGNOSIS — R7989 Other specified abnormal findings of blood chemistry: Secondary | ICD-10-CM | POA: Diagnosis not present

## 2016-06-08 DIAGNOSIS — K709 Alcoholic liver disease, unspecified: Secondary | ICD-10-CM | POA: Diagnosis not present

## 2016-06-08 DIAGNOSIS — R740 Nonspecific elevation of levels of transaminase and lactic acid dehydrogenase [LDH]: Secondary | ICD-10-CM | POA: Diagnosis not present

## 2016-07-17 DIAGNOSIS — L02215 Cutaneous abscess of perineum: Secondary | ICD-10-CM | POA: Diagnosis not present

## 2016-10-27 DIAGNOSIS — N5201 Erectile dysfunction due to arterial insufficiency: Secondary | ICD-10-CM | POA: Diagnosis not present

## 2016-10-27 DIAGNOSIS — K439 Ventral hernia without obstruction or gangrene: Secondary | ICD-10-CM | POA: Diagnosis not present

## 2016-10-27 DIAGNOSIS — Z79899 Other long term (current) drug therapy: Secondary | ICD-10-CM | POA: Diagnosis not present

## 2016-10-27 DIAGNOSIS — Z139 Encounter for screening, unspecified: Secondary | ICD-10-CM | POA: Diagnosis not present

## 2016-10-27 DIAGNOSIS — I10 Essential (primary) hypertension: Secondary | ICD-10-CM | POA: Diagnosis not present

## 2016-10-27 DIAGNOSIS — I700 Atherosclerosis of aorta: Secondary | ICD-10-CM | POA: Diagnosis not present

## 2016-10-27 DIAGNOSIS — J449 Chronic obstructive pulmonary disease, unspecified: Secondary | ICD-10-CM | POA: Diagnosis not present

## 2016-10-27 DIAGNOSIS — R252 Cramp and spasm: Secondary | ICD-10-CM | POA: Diagnosis not present

## 2016-10-27 DIAGNOSIS — F17200 Nicotine dependence, unspecified, uncomplicated: Secondary | ICD-10-CM | POA: Diagnosis not present

## 2016-10-27 DIAGNOSIS — E559 Vitamin D deficiency, unspecified: Secondary | ICD-10-CM | POA: Diagnosis not present

## 2016-10-27 DIAGNOSIS — R160 Hepatomegaly, not elsewhere classified: Secondary | ICD-10-CM | POA: Diagnosis not present

## 2017-03-29 ENCOUNTER — Encounter: Payer: Self-pay | Admitting: Acute Care

## 2017-04-04 DIAGNOSIS — M791 Myalgia: Secondary | ICD-10-CM | POA: Diagnosis not present

## 2017-04-04 DIAGNOSIS — M549 Dorsalgia, unspecified: Secondary | ICD-10-CM | POA: Diagnosis not present

## 2017-04-04 DIAGNOSIS — L0292 Furuncle, unspecified: Secondary | ICD-10-CM | POA: Diagnosis not present

## 2017-04-28 ENCOUNTER — Emergency Department (HOSPITAL_COMMUNITY): Payer: Medicare Other

## 2017-04-28 ENCOUNTER — Encounter (HOSPITAL_COMMUNITY): Payer: Self-pay | Admitting: Emergency Medicine

## 2017-04-28 ENCOUNTER — Inpatient Hospital Stay (HOSPITAL_COMMUNITY)
Admission: EM | Admit: 2017-04-28 | Discharge: 2017-05-04 | DRG: 811 | Disposition: A | Discharge status: Home / Self Care | Payer: Medicare Other | Attending: Internal Medicine | Admitting: Internal Medicine

## 2017-04-28 DIAGNOSIS — K921 Melena: Secondary | ICD-10-CM | POA: Diagnosis present

## 2017-04-28 DIAGNOSIS — I21A1 Myocardial infarction type 2: Secondary | ICD-10-CM | POA: Diagnosis not present

## 2017-04-28 DIAGNOSIS — I248 Other forms of acute ischemic heart disease: Secondary | ICD-10-CM

## 2017-04-28 DIAGNOSIS — Z7982 Long term (current) use of aspirin: Secondary | ICD-10-CM

## 2017-04-28 DIAGNOSIS — I340 Nonrheumatic mitral (valve) insufficiency: Secondary | ICD-10-CM | POA: Diagnosis not present

## 2017-04-28 DIAGNOSIS — Z96643 Presence of artificial hip joint, bilateral: Secondary | ICD-10-CM | POA: Diagnosis present

## 2017-04-28 DIAGNOSIS — R42 Dizziness and giddiness: Secondary | ICD-10-CM | POA: Diagnosis present

## 2017-04-28 DIAGNOSIS — I8500 Esophageal varices without bleeding: Secondary | ICD-10-CM | POA: Diagnosis not present

## 2017-04-28 DIAGNOSIS — T39315A Adverse effect of propionic acid derivatives, initial encounter: Secondary | ICD-10-CM | POA: Diagnosis present

## 2017-04-28 DIAGNOSIS — K7030 Alcoholic cirrhosis of liver without ascites: Secondary | ICD-10-CM | POA: Diagnosis not present

## 2017-04-28 DIAGNOSIS — I8511 Secondary esophageal varices with bleeding: Secondary | ICD-10-CM | POA: Diagnosis not present

## 2017-04-28 DIAGNOSIS — E876 Hypokalemia: Secondary | ICD-10-CM | POA: Diagnosis not present

## 2017-04-28 DIAGNOSIS — E872 Acidosis, unspecified: Secondary | ICD-10-CM

## 2017-04-28 DIAGNOSIS — R0602 Shortness of breath: Secondary | ICD-10-CM | POA: Diagnosis not present

## 2017-04-28 DIAGNOSIS — R634 Abnormal weight loss: Secondary | ICD-10-CM | POA: Diagnosis present

## 2017-04-28 DIAGNOSIS — Z9049 Acquired absence of other specified parts of digestive tract: Secondary | ICD-10-CM

## 2017-04-28 DIAGNOSIS — L0292 Furuncle, unspecified: Secondary | ICD-10-CM | POA: Diagnosis present

## 2017-04-28 DIAGNOSIS — I259 Chronic ischemic heart disease, unspecified: Secondary | ICD-10-CM | POA: Diagnosis present

## 2017-04-28 DIAGNOSIS — J432 Centrilobular emphysema: Secondary | ICD-10-CM | POA: Diagnosis present

## 2017-04-28 DIAGNOSIS — K920 Hematemesis: Secondary | ICD-10-CM | POA: Diagnosis not present

## 2017-04-28 DIAGNOSIS — R51 Headache: Secondary | ICD-10-CM | POA: Diagnosis present

## 2017-04-28 DIAGNOSIS — Z791 Long term (current) use of non-steroidal anti-inflammatories (NSAID): Secondary | ICD-10-CM

## 2017-04-28 DIAGNOSIS — R0902 Hypoxemia: Secondary | ICD-10-CM | POA: Diagnosis not present

## 2017-04-28 DIAGNOSIS — A419 Sepsis, unspecified organism: Secondary | ICD-10-CM | POA: Diagnosis not present

## 2017-04-28 DIAGNOSIS — D62 Acute posthemorrhagic anemia: Secondary | ICD-10-CM | POA: Diagnosis not present

## 2017-04-28 DIAGNOSIS — K922 Gastrointestinal hemorrhage, unspecified: Secondary | ICD-10-CM | POA: Diagnosis present

## 2017-04-28 DIAGNOSIS — D649 Anemia, unspecified: Secondary | ICD-10-CM | POA: Diagnosis present

## 2017-04-28 DIAGNOSIS — I951 Orthostatic hypotension: Secondary | ICD-10-CM | POA: Diagnosis present

## 2017-04-28 DIAGNOSIS — N170 Acute kidney failure with tubular necrosis: Secondary | ICD-10-CM | POA: Diagnosis not present

## 2017-04-28 DIAGNOSIS — I998 Other disorder of circulatory system: Secondary | ICD-10-CM | POA: Diagnosis not present

## 2017-04-28 DIAGNOSIS — K7460 Unspecified cirrhosis of liver: Secondary | ICD-10-CM | POA: Diagnosis not present

## 2017-04-28 DIAGNOSIS — F1020 Alcohol dependence, uncomplicated: Secondary | ICD-10-CM | POA: Diagnosis present

## 2017-04-28 DIAGNOSIS — R531 Weakness: Secondary | ICD-10-CM

## 2017-04-28 DIAGNOSIS — N200 Calculus of kidney: Secondary | ICD-10-CM | POA: Diagnosis not present

## 2017-04-28 DIAGNOSIS — K642 Third degree hemorrhoids: Secondary | ICD-10-CM | POA: Diagnosis not present

## 2017-04-28 DIAGNOSIS — N179 Acute kidney failure, unspecified: Secondary | ICD-10-CM | POA: Diagnosis not present

## 2017-04-28 DIAGNOSIS — K449 Diaphragmatic hernia without obstruction or gangrene: Secondary | ICD-10-CM | POA: Diagnosis present

## 2017-04-28 DIAGNOSIS — N281 Cyst of kidney, acquired: Secondary | ICD-10-CM | POA: Diagnosis not present

## 2017-04-28 DIAGNOSIS — K219 Gastro-esophageal reflux disease without esophagitis: Secondary | ICD-10-CM | POA: Diagnosis present

## 2017-04-28 DIAGNOSIS — Z882 Allergy status to sulfonamides status: Secondary | ICD-10-CM

## 2017-04-28 DIAGNOSIS — F17210 Nicotine dependence, cigarettes, uncomplicated: Secondary | ICD-10-CM | POA: Diagnosis present

## 2017-04-28 DIAGNOSIS — Z79899 Other long term (current) drug therapy: Secondary | ICD-10-CM

## 2017-04-28 DIAGNOSIS — K644 Residual hemorrhoidal skin tags: Secondary | ICD-10-CM | POA: Diagnosis present

## 2017-04-28 DIAGNOSIS — D500 Iron deficiency anemia secondary to blood loss (chronic): Secondary | ICD-10-CM | POA: Diagnosis not present

## 2017-04-28 DIAGNOSIS — M1990 Unspecified osteoarthritis, unspecified site: Secondary | ICD-10-CM | POA: Diagnosis present

## 2017-04-28 DIAGNOSIS — K4090 Unilateral inguinal hernia, without obstruction or gangrene, not specified as recurrent: Secondary | ICD-10-CM | POA: Diagnosis not present

## 2017-04-28 DIAGNOSIS — J9601 Acute respiratory failure with hypoxia: Secondary | ICD-10-CM

## 2017-04-28 DIAGNOSIS — E871 Hypo-osmolality and hyponatremia: Secondary | ICD-10-CM | POA: Diagnosis not present

## 2017-04-28 DIAGNOSIS — E869 Volume depletion, unspecified: Secondary | ICD-10-CM | POA: Diagnosis present

## 2017-04-28 DIAGNOSIS — Z980 Intestinal bypass and anastomosis status: Secondary | ICD-10-CM

## 2017-04-28 DIAGNOSIS — G4700 Insomnia, unspecified: Secondary | ICD-10-CM | POA: Diagnosis not present

## 2017-04-28 DIAGNOSIS — Z87898 Personal history of other specified conditions: Secondary | ICD-10-CM

## 2017-04-28 LAB — RAPID HIV SCREEN (HIV 1/2 AB+AG)
HIV 1/2 Antibodies: NONREACTIVE
HIV-1 P24 Antigen - HIV24: NONREACTIVE

## 2017-04-28 LAB — CBG MONITORING, ED: Glucose-Capillary: 129 mg/dL — ABNORMAL HIGH (ref 65–99)

## 2017-04-28 LAB — COMPREHENSIVE METABOLIC PANEL
ALK PHOS: 122 U/L (ref 38–126)
ALT: 53 U/L (ref 17–63)
ANION GAP: 9 (ref 5–15)
AST: 100 U/L — ABNORMAL HIGH (ref 15–41)
Albumin: 2.9 g/dL — ABNORMAL LOW (ref 3.5–5.0)
BUN: 44 mg/dL — ABNORMAL HIGH (ref 6–20)
CALCIUM: 11.4 mg/dL — AB (ref 8.9–10.3)
CO2: 19 mmol/L — ABNORMAL LOW (ref 22–32)
Chloride: 103 mmol/L (ref 101–111)
Creatinine, Ser: 4.57 mg/dL — ABNORMAL HIGH (ref 0.61–1.24)
GFR calc non Af Amer: 12 mL/min — ABNORMAL LOW (ref >60)
GFR, EST AFRICAN AMERICAN: 14 mL/min — AB (ref >60)
Glucose, Bld: 126 mg/dL — ABNORMAL HIGH (ref 65–99)
POTASSIUM: 3.9 mmol/L (ref 3.5–5.1)
SODIUM: 131 mmol/L — AB (ref 135–145)
TOTAL PROTEIN: 6.8 g/dL (ref 6.5–8.1)
Total Bilirubin: 2.1 mg/dL — ABNORMAL HIGH (ref 0.3–1.2)

## 2017-04-28 LAB — CBC WITH DIFFERENTIAL/PLATELET
BASOS PCT: 1 %
Basophils Absolute: 0.1 10*3/uL (ref 0.0–0.1)
EOS ABS: 0.2 10*3/uL (ref 0.0–0.7)
Eosinophils Relative: 3 %
HCT: 9.2 % — ABNORMAL LOW (ref 39.0–52.0)
HEMOGLOBIN: 3.3 g/dL — AB (ref 13.0–17.0)
LYMPHS PCT: 21 %
Lymphs Abs: 1.7 10*3/uL (ref 0.7–4.0)
MCH: 32 pg (ref 26.0–34.0)
MCHC: 35.9 g/dL (ref 30.0–36.0)
MCV: 89.3 fL (ref 78.0–100.0)
MONO ABS: 1.4 10*3/uL — AB (ref 0.1–1.0)
Monocytes Relative: 17 %
NEUTROS ABS: 4.7 10*3/uL (ref 1.7–7.7)
Neutrophils Relative %: 58 %
Platelets: 39 10*3/uL — ABNORMAL LOW (ref 150–400)
RBC: 1.03 MIL/uL — ABNORMAL LOW (ref 4.22–5.81)
RDW: 17.5 % — ABNORMAL HIGH (ref 11.5–15.5)
WBC: 8.1 10*3/uL (ref 4.0–10.5)

## 2017-04-28 LAB — I-STAT TROPONIN, ED: TROPONIN I, POC: 2.5 ng/mL — AB (ref 0.00–0.08)

## 2017-04-28 LAB — ABO/RH: ABO/RH(D): AB POS

## 2017-04-28 LAB — PREPARE RBC (CROSSMATCH)

## 2017-04-28 LAB — LIPASE, BLOOD: Lipase: 122 U/L — ABNORMAL HIGH (ref 11–51)

## 2017-04-28 LAB — POC OCCULT BLOOD, ED: FECAL OCCULT BLD: POSITIVE — AB

## 2017-04-28 LAB — I-STAT CG4 LACTIC ACID, ED: LACTIC ACID, VENOUS: 3.71 mmol/L — AB (ref 0.5–1.9)

## 2017-04-28 MED ORDER — ONDANSETRON HCL 4 MG/2ML IJ SOLN
4.0000 mg | Freq: Once | INTRAMUSCULAR | Status: AC
  Administered 2017-04-28: 4 mg via INTRAVENOUS

## 2017-04-28 MED ORDER — SODIUM CHLORIDE 0.9 % IV SOLN
Freq: Once | INTRAVENOUS | Status: AC
  Administered 2017-04-29: 02:00:00 via INTRAVENOUS

## 2017-04-28 MED ORDER — PIPERACILLIN-TAZOBACTAM IN DEX 2-0.25 GM/50ML IV SOLN
2.2500 g | Freq: Four times a day (QID) | INTRAVENOUS | Status: DC
  Administered 2017-04-29: 2.25 g via INTRAVENOUS
  Filled 2017-04-28 (×4): qty 50

## 2017-04-28 MED ORDER — PIPERACILLIN-TAZOBACTAM 3.375 G IVPB 30 MIN
3.3750 g | Freq: Once | INTRAVENOUS | Status: AC
  Administered 2017-04-28: 3.375 g via INTRAVENOUS
  Filled 2017-04-28: qty 50

## 2017-04-28 MED ORDER — VANCOMYCIN HCL IN DEXTROSE 1-5 GM/200ML-% IV SOLN
1000.0000 mg | Freq: Once | INTRAVENOUS | Status: AC
  Administered 2017-04-28: 1000 mg via INTRAVENOUS
  Filled 2017-04-28: qty 200

## 2017-04-28 MED ORDER — SODIUM CHLORIDE 0.9 % IV SOLN
8.0000 mg/h | INTRAVENOUS | Status: DC
  Administered 2017-04-28 – 2017-04-29 (×4): 8 mg/h via INTRAVENOUS
  Filled 2017-04-28 (×8): qty 80

## 2017-04-28 MED ORDER — VANCOMYCIN HCL IN DEXTROSE 1-5 GM/200ML-% IV SOLN: 1000.0000 mg | INTRAVENOUS | Status: DC

## 2017-04-28 MED ORDER — ONDANSETRON HCL 4 MG/2ML IJ SOLN
4.0000 mg | Freq: Once | INTRAMUSCULAR | Status: AC
  Administered 2017-04-28: 4 mg via INTRAVENOUS
  Filled 2017-04-28: qty 2

## 2017-04-28 MED ORDER — SODIUM CHLORIDE 0.9 % IV BOLUS (SEPSIS)
30.0000 mL/kg | Freq: Once | INTRAVENOUS | Status: AC
  Administered 2017-04-28: 1836 mL via INTRAVENOUS

## 2017-04-28 MED ORDER — SODIUM CHLORIDE 0.9 % IV SOLN
80.0000 mg | Freq: Once | INTRAVENOUS | Status: AC
  Administered 2017-04-28: 20:00:00 80 mg via INTRAVENOUS
  Filled 2017-04-28: qty 80

## 2017-04-28 NOTE — ED Notes (Signed)
Second unit of blood started.  [id number]

## 2017-04-28 NOTE — ED Notes (Signed)
First unit complete

## 2017-04-28 NOTE — ED Triage Notes (Addendum)
Pt states 1 week of generalized weakness, unable to sleep. Loss of appetite. Pt appears very pale, and has had significant recent weight loss. Denies diarrhea, does report some occasional nausea vomiting after taking his medications on empty stomach. Pt states when he stands up from a seated or laying position he feels very confused and weak right after standing. Pt wife also reports memory loss for two weeks. Denies rectal bleeding. Denies hx of cancer.

## 2017-04-28 NOTE — Progress Notes (Signed)
  Pharmacy Antibiotic Note  [name] is a 61 y.o. male admitted on 04/28/2017 with sepsis.  Pharmacy has been consulted for vanc/zosyn dosing. WBC 8.1 and patient afebrile.  Scr elevated at 4.57 (Baseline < 1). CrCl ~ 14 ml/min. First doses to be given in the ED.   Plan: Zosyn 3.375 gm once then 2.25 gm every 6 hours Vancomycin 1000 mg X 1 then 1000 mg every 48 hours Monitor renal function, culture results Vanc trough at steady state   Weight: 135 lb (61.2 kg)  Temp (24hrs), Avg:98.1 F (36.7 C), Min:98.1 F (36.7 C), Max:98.1 F (36.7 C)   Recent Labs Lab 04/28/17 1910 04/28/17 1924  WBC 8.1  --   CREATININE 4.57*  --   LATICACIDVEN  --  3.71*    CrCl cannot be calculated (Unknown ideal weight.).    No Known Allergies  Antimicrobials this admission: Vanc 9/15 >> Zosyn 9/15 >>   Thank you for allowing pharmacy to be a part of this patient's care.  Susa Raring, PharmD PGY2 Infectious Diseases Pharmacy Resident Pager: [phone]  04/28/2017 7:58 PM
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
## 2017-04-28 NOTE — ED Notes (Signed)
1st RBC Started  [id number]

## 2017-04-29 ENCOUNTER — Encounter (HOSPITAL_COMMUNITY): Payer: Self-pay | Admitting: *Deleted

## 2017-04-29 DIAGNOSIS — I248 Other forms of acute ischemic heart disease: Secondary | ICD-10-CM | POA: Insufficient documentation

## 2017-04-29 DIAGNOSIS — D649 Anemia, unspecified: Secondary | ICD-10-CM | POA: Diagnosis present

## 2017-04-29 DIAGNOSIS — E872 Acidosis, unspecified: Secondary | ICD-10-CM | POA: Insufficient documentation

## 2017-04-29 DIAGNOSIS — K922 Gastrointestinal hemorrhage, unspecified: Secondary | ICD-10-CM

## 2017-04-29 DIAGNOSIS — K921 Melena: Secondary | ICD-10-CM | POA: Insufficient documentation

## 2017-04-29 DIAGNOSIS — N179 Acute kidney failure, unspecified: Secondary | ICD-10-CM | POA: Diagnosis present

## 2017-04-29 LAB — URINALYSIS, ROUTINE W REFLEX MICROSCOPIC
Bilirubin Urine: NEGATIVE
Glucose, UA: NEGATIVE mg/dL
Ketones, ur: NEGATIVE mg/dL
NITRITE: NEGATIVE
PH: 5 (ref 5.0–8.0)
Protein, ur: 30 mg/dL — AB
SPECIFIC GRAVITY, URINE: 1.016 (ref 1.005–1.030)

## 2017-04-29 LAB — CBC
HCT: 21.5 % — ABNORMAL LOW (ref 39.0–52.0)
HCT: 25.1 % — ABNORMAL LOW (ref 39.0–52.0)
HEMOGLOBIN: 7.6 g/dL — AB (ref 13.0–17.0)
Hemoglobin: 8.9 g/dL — ABNORMAL LOW (ref 13.0–17.0)
MCH: 30.6 pg (ref 26.0–34.0)
MCH: 31.1 pg (ref 26.0–34.0)
MCHC: 35.3 g/dL (ref 30.0–36.0)
MCHC: 35.5 g/dL (ref 30.0–36.0)
MCV: 86.7 fL (ref 78.0–100.0)
MCV: 87.8 fL (ref 78.0–100.0)
PLATELETS: 33 10*3/uL — AB (ref 150–400)
Platelets: 35 10*3/uL — ABNORMAL LOW (ref 150–400)
RBC: 2.48 MIL/uL — AB (ref 4.22–5.81)
RBC: 2.86 MIL/uL — ABNORMAL LOW (ref 4.22–5.81)
RDW: 14.9 % (ref 11.5–15.5)
RDW: 15.3 % (ref 11.5–15.5)
WBC: 6.8 10*3/uL (ref 4.0–10.5)
WBC: 8.2 10*3/uL (ref 4.0–10.5)

## 2017-04-29 LAB — CBC WITH DIFFERENTIAL/PLATELET
BASOS ABS: 0 10*3/uL (ref 0.0–0.1)
Basophils Relative: 0 %
EOS ABS: 0.3 10*3/uL (ref 0.0–0.7)
Eosinophils Relative: 4 %
HCT: 20.9 % — ABNORMAL LOW (ref 39.0–52.0)
Hemoglobin: 7.4 g/dL — ABNORMAL LOW (ref 13.0–17.0)
Lymphocytes Relative: 18 %
Lymphs Abs: 1.4 10*3/uL (ref 0.7–4.0)
MCH: 30.8 pg (ref 26.0–34.0)
MCHC: 35.4 g/dL (ref 30.0–36.0)
MCV: 87.1 fL (ref 78.0–100.0)
MONO ABS: 1.5 10*3/uL — AB (ref 0.1–1.0)
Monocytes Relative: 19 %
NEUTROS PCT: 59 %
Neutro Abs: 4.7 10*3/uL (ref 1.7–7.7)
Platelets: 32 10*3/uL — ABNORMAL LOW (ref 150–400)
RBC: 2.4 MIL/uL — AB (ref 4.22–5.81)
RDW: 14.9 % (ref 11.5–15.5)
WBC: 7.9 10*3/uL (ref 4.0–10.5)

## 2017-04-29 LAB — TSH: TSH: 0.604 u[IU]/mL (ref 0.350–4.500)

## 2017-04-29 LAB — MRSA PCR SCREENING: MRSA BY PCR: NEGATIVE

## 2017-04-29 LAB — LACTIC ACID, PLASMA: Lactic Acid, Venous: 1.5 mmol/L (ref 0.5–1.9)

## 2017-04-29 LAB — TROPONIN I: Troponin I: 4.44 ng/mL (ref <0.03)

## 2017-04-29 LAB — PROTIME-INR
INR: 1.56
PROTHROMBIN TIME: 18.6 s — AB (ref 11.4–15.2)

## 2017-04-29 LAB — BASIC METABOLIC PANEL
Anion gap: 5 (ref 5–15)
BUN: 43 mg/dL — ABNORMAL HIGH (ref 6–20)
CO2: 20 mmol/L — ABNORMAL LOW (ref 22–32)
CREATININE: 3.84 mg/dL — AB (ref 0.61–1.24)
Calcium: 10.2 mg/dL (ref 8.9–10.3)
Chloride: 108 mmol/L (ref 101–111)
GFR calc Af Amer: 18 mL/min — ABNORMAL LOW (ref >60)
GFR, EST NON AFRICAN AMERICAN: 15 mL/min — AB (ref >60)
Glucose, Bld: 119 mg/dL — ABNORMAL HIGH (ref 65–99)
Potassium: 4.5 mmol/L (ref 3.5–5.1)
SODIUM: 133 mmol/L — AB (ref 135–145)

## 2017-04-29 LAB — PREPARE RBC (CROSSMATCH)

## 2017-04-29 MED ORDER — INFLUENZA VAC SPLIT QUAD 0.5 ML IM SUSY: 0.5000 mL | PREFILLED_SYRINGE | INTRAMUSCULAR | Status: DC

## 2017-04-29 MED ORDER — ORAL CARE MOUTH RINSE
15.0000 mL | Freq: Two times a day (BID) | OROMUCOSAL | Status: DC
  Administered 2017-04-29 – 2017-05-04 (×6): 15 mL via OROMUCOSAL

## 2017-04-29 MED ORDER — DEXTROSE 5 % IV SOLN
2.0000 g | INTRAVENOUS | Status: DC
  Administered 2017-04-29 – 2017-05-02 (×4): 2 g via INTRAVENOUS
  Filled 2017-04-29 (×4): qty 2

## 2017-04-29 MED ORDER — SODIUM CHLORIDE 0.9 % IV SOLN
INTRAVENOUS | Status: DC
  Administered 2017-04-30 – 2017-05-01 (×2): via INTRAVENOUS

## 2017-04-29 MED ORDER — SODIUM CHLORIDE 0.9 % IV SOLN: Freq: Once | INTRAVENOUS | Status: DC

## 2017-04-29 NOTE — Progress Notes (Signed)
..   Name: [name] MRN: [id number] DOB: Feb 28, 1956    ADMISSION DATE:  04/28/2017 CONSULTATION DATE:  04/28/17  REFERRING MD :  ER - PA- Alferd Apa CHIEF COMPLAINT:  Dizziness and melena  BRIEF PATIENT DESCRIPTION: 61 yr old male, smoker, who presented 9/15 with reports of dizziness/lightheadedness, dark stools & 45lb weight loss in last 2 months. Hgb 3.3 on initial CBC  PMHx significant for b/l hip replacement, bowel abcess with perforation s/p repair s/p colostomy, s/p ileostomy post reversion of both.   , presents with 2 week history of dizziness and lightheadedness occurring 4-5 times a day whenever patient would rise from a seated or lying position. He states that there have been some times that he can not remember the incident but denies ever hitting his head or having a complete loss of consciousness.  He also reports within the last two days noticing that his stool was black in color. He denies any previous occurrence of this and has had a colonoscopy in 2016 that was negative.  No history of EGD.  He stated that he had some nausea and minimal vomitting, but denies fever, abdominal pain.  SUBJECTIVE: GI evaluation pending, on protonix gtt.  RN reports pt anxious. Received 5 units PRBC overnight. Pt denies acute complaints.  Feels like he needs to go to the bathroom.   VITAL SIGNS: Temp:  [97.2 F (36.2 C)-98.4 F (36.9 C)] 98.3 F (36.8 C) (09/16 0747) Pulse Rate:  [98-117] 106 (09/16 0800) Resp:  [11-35] 34 (09/16 0800) BP: (74-119)/(43-98) 99/65 (09/16 0700) SpO2:  [91 %-100 %] 97 % (09/16 0800) Weight:  [135 lb (61.2 kg)-141 lb 1.5 oz (64 kg)] 141 lb 1.5 oz (64 kg) (09/16 0100)  PHYSICAL EXAMINATION: General:  Thin adult male in no acute distress HEENT: MM pink/moist PSY: Calm/appropriate Neuro: AAO 4, M AE, nonfocal CV: s1s2 rrr, no m/r/g PULM: even/non-labored, lungs bilaterally clear VQ:QVZD, non-tender, bsx4 active  Extremities: warm/dry, no edema    Skin: no rashes or lesions   Recent Labs Lab 04/28/17 1910  NA 131*  K 3.9  CL 103  CO2 19*  BUN 44*  CREATININE 4.57*  GLUCOSE 126*    Recent Labs Lab 04/28/17 1910 04/29/17 0649  HGB 3.3* 7.4*  HCT 9.2* 20.9*  WBC 8.1 7.9  PLT 39* 32*   Ct Abdomen Pelvis Wo Contrast  Result Date: 04/28/2017 CLINICAL DATA:  Weight loss. Intermittent melena. Intermittent nausea and vomiting EXAM: CT ABDOMEN AND PELVIS WITHOUT CONTRAST TECHNIQUE: Multidetector CT imaging of the abdomen and pelvis was performed following the standard protocol without IV contrast. COMPARISON:  CT abdomen and pelvis November 14, 2013; abdominal MRI May 26, 2016 FINDINGS: Lower chest: There is centrilobular emphysematous change in lung bases. There is a bulla in the right middle lobe laterally. There is an area of probable rounded atelectasis in the medial right base posteriorly. Hepatobiliary: Liver measures 20.3 cm in length. The left lobe of the liver is markedly atrophic. The caudate lobe is prominent. No focal liver lesions are appreciable on this noncontrast enhanced study. Gallbladder wall is not appreciably thickened. There is no biliary duct dilatation. Pancreas: There is no pancreatic mass or inflammatory focus. Spleen: Spleen measures 15.8 x 13.3 x 6.6 cm with a measured splenic volume of 693 cubic cm. No focal splenic lesions are evident on this noncontrast enhanced study. Adrenals/Urinary Tract: Adrenals bilaterally appear unremarkable. There are multiple cysts arising from the right kidney, a finding present on previous studies.  Largest cyst on the right measures 3.8 x 3.8 cm. There are cystic areas in the left kidney. There is a probable cyst which contains debris arising from the posterior mid left kidney measuring 2.2 x 1.4 cm. This lesion is stable in appearance compared to prior study from 2015. There is no hydronephrosis on either side. There are scattered 1 mm calculi in right kidney. There is a 4 x 4 mm  calculus with an adjacent 3 x 2 mm calculus in the upper to mid right kidney. There are scattered 1-2 mm calculi throughout the left kidney. There is no ureteral calculus on either side. Urinary bladder is midline. No urinary bladder wall lesion is evident. Note that evaluation urinary bladder is limited due to artifact from total hip replacements bilaterally. Stomach/Bowel: Rectum is borderline distended with air and stool. Rectal wall does not appear thickened. Artifact from total hip replacements makes evaluation of the rectal region less than optimal. There is no appreciable bowel wall or mesenteric thickening. No evident bowel obstruction. No free air or portal venous air. Vascular/Lymphatic: There are varices in the upper abdomen. There is atherosclerotic calcification aorta and common iliac arteries. There is evidence of moderately severe obstruction in the proximal iliac arteries bilaterally due to calcified plaque. Major mesenteric vessels appear patent on this noncontrast enhanced study, although there is moderate calcification in the major mesenteric vessels proximally. There are scattered subcentimeter retroperitoneal lymph nodes. There are small lymph nodes in the periportal region, likely due to underlying cirrhosis. There is no frank adenopathy by size criteria in the abdomen or pelvis. Reproductive: Prostate and seminal vesicles appear grossly normal in size and contour. Artifact from total hip replacements makes evaluation of this area less than optimal. Other: There are surgical clips in the periappendiceal region. There is no periappendiceal region inflammation. There are areas of postoperative change in the pelvis. No hernia is currently seen. No abscess or ascites is evident in the abdomen or pelvis. Musculoskeletal: Total hip replacements noted bilaterally. There are no blastic or lytic bone lesions. There is degenerative change in the lumbar spine. There is no intramuscular or abdominal wall  lesion. IMPRESSION: 1. Evidence of hepatic cirrhosis with splenomegaly and upper abdominal varices. Liver is enlarged but unchanged from prior studies. No focal liver lesions are evident on this noncontrast enhanced study. 2. No evident bowel obstruction. No abscess. No periappendiceal region inflammation. 3. Subcentimeter abdominal lymph nodes, most likely secondary to underlying cirrhosis. 4. Probable rounded atelectasis posterior right lung base. Underlying centrilobular emphysematous change. 5. Aortoiliac atherosclerosis. There appears to be hemodynamically significant obstructive disease in both proximal common iliac arteries due to extensive calcified plaque. 6. Multiple small nonobstructing renal calculi bilaterally. No hydronephrosis or ureteral calculus on either side. 7. Multiple renal cysts, larger on the right than on the left. There is a stable cyst which contains debris in the posterior mid left kidney. Stability since 2015 is felt to be indicative of benign etiology. 8.  Status post total hip replacements bilaterally. 9. Given history of melena, colonoscopy is advised after bowel preparation if direct colonic visualization is not been performed recently. Note that there are areas suggesting previous bowel surgery with patent anastomoses. Aortic Atherosclerosis (ICD10-I70.0) and Emphysema (ICD10-J43.9). Electronically Signed   By: Lowella Grip III M.D.   On: 04/28/2017 21:41   Dg Chest Port 1 View  Result Date: 04/28/2017 CLINICAL DATA:  Sepsis and fever. EXAM: PORTABLE CHEST 1 VIEW COMPARISON:  05/03/2016 chest CT 03/10/2014 chest radiograph FINDINGS: Upper  limits normal heart size noted. Mild interstitial opacities in the lower lungs noted. No evidence of pneumothorax, pleural effusion, airspace disease or acute bony abnormality. IMPRESSION: Mild lower lung interstitial opacities which may represent mild interstitial edema. No evidence of focal pneumonia. Electronically Signed   By: Margarette Canada M.D.   On: 04/28/2017 19:38   SIGNIFICANT EVENTS  9/15   Admit with anemia, presyncope, concern for GI bleed.  PRBC 5 units   STUDIES:  CT Abdomen/Pelvis 9/15 >> evidence of hepatic cirrhosis with splenomegaly and upper abdominal varices, liver enlarged but unchanged from prior studies, no evidence of bowel obstruction or abscess, rounded atelectasis in right lung base, centrilobular emphysema, aortoiliac atherosclerosis, multiple small nonobstructing renal calculi bilaterally  ANTIBIOTICS Vanco 9/15 x1 Zosyn 9/15 x1  Ceftriaxone 9/16 (empiric SBP) >>   CULTURES BCx2 9/15 >>  UC 9/15 >>   ASSESSMENT / PLAN:  NEURO: A:  Pre-syncope - secondary to anemia  ETOH Abuse  P: Monitor neuro exam in ICU Bed rest with BRP Alcohol abuse counseling   CARDIAC: A: NSTEMI - demand ischemia  P: Follow troponin  Ensure adequate BP / perfusion  Monitor in ICU   PULMONARY: A: Tobacco Abuse  Centrilobular Emphysema - noted on CT ABD P: O2 as needed to support sats 88-95% Pulmonary hygiene  Will need outpatient pulmonary evaluation for COPD   ID: A: Code Sepsis - triggered in ER, doubt infectious process.  Suspect lactic acid was related to anemia  P: Follow cultures  Empiric Rocephin, pending results of EGD, likely can d/c  Trend lactic acid   Endocrine: A: No acute process  P: Assess TSH given weight loss  GI: A: GIB  Hepatic Cirrhosis with Varices - MELD 30 on presentation P: NPO  Confirmed GI evaluation is pending  Anticipate he will need EGD  Concern for underlying malignancy given smoking hx, weight loss and GIB Continue protonix gtt   HEME A: Severe Symptomatic Anemia - in setting of UGIB, admit Hgb 3.3 P: Trend CBC  Monitor for bleeding  SCD's for DVT prophylaxis   RENAL A: AKI - in setting of GIB, hypoperfusion, NSAID use Renal Cysts - noted on CT, no evidence of hydronephrosis   P: Fluid resuscitation with PRBC's Trend BMP / urinary  output Replace electrolytes as indicated Avoid nephrotoxic agents, ensure adequate renal perfusion   Critical Care Time:  30 minutes   Noe Gens, NP-C [facility] Pgr: [phone] or if no answer [phone] 04/29/2017, 8:05 AM

## 2017-04-29 NOTE — ED Notes (Signed)
Second unit of blood complete. 

## 2017-04-29 NOTE — Progress Notes (Signed)
  Pharmacy Antibiotic Note  [name] is a 61 y.o. male admitted on 04/28/2017 with sepsis.  Pharmacy has been consulted for vanc/zosyn dosing.   Today, pharmacy asked to stop vanc and zosyn, and switch to ceftriaxone monotherapy for SBP prophylaxis, given liver cirrhosis and high MELD score.  Plan: Ceftriaxone 2g IV q 24 hrs - dose already entered by CCM. F/u cultures, renal function and clinical course.  Height: 5\' 9"  (175.3 cm) Weight: 141 lb 1.5 oz (64 kg) IBW/kg (Calculated) : 70.7  Temp (24hrs), Avg:98 F (36.7 C), Min:97.2 F (36.2 C), Max:98.4 F (36.9 C)   Recent Labs Lab 04/28/17 1910 04/28/17 1924 04/29/17 0146 04/29/17 0649  WBC 8.1  --   --  7.9  CREATININE 4.57*  --   --   --   LATICACIDVEN  --  3.71* 1.5  --     Estimated Creatinine Clearance: 15 mL/min (A) (by C-G formula based on SCr of 4.57 mg/dL (H)).    Allergies  Allergen Reactions  . Sulfa Antibiotics Other (See Comments)    CAUSED BLISTERS IN THE MOUTH   Antimicrobials this admission:  9/15 Vanc>9/16 9/15 zosyn >>9/16 9/16 CTX >   Dose adjustments this admission:    Microbiology results:  9/15 BCx x 2:  9/16 MRSA PCR: neg   Thank you for allowing pharmacy to be a part of this patient's care.  Uvaldo Rising, BCPS  Clinical Pharmacist Pager [phone]  04/29/2017 9:01 AM
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
## 2017-04-29 NOTE — Plan of Care (Signed)
CRITICAL VALUE ALERT  Critical Value:  Troponin 4.44  Date & Time Notied:  04/29/17 1358  Provider Notified: Cherlynn Kaiser  Orders Received/Actions taken: none

## 2017-04-30 ENCOUNTER — Inpatient Hospital Stay (HOSPITAL_COMMUNITY): Payer: Medicare Other

## 2017-04-30 ENCOUNTER — Encounter (HOSPITAL_COMMUNITY): Payer: Self-pay | Admitting: Certified Registered Nurse Anesthetist

## 2017-04-30 DIAGNOSIS — I340 Nonrheumatic mitral (valve) insufficiency: Secondary | ICD-10-CM

## 2017-04-30 LAB — TYPE AND SCREEN
ABO/RH(D): AB POS
ANTIBODY SCREEN: NEGATIVE
UNIT DIVISION: 0
UNIT DIVISION: 0
UNIT DIVISION: 0
UNIT DIVISION: 0
UNIT DIVISION: 0
Unit division: 0
Unit division: 0
Unit division: 0
Unit division: 0

## 2017-04-30 LAB — CBC
HCT: 23.6 % — ABNORMAL LOW (ref 39.0–52.0)
HCT: 24.7 % — ABNORMAL LOW (ref 39.0–52.0)
Hemoglobin: 8.4 g/dL — ABNORMAL LOW (ref 13.0–17.0)
Hemoglobin: 8.4 g/dL — ABNORMAL LOW (ref 13.0–17.0)
MCH: 31.3 pg (ref 26.0–34.0)
MCH: 30.7 pg (ref 26.0–34.0)
MCHC: 35.6 g/dL (ref 30.0–36.0)
MCHC: 34 g/dL (ref 30.0–36.0)
MCV: 88.1 fL (ref 78.0–100.0)
MCV: 90.1 fL (ref 78.0–100.0)
Platelets: 37 10*3/uL — ABNORMAL LOW (ref 150–400)
Platelets: 70 10*3/uL — ABNORMAL LOW (ref 150–400)
RBC: 2.68 MIL/uL — ABNORMAL LOW (ref 4.22–5.81)
RBC: 2.74 MIL/uL — ABNORMAL LOW (ref 4.22–5.81)
RDW: 15.7 % — ABNORMAL HIGH (ref 11.5–15.5)
RDW: 16.1 % — ABNORMAL HIGH (ref 11.5–15.5)
WBC: 6.4 10*3/uL (ref 4.0–10.5)
WBC: 7.1 10*3/uL (ref 4.0–10.5)

## 2017-04-30 LAB — BPAM RBC
BLOOD PRODUCT EXPIRATION DATE: 201810042359
BLOOD PRODUCT EXPIRATION DATE: 201810042359
BLOOD PRODUCT EXPIRATION DATE: 201810062359
BLOOD PRODUCT EXPIRATION DATE: 201810082359
Blood Product Expiration Date: 201810042359
Blood Product Expiration Date: 201810062359
Blood Product Expiration Date: 201810082359
Blood Product Expiration Date: 201810082359
Blood Product Expiration Date: 201810082359
ISSUE DATE / TIME: 201809151955
ISSUE DATE / TIME: 201809152215
ISSUE DATE / TIME: 201809160111
ISSUE DATE / TIME: 201809161542
ISSUE DATE / TIME: 201809151955
ISSUE DATE / TIME: 201809160249
UNIT TYPE AND RH: 6200
UNIT TYPE AND RH: 8400
UNIT TYPE AND RH: 8400
UNIT TYPE AND RH: 9500
Unit Type and Rh: 6200
Unit Type and Rh: 6200
Unit Type and Rh: 8400
Unit Type and Rh: 8400
Unit Type and Rh: 9500

## 2017-04-30 LAB — ECHOCARDIOGRAM COMPLETE
HEIGHTINCHES: 69 in
WEIGHTICAEL: 2257.51 [oz_av]

## 2017-04-30 LAB — BASIC METABOLIC PANEL
ANION GAP: 3 — AB (ref 5–15)
BUN: 39 mg/dL — ABNORMAL HIGH (ref 6–20)
CALCIUM: 9.6 mg/dL (ref 8.9–10.3)
CO2: 20 mmol/L — AB (ref 22–32)
Chloride: 109 mmol/L (ref 101–111)
Creatinine, Ser: 3.29 mg/dL — ABNORMAL HIGH (ref 0.61–1.24)
GFR calc Af Amer: 21 mL/min — ABNORMAL LOW (ref >60)
GFR calc non Af Amer: 19 mL/min — ABNORMAL LOW (ref >60)
Glucose, Bld: 110 mg/dL — ABNORMAL HIGH (ref 65–99)
Potassium: 4.3 mmol/L (ref 3.5–5.1)
Sodium: 132 mmol/L — ABNORMAL LOW (ref 135–145)

## 2017-04-30 LAB — URINE CULTURE: CULTURE: NO GROWTH

## 2017-04-30 LAB — TROPONIN I
TROPONIN I: 4.11 ng/mL — AB (ref <0.03)
Troponin I: 2.57 ng/mL (ref <0.03)
Troponin I: 2.92 ng/mL

## 2017-04-30 MED ORDER — TRAZODONE HCL 50 MG PO TABS
50.0000 mg | ORAL_TABLET | Freq: Every day | ORAL | Status: DC
  Administered 2017-04-30: 50 mg via ORAL
  Filled 2017-04-30: qty 1

## 2017-04-30 MED ORDER — BOOST / RESOURCE BREEZE PO LIQD
1.0000 | Freq: Three times a day (TID) | ORAL | Status: DC
  Administered 2017-04-30 – 2017-05-04 (×8): 1 via ORAL

## 2017-04-30 MED ORDER — ZOLPIDEM TARTRATE 5 MG PO TABS
5.0000 mg | ORAL_TABLET | Freq: Once | ORAL | Status: AC
  Administered 2017-04-30: 5 mg via ORAL
  Filled 2017-04-30: qty 1

## 2017-04-30 MED ORDER — SODIUM CHLORIDE 0.9 % IV SOLN: Freq: Once | INTRAVENOUS | Status: AC

## 2017-04-30 MED ORDER — PANTOPRAZOLE SODIUM 40 MG IV SOLR
40.0000 mg | Freq: Two times a day (BID) | INTRAVENOUS | Status: DC
  Administered 2017-04-30 (×2): 40 mg via INTRAVENOUS
  Filled 2017-04-30 (×2): qty 40

## 2017-04-30 MED ORDER — SODIUM CHLORIDE 0.9 % IV SOLN
50.0000 ug/h | INTRAVENOUS | Status: DC
  Administered 2017-04-30 – 2017-05-01 (×3): 50 ug/h via INTRAVENOUS
  Filled 2017-04-30 (×6): qty 1

## 2017-04-30 MED ORDER — IPRATROPIUM-ALBUTEROL 0.5-2.5 (3) MG/3ML IN SOLN: 3.0000 mL | Freq: Four times a day (QID) | RESPIRATORY_TRACT | Status: DC

## 2017-04-30 MED ORDER — ALBUMIN HUMAN 25 % IV SOLN
100.0000 g | Freq: Every day | INTRAVENOUS | Status: DC
  Filled 2017-04-30: qty 400

## 2017-04-30 MED ORDER — IPRATROPIUM-ALBUTEROL 0.5-2.5 (3) MG/3ML IN SOLN
3.0000 mL | Freq: Three times a day (TID) | RESPIRATORY_TRACT | Status: DC
  Administered 2017-05-01 – 2017-05-04 (×9): 3 mL via RESPIRATORY_TRACT
  Filled 2017-04-30 (×12): qty 3

## 2017-04-30 MED ORDER — IPRATROPIUM-ALBUTEROL 0.5-2.5 (3) MG/3ML IN SOLN
3.0000 mL | Freq: Four times a day (QID) | RESPIRATORY_TRACT | Status: DC
  Administered 2017-04-30: 3 mL via RESPIRATORY_TRACT
  Filled 2017-04-30 (×2): qty 3

## 2017-04-30 NOTE — Progress Notes (Signed)
eLink Physician-Brief Progress Note Patient Name: [name] DOB: [birth year] MRN: [id number]   Date of Service  04/30/2017  HPI/Events of Note  Patient complaining of insomnia which was a problem at home as well. Patient reports feeling anxious about procedure tomorrow. Has not tried any medications at home. Underlying liver cirrhosis.   eICU Interventions  Ambien 5mg  po x1 ordered      Intervention Category Major Interventions: Other:  Tera Partridge 04/30/2017, 11:47 PM

## 2017-04-30 NOTE — Progress Notes (Signed)
[facility]astroenterology Progress Note  BRYNDON CUMBIE 61 y.o. 05/18/1956  CC:  Symptomatic anemia   Subjective: Patient is tachycardic at this time. Complaining come Baseline shortness of breath. He denied any further melena . He denied abdominal pain, nausea or vomiting.  ROS : Negative for active chest pain but positive for shortness of breath. Positive for weakness.   Objective: Vital signs in last 24 hours: Vitals:   04/30/17 0700 04/30/17 0731  BP: (!) 99/54   Pulse: (!) 111   Resp: (!) 30   Temp:  98.5 F (36.9 C)  SpO2: 98%     Physical Exam:  General:  Alert, cooperative, no distress,   Head:  Normocephalic, without obvious abnormality, atraumatic  Eyes:  , EOM's intact, No scleral icterus   Lungs:   Anterior exam only. Decreased breath sounds bilaterally, respirations unlabored  Heart:  Tachycardic, S1, S2 normal  Abdomen:   Soft, non-tender, bowel sounds active all four quadrants,  no masses,   Extremities: Extremities normal, atraumatic,        Lab Results:  Recent Labs  04/29/17 1038 04/30/17 0216  NA 133* 132*  K 4.5 4.3  CL 108 109  CO2 20* 20*  GLUCOSE 119* 110*  BUN 43* 39*  CREATININE 3.84* 3.29*  CALCIUM 10.2 9.6    Recent Labs  04/28/17 1910  AST 100*  ALT 53  ALKPHOS 122  BILITOT 2.1*  PROT 6.8  ALBUMIN 2.9*    Recent Labs  04/28/17 1910 04/29/17 0649 04/29/17 1038 04/29/17 1804  WBC 8.1 7.9 6.8 8.2  NEUTROABS 4.7 4.7  --   --   HGB 3.3* 7.4* 7.6* 8.9*  HCT 9.2* 20.9* 21.5* 25.1*  MCV 89.3 87.1 86.7 87.8  PLT 39* 32* 35* 33*    Recent Labs  04/29/17 0146  LABPROT 18.6*  INR 1.56      Assessment/Plan: - Symptomatic anemia with possible upper GI bleed. Hemoglobin stable after blood transfusion. - Cirrhosis with meld score of 28 as of 04/29/2017  - Elevated troponins. Workup pending - Acute kidney injury - Thrombocytopenia  Recommendations -------------------------- - Patient currently borderline hypotensive  and has tachycardia. He has elevated troponins. No evidence of active destabilizing bleeding. Hemoglobin stable. - Anesthesia prefers further cardiac evaluation such as Echo prior to any endoscopic intervention. - Hold off on EGD today. Plan for EGD tomorrow. - IV albumin 100 g per day for 2 days. - Continue antibiotic and PPI for now. Continue octreotide. - May consider platelet transfusion prior to EGD tomorrow. - Patient with advance MELD score along with acute kidney injury and elevated troponins. Prognosis remains poor. - His last alcohol use was 6 weeks ago, which may prevent liver transplant evaluation. - GI will follow   [name] MD, [place] 04/30/2017, 8:24 AM  Pager [phone]  If no answer or after 5 PM call [phone]

## 2017-04-30 NOTE — Progress Notes (Signed)
  Echocardiogram 2D Echocardiogram has been performed.  [name] 04/30/2017, 11:36 AM

## 2017-04-30 NOTE — Progress Notes (Signed)
Name: [name] MRN: [id number] DOB: [birth year]    ADMISSION DATE:  04/28/2017 CONSULTATION DATE:  04/28/17  REFERRING MD :  ER - PA- Alferd Apa CHIEF COMPLAINT:  Dizziness and melena  BRIEF PATIENT DESCRIPTION: 61 yr old male, smoker, who presented 9/15 with reports of dizziness/lightheadedness, dark stools & 45lb weight loss in last 2 months. Hgb 3.3 on initial CBC  PMHx significant for b/l hip replacement, bowel abcess with perforation s/p repair s/p colostomy, s/p ileostomy post reversion of both.   SUBJECTIVE:  Pt reports one bloody stool this am.  States he feels "ok".  On octreotide gtt, PPI BID.  Denies dizziness / pre-syncope feeling when standing. Today he c/o cold feet and also c/o some anxiety. He says he wants to get up and walk around.   VITAL SIGNS: Temp:  [97.9 F (36.6 C)-99 F (37.2 C)] 98.5 F (36.9 C) (09/17 0731) Pulse Rate:  [98-111] 100 (09/17 1000) Resp:  [17-36] 17 (09/17 1000) BP: (89-102)/(46-59) 90/59 (09/17 1000) SpO2:  [96 %-99 %] 97 % (09/17 1000)  PHYSICAL EXAMINATION: General: thin adult male in NAD HEENT: MM pink/moist, no jvd PSY: calm/appropriate  Neuro: AAOx4, speech clear, MAE  CV: s1s2 rrr, no m/r/g PULM: even/non-labored, lungs bilaterally with faint wheezing HY:WVPX, non-tender, bsx4 active  Extremities: warm/dry, no edema  Skin: no rashes or lesions    Recent Labs Lab 04/28/17 1910 04/29/17 1038 04/30/17 0216  NA 131* 133* 132*  K 3.9 4.5 4.3  CL 103 108 109  CO2 19* 20* 20*  BUN 44* 43* 39*  CREATININE 4.57* 3.84* 3.29*  GLUCOSE 126* 119* 110*    Recent Labs Lab 04/29/17 1038 04/29/17 1804 04/30/17 0809  HGB 7.6* 8.9* 8.4*  HCT 21.5* 25.1* 23.6*  WBC 6.8 8.2 6.4  PLT 35* 33* 37*   Ct Abdomen Pelvis Wo Contrast  Result Date: 04/28/2017 CLINICAL DATA:  Weight loss. Intermittent melena. Intermittent nausea and vomiting EXAM: CT ABDOMEN AND PELVIS WITHOUT CONTRAST TECHNIQUE: Multidetector CT imaging of  the abdomen and pelvis was performed following the standard protocol without IV contrast. COMPARISON:  CT abdomen and pelvis November 14, 2013; abdominal MRI May 26, 2016 FINDINGS: Lower chest: There is centrilobular emphysematous change in lung bases. There is a bulla in the right middle lobe laterally. There is an area of probable rounded atelectasis in the medial right base posteriorly. Hepatobiliary: Liver measures 20.3 cm in length. The left lobe of the liver is markedly atrophic. The caudate lobe is prominent. No focal liver lesions are appreciable on this noncontrast enhanced study. Gallbladder wall is not appreciably thickened. There is no biliary duct dilatation. Pancreas: There is no pancreatic mass or inflammatory focus. Spleen: Spleen measures 15.8 x 13.3 x 6.6 cm with a measured splenic volume of 693 cubic cm. No focal splenic lesions are evident on this noncontrast enhanced study. Adrenals/Urinary Tract: Adrenals bilaterally appear unremarkable. There are multiple cysts arising from the right kidney, a finding present on previous studies. Largest cyst on the right measures 3.8 x 3.8 cm. There are cystic areas in the left kidney. There is a probable cyst which contains debris arising from the posterior mid left kidney measuring 2.2 x 1.4 cm. This lesion is stable in appearance compared to prior study from 2015. There is no hydronephrosis on either side. There are scattered 1 mm calculi in right kidney. There is a 4 x 4 mm calculus with an adjacent 3 x 2 mm calculus in the upper to  mid right kidney. There are scattered 1-2 mm calculi throughout the left kidney. There is no ureteral calculus on either side. Urinary bladder is midline. No urinary bladder wall lesion is evident. Note that evaluation urinary bladder is limited due to artifact from total hip replacements bilaterally. Stomach/Bowel: Rectum is borderline distended with air and stool. Rectal wall does not appear thickened. Artifact from total  hip replacements makes evaluation of the rectal region less than optimal. There is no appreciable bowel wall or mesenteric thickening. No evident bowel obstruction. No free air or portal venous air. Vascular/Lymphatic: There are varices in the upper abdomen. There is atherosclerotic calcification aorta and common iliac arteries. There is evidence of moderately severe obstruction in the proximal iliac arteries bilaterally due to calcified plaque. Major mesenteric vessels appear patent on this noncontrast enhanced study, although there is moderate calcification in the major mesenteric vessels proximally. There are scattered subcentimeter retroperitoneal lymph nodes. There are small lymph nodes in the periportal region, likely due to underlying cirrhosis. There is no frank adenopathy by size criteria in the abdomen or pelvis. Reproductive: Prostate and seminal vesicles appear grossly normal in size and contour. Artifact from total hip replacements makes evaluation of this area less than optimal. Other: There are surgical clips in the periappendiceal region. There is no periappendiceal region inflammation. There are areas of postoperative change in the pelvis. No hernia is currently seen. No abscess or ascites is evident in the abdomen or pelvis. Musculoskeletal: Total hip replacements noted bilaterally. There are no blastic or lytic bone lesions. There is degenerative change in the lumbar spine. There is no intramuscular or abdominal wall lesion. IMPRESSION: 1. Evidence of hepatic cirrhosis with splenomegaly and upper abdominal varices. Liver is enlarged but unchanged from prior studies. No focal liver lesions are evident on this noncontrast enhanced study. 2. No evident bowel obstruction. No abscess. No periappendiceal region inflammation. 3. Subcentimeter abdominal lymph nodes, most likely secondary to underlying cirrhosis. 4. Probable rounded atelectasis posterior right lung base. Underlying centrilobular  emphysematous change. 5. Aortoiliac atherosclerosis. There appears to be hemodynamically significant obstructive disease in both proximal common iliac arteries due to extensive calcified plaque. 6. Multiple small nonobstructing renal calculi bilaterally. No hydronephrosis or ureteral calculus on either side. 7. Multiple renal cysts, larger on the right than on the left. There is a stable cyst which contains debris in the posterior mid left kidney. Stability since 2015 is felt to be indicative of benign etiology. 8.  Status post total hip replacements bilaterally. 9. Given history of melena, colonoscopy is advised after bowel preparation if direct colonic visualization is not been performed recently. Note that there are areas suggesting previous bowel surgery with patent anastomoses. Aortic Atherosclerosis (ICD10-I70.0) and Emphysema (ICD10-J43.9). Electronically Signed   By: Lowella Grip III M.D.   On: 04/28/2017 21:41   Dg Chest Port 1 View  Result Date: 04/28/2017 CLINICAL DATA:  Sepsis and fever. EXAM: PORTABLE CHEST 1 VIEW COMPARISON:  05/03/2016 chest CT 03/10/2014 chest radiograph FINDINGS: Upper limits normal heart size noted. Mild interstitial opacities in the lower lungs noted. No evidence of pneumothorax, pleural effusion, airspace disease or acute bony abnormality. IMPRESSION: Mild lower lung interstitial opacities which may represent mild interstitial edema. No evidence of focal pneumonia. Electronically Signed   By: Margarette Canada M.D.   On: 04/28/2017 19:38   SIGNIFICANT EVENTS  9/15   Admit with anemia, presyncope, concern for GI bleed.  PRBC 5 units  9/17  EGD planned for 9/18  STUDIES:  CT Abdomen/Pelvis 9/15 >> evidence of hepatic cirrhosis with splenomegaly and upper abdominal varices, liver enlarged but unchanged from prior studies, no evidence of bowel obstruction or abscess, rounded atelectasis in right lung base, centrilobular emphysema, aortoiliac atherosclerosis, multiple small  nonobstructing renal calculi bilaterally  ANTIBIOTICS Vanco 9/15 x1 Zosyn 9/15 x1  Ceftriaxone 9/16 (empiric SBP) >>   CULTURES BCx2 9/15 >>  UC 9/15 >> negative   ASSESSMENT / PLAN:  NEURO: A:  Pre-syncope (resolved) - secondary to profound anemia (improving) ETOH Abuse  P: Monitor in ICU Bed rest with BRP  ETOH abuse counseling  OOB to chair, ok to ambulate with staff.  PT/OT  CARDIAC: A: NSTEMI - demand ischemia  Volume Depletion - resuscitated with PRBC's, IVC on ECHO at bedside appears he is adequately hydrated HYPOtension P: Follow troponin Ensure adequate BP / perfusion  ICU monitoring  Goal MAP >55  TTE showed IVC plump with <50% respiratory variation. This is consistent with not being intravascularly depleted. Therefore will hold off on albumin at this time.  Soft BP (90's/50's) likely related to his cirrhosis; Goal MAP >55  PULMONARY: A: Tobacco Abuse - 1/2ppd currently Centrilobular Emphysema - noted on CT ABD Acute Hypoxic respiratory failure P: O2 as needed to support sats 90-94%; currently 97% on 2L O2 Pulmonary hygiene- IS, mobilize Will need outpatient pulmonary evaluation for COPD  PRN duoneb for SOB  ID: A: Code Sepsis - triggered in ER, doubt infectious process.  Suspect lactic acid was related to anemia  P: Follow cultures  Trend lactic acid > cleared  Empiric rocephin > pending results of EGD, may be able to d/c abx  Endocrine: A: No acute process - TSH wnl  P: Monitor   GI: A: GIB  Hepatic Cirrhosis with Varices - MELD 30 on presentation Weight Loss  P: NPO for EGD Concern for underlying malignancy with smoking hx, weight loss & GIB Monitor for bleeding  PPI BID Octreotide gtt   HEME A: Severe Symptomatic Anemia - in setting of UGIB, admit Hgb 3.3 P: Trend CBC; Hgb now improved to 8.4.  Monitor for bleeding  SCD's for DVT prophylaxis   RENAL A: AKI - likely related to ATN in setting of GIB, hypoperfusion, NSAID  use Renal Cysts - noted on CT, no evidence of hydronephrosis   P: Creatinine improved to 3.29 from 4.57, but up from baseline of 0.8.  Trend BMP / urinary output Replace electrolytes as indicated Avoid nephrotoxic agents, ensure adequate renal perfusion   Noe Gens, NP-C [facility] Pgr: [phone] or if no answer [phone] 04/30/2017, 10:42 AM   Attending Addendum: I personally examined this patient and agree with plan as detailed/amended above. Lungs CTA on my exam but was wheezing later when auscultated by NP. Agree with duonebs PRN. Transfuse as needed for Hgb >8.0 given ongoing active bleeding. Not intravascularly dry so no need for Albumin at this time. Follow-up TTE official report. EGD planned.   40 minutes critical care time  Vernie Murders MD Pulmonary & Critical Care

## 2017-04-30 NOTE — Care Management Note (Signed)
Case Management Note  Patient Details  Name: [name] MRN: [id number] Date of Birth: Dec 27, 1955  Subjective/Objective:   From home alone, pta indep, but has a lady friend, he presents with GIB, NSTEMI, AKI, hgb 3.3, conts on protonix drip, received 5 units prbc's, lactic was elevated, but likely secondary to anemia.  Plan for EGD tomorrow, iv albumin, abx, may consider plt transfusion prior to EGD  , he has advance MELD score, last alcohol use was 6 weeks ago which may prevent liver transplant eval.               Action/Plan: NCM will follow for dc needs.   Expected Discharge Date:                  Expected Discharge Plan:  Home/Self Care  In-House Referral:     Discharge planning Services  CM Consult  Post Acute Care Choice:    Choice offered to:     DME Arranged:    DME Agency:     HH Arranged:    HH Agency:     Status of Service:  In process, will continue to follow  If discussed at Long Length of Stay Meetings, dates discussed:    Additional Comments:  Zenon Mayo, RN 04/30/2017, 11:29 AM

## 2017-04-30 NOTE — Progress Notes (Signed)
Initial Nutrition Assessment  DOCUMENTATION CODES:   Non-severe (moderate) malnutrition in context of chronic illness  INTERVENTION:    Boost Breeze po TID, each supplement provides 250 kcal and 9 grams of protein  NUTRITION DIAGNOSIS:   Malnutrition (moderate) related to chronic illness (hepatic cirrhosis) as evidenced by moderate depletion of body fat, moderate depletions of muscle mass, 24% weight loss x 6 months  GOAL:   Patient will meet greater than or equal to 90% of their needs  MONITOR:   PO intake, Supplement acceptance, Labs, Weight trends, Skin, I & O's  REASON FOR ASSESSMENT:   Malnutrition Screening Tool  ASSESSMENT:   61 yr old Male, smoker, who presented 9/15 with reports of dizziness/lightheadedness, dark stools & 45 lb weight loss in last 2 months.  RD spoke with pt at bedside. Reports a limited appetite. Typically only consumes 1 meal per day. Reveals he started drinking "Boost" nutrition supplements a couple days PTA. Also shares he's lost a severe amount of weight loss (24%) in the last 6 months. UBW is 185 lbs. Labs and medications reviewed. 132 (L). CBG 129.  Nutrition-Focused physical exam completed. Findings are moderate fat depletion, moderate muscle depletion, and no edema.   Diet Order:  Diet clear liquid Room service appropriate? Yes; Fluid consistency: Thin Diet NPO time specified  Skin:  Reviewed, no issues  Last BM:  9/17  Height:   Ht Readings from Last 1 Encounters:  04/29/17 5\' 9"  (1.753 m)   Weight:   Wt Readings from Last 1 Encounters:  04/29/17 141 lb 1.5 oz (64 kg)   Ideal Body Weight:  73 kg  BMI:  Body mass index is 20.84 kg/m.  Estimated Nutritional Needs:   Kcal:  1700-1900  Protein:  95-110 gm  Fluid:  1.7-1.9 L  EDUCATION NEEDS:   No education needs identified at this time  Arthur Holms, RD, LDN Pager #: [phone] After-Hours Pager #: [phone]

## 2017-04-30 NOTE — Anesthesia Preprocedure Evaluation (Addendum)
Anesthesia Evaluation  Patient identified by MRN, date of birth, ID band Patient awake    Reviewed: Allergy & Precautions, NPO status , Patient's Chart, lab work & pertinent test results  History of Anesthesia Complications Negative for: history of anesthetic complications  Airway Mallampati: II  TM Distance: >3 FB Neck ROM: Full    Dental  (+) Edentulous Upper, Edentulous Lower   Pulmonary COPD, Current Smoker,   Will treat with albuterol MDI   + wheezing      Cardiovascular + Past MI   Rhythm:Regular Rate:Normal  04/30/17 ECHO: EF 55-60%, mod MR   Neuro/Psych  Headaches, Anxiety Depression    GI/Hepatic GERD  Medicated and Controlled,(+) Cirrhosis     substance abuse  alcohol use, H/o bowel abcess with perforation s/p repair s/p colostomy, s/p ileostomy post reversion of both   Endo/Other  negative endocrine ROS  Renal/GU Renal InsufficiencyRenal disease (creat 3.29, K+ 4.3)     Musculoskeletal  (+) Arthritis ,   Abdominal   Peds  Hematology  (+) Blood dyscrasia, anemia , Hb 8.4, plt 70k   Anesthesia Other Findings   Reproductive/Obstetrics                           Anesthesia Physical Anesthesia Plan  ASA: III  Anesthesia Plan: MAC   Post-op Pain Management:    Induction: Intravenous  PONV Risk Score and Plan: 0  Airway Management Planned: Natural Airway and Nasal Cannula  Additional Equipment:   Intra-op Plan:   Post-operative Plan:   Informed Consent: I have reviewed the patients History and Physical, chart, labs and discussed the procedure including the risks, benefits and alternatives for the proposed anesthesia with the patient or authorized representative who has indicated his/her understanding and acceptance.     Plan Discussed with: CRNA and Surgeon  Anesthesia Plan Comments: (Plan routine monitors, MAC)        Anesthesia Quick Evaluation

## 2017-05-01 ENCOUNTER — Encounter (HOSPITAL_COMMUNITY): Payer: Self-pay | Admitting: Certified Registered Nurse Anesthetist

## 2017-05-01 ENCOUNTER — Inpatient Hospital Stay (HOSPITAL_COMMUNITY): Payer: Medicare Other | Admitting: Certified Registered Nurse Anesthetist

## 2017-05-01 ENCOUNTER — Inpatient Hospital Stay (HOSPITAL_COMMUNITY): Payer: Medicare Other

## 2017-05-01 ENCOUNTER — Encounter (HOSPITAL_COMMUNITY)
Admission: EM | Disposition: A | Discharge status: Home / Self Care | Payer: Self-pay | Source: Home / Self Care | Attending: Critical Care Medicine

## 2017-05-01 HISTORY — PX: ESOPHAGOGASTRODUODENOSCOPY (EGD) WITH PROPOFOL: SHX5813

## 2017-05-01 LAB — CBC
HCT: 24.1 % — ABNORMAL LOW (ref 39.0–52.0)
HCT: 24.5 % — ABNORMAL LOW (ref 39.0–52.0)
HEMOGLOBIN: 8.3 g/dL — AB (ref 13.0–17.0)
Hemoglobin: 8.1 g/dL — ABNORMAL LOW (ref 13.0–17.0)
MCH: 30.9 pg (ref 26.0–34.0)
MCH: 31.4 pg (ref 26.0–34.0)
MCHC: 33.6 g/dL (ref 30.0–36.0)
MCHC: 33.9 g/dL (ref 30.0–36.0)
MCV: 92 fL (ref 78.0–100.0)
MCV: 92.8 fL (ref 78.0–100.0)
PLATELETS: 68 10*3/uL — AB (ref 150–400)
PLATELETS: 88 10*3/uL — AB (ref 150–400)
RBC: 2.62 MIL/uL — ABNORMAL LOW (ref 4.22–5.81)
RBC: 2.64 MIL/uL — AB (ref 4.22–5.81)
RDW: 17.4 % — AB (ref 11.5–15.5)
RDW: 17.7 % — ABNORMAL HIGH (ref 11.5–15.5)
WBC: 5.6 10*3/uL (ref 4.0–10.5)
WBC: 6.5 10*3/uL (ref 4.0–10.5)

## 2017-05-01 LAB — COMPREHENSIVE METABOLIC PANEL
ALK PHOS: 81 U/L (ref 38–126)
ALT: 39 U/L (ref 17–63)
ANION GAP: 4 — AB (ref 5–15)
AST: 56 U/L — ABNORMAL HIGH (ref 15–41)
Albumin: 2.4 g/dL — ABNORMAL LOW (ref 3.5–5.0)
BUN: 36 mg/dL — ABNORMAL HIGH (ref 6–20)
CALCIUM: 8.8 mg/dL — AB (ref 8.9–10.3)
CO2: 21 mmol/L — AB (ref 22–32)
CREATININE: 2.19 mg/dL — AB (ref 0.61–1.24)
Chloride: 105 mmol/L (ref 101–111)
GFR calc Af Amer: 35 mL/min — ABNORMAL LOW (ref >60)
GFR, EST NON AFRICAN AMERICAN: 30 mL/min — AB (ref >60)
Glucose, Bld: 111 mg/dL — ABNORMAL HIGH (ref 65–99)
Potassium: 4.1 mmol/L (ref 3.5–5.1)
SODIUM: 130 mmol/L — AB (ref 135–145)
TOTAL PROTEIN: 6 g/dL — AB (ref 6.5–8.1)
Total Bilirubin: 3.2 mg/dL — ABNORMAL HIGH (ref 0.3–1.2)

## 2017-05-01 LAB — BPAM PLATELET PHERESIS
BLOOD PRODUCT EXPIRATION DATE: 201809172359
BLOOD PRODUCT EXPIRATION DATE: 201809172359
ISSUE DATE / TIME: 201809171431
ISSUE DATE / TIME: 201809171431
UNIT TYPE AND RH: 7300
Unit Type and Rh: 6200

## 2017-05-01 LAB — PREPARE PLATELET PHERESIS
UNIT DIVISION: 0
Unit division: 0

## 2017-05-01 LAB — PROTIME-INR
INR: 1.58
PROTHROMBIN TIME: 18.7 s — AB (ref 11.4–15.2)

## 2017-05-01 SURGERY — ESOPHAGOGASTRODUODENOSCOPY (EGD) WITH PROPOFOL
Anesthesia: Monitor Anesthesia Care

## 2017-05-01 MED ORDER — PROPOFOL 500 MG/50ML IV EMUL
INTRAVENOUS | Status: DC | PRN
  Administered 2017-05-01: 50 ug/kg/min via INTRAVENOUS

## 2017-05-01 MED ORDER — ZOLPIDEM TARTRATE 5 MG PO TABS
5.0000 mg | ORAL_TABLET | Freq: Every evening | ORAL | Status: DC | PRN
  Administered 2017-05-01 – 2017-05-03 (×3): 5 mg via ORAL
  Filled 2017-05-01 (×3): qty 1

## 2017-05-01 MED ORDER — PROPOFOL 10 MG/ML IV BOLUS
INTRAVENOUS | Status: DC | PRN
  Administered 2017-05-01: 20 mg via INTRAVENOUS

## 2017-05-01 MED ORDER — ALBUTEROL SULFATE HFA 108 (90 BASE) MCG/ACT IN AERS
INHALATION_SPRAY | RESPIRATORY_TRACT | Status: DC | PRN
  Administered 2017-05-01: 4 via RESPIRATORY_TRACT

## 2017-05-01 MED ORDER — PANTOPRAZOLE SODIUM 40 MG IV SOLR
40.0000 mg | INTRAVENOUS | Status: DC
  Administered 2017-05-01 – 2017-05-02 (×2): 40 mg via INTRAVENOUS
  Filled 2017-05-01 (×2): qty 40

## 2017-05-01 MED ORDER — MEPERIDINE HCL 25 MG/ML IJ SOLN: 6.2500 mg | INTRAMUSCULAR | Status: DC | PRN

## 2017-05-01 MED ORDER — PROMETHAZINE HCL 25 MG/ML IJ SOLN: 6.2500 mg | INTRAMUSCULAR | Status: DC | PRN

## 2017-05-01 MED ORDER — MIDAZOLAM HCL 2 MG/2ML IJ SOLN: 0.5000 mg | Freq: Once | INTRAMUSCULAR | Status: DC | PRN

## 2017-05-01 SURGICAL SUPPLY — 14 items

## 2017-05-01 NOTE — Progress Notes (Signed)
Name: [name] MRN: [id number] DOB: [birth year]    ADMISSION DATE:  04/28/2017 CONSULTATION DATE:  04/28/17  REFERRING MD :  ER - PA- Alferd Apa CHIEF COMPLAINT:  Dizziness and melena  BRIEF PATIENT DESCRIPTION: 61 yr old male, smoker, who presented 9/15 with reports of dizziness/lightheadedness, dark stools & 45lb weight loss in last 2 months. Hgb 3.3 on initial CBC  PMHx significant for b/l hip replacement, bowel abcess with perforation s/p repair s/p colostomy, s/p ileostomy post reversion of both.   SUBJECTIVE:  Insomnia overnight > given ambien.  Otherwise no acute events.   VITAL SIGNS: Temp:  [98.1 F (36.7 C)-98.2 F (36.8 C)] 98.1 F (36.7 C) (09/18 0357) Pulse Rate:  [96-109] 102 (09/18 0700) Resp:  [17-28] 23 (09/18 0700) BP: (87-113)/(40-70) 92/54 (09/18 0700) SpO2:  [88 %-98 %] 94 % (09/18 0700) FiO2 (%):  [24 %] 24 % (09/17 1211) Weight:  [65.1 kg (143 lb 8.3 oz)] 65.1 kg (143 lb 8.3 oz) (09/18 0500)  PHYSICAL EXAMINATION:  General: thin adult male in NAD HEENT: MM pink/moist, no jvd PSY: calm/appropriate  Neuro: AAOx3, speech clear, MAE  CV: RRR, ? 2/6 SEM PULM: even/non-labored, faint expiratory wheeze WE:XHBZ, non-tender, bsx4 active  Extremities: warm/dry, no edema  Skin: no rashes or lesions    Recent Labs Lab 04/29/17 1038 04/30/17 0216 05/01/17 0248  NA 133* 132* 130*  K 4.5 4.3 4.1  CL 108 109 105  CO2 20* 20* 21*  BUN 43* 39* 36*  CREATININE 3.84* 3.29* 2.19*  GLUCOSE 119* 110* 111*    Recent Labs Lab 04/29/17 1804 04/30/17 0809 04/30/17 1843  HGB 8.9* 8.4* 8.4*  HCT 25.1* 23.6* 24.7*  WBC 8.2 6.4 7.1  PLT 33* 37* 70*   No results found.   SIGNIFICANT EVENTS  9/15   Admit with anemia, presyncope, concern for GI bleed.  PRBC 5 units  9/17  EGD planned for 9/18  STUDIES:  CT Abdomen/Pelvis 9/15 >> evidence of hepatic cirrhosis with splenomegaly and upper abdominal varices, liver enlarged but unchanged from prior  studies, no evidence of bowel obstruction or abscess, rounded atelectasis in right lung base, centrilobular emphysema, aortoiliac atherosclerosis, multiple small nonobstructing renal calculi bilaterally Echo 9/17 > EF 55 - 60%, mod MR.  ANTIBIOTICS Vanco 9/15 x1 Zosyn 9/15 x1  Ceftriaxone 9/16 (empiric SBP) >>   CULTURES BCx2 9/15 >>  UC 9/15 >> negative   ASSESSMENT / PLAN:  NEURO: A:  Pre-syncope (resolved) - secondary to profound anemia (improving) Insomnia ETOH Abuse  P: Monitor in ICU Bed rest with GI bleed Trazodone qhs ETOH abuse counseling  OOB to chair, ok to ambulate with staff.  PT/OT  CARDIAC: A: NSTEMI - demand ischemia  Volume Depletion - resuscitated with PRBC's, IVC on ECHO at bedside appears he is adequately hydrated HYPOtension P: Follow troponin Ensure adequate BP / perfusion  Accept MAP > 55  Hold off on albumin at this time given dilated IVC on echo suggesting against intravascular volume depletion Soft BP (90's/50's) likely related to his cirrhosis; Goal MAP > 55  PULMONARY: A: Tobacco Abuse - 1/2ppd currently Centrilobular Emphysema - noted on CT ABD Acute Hypoxic respiratory failure P: O2 as needed to support sats 90-94%; currently 97% on 2L O2 Pulmonary hygiene- IS, mobilize Will need outpatient pulmonary evaluation for COPD  PRN duoneb for SOB  ID: A: Code Sepsis - triggered in ER, doubt infectious process.  Suspect lactic acid was related to anemia; now cleared  P: Follow cultures  Empiric rocephin > pending results of EGD, may be able to d/c abx  Endocrine: A: No acute process - TSH wnl  P: Monitor   GI: A: GIB  Hepatic Cirrhosis with Varices - MELD 30 on presentation Weight Loss  P: NPO for EGD planned for today 9/18 Concern for underlying malignancy with smoking hx, weight loss & GIB Monitor for bleeding  PPI BID Octreotide gtt   HEME A: Severe Symptomatic Anemia - in setting of UGIB, admit Hgb 3.3; currently  8.4 AM 9/18 Thrombocytopenia - presumed due to bone marrow suppression in setting EtOH abuse P: Trend CBC; Hgb now improved to 8.4 Monitor for bleeding  SCD's for DVT prophylaxis  GI following, planning for EGD 9/18 Defer platelet transfusion prior to EGD to GI  RENAL A: Hyponatremia - chronic; beer potomania. AKI - likely related to ATN in setting of GIB, hypoperfusion, NSAID use.  Improving. Renal Cysts - noted on CT, no evidence of hydronephrosis   P: Trend BMP / urinary output Replace electrolytes as indicated Avoid nephrotoxic agents, ensure adequate renal perfusion   Montey Hora, PA - C [facility] Pager: [phone]  or [phone] 05/01/2017, 7:47 AM   Attending Addendum: I personally examined this patient and agree with plan as detailed/amended above. Lungs with slight wheezing on exam today but patient asymptomatic; has PRN Duonebs. Transfuse as needed for Hgb >8.0 given ongoing active bleeding. Hgb 8.4 and stable. TTE yesterday showed normal LVEF and normal RV. EGD today showed nonbleeding small EV's but now active bleeding and no ulcers. Stopped octreotide gtt. Decreased IV PPI from BID to daily. GI has okayed patient for liquid diet. Per family he will get a colonoscopy tomorrow; have not heard this from GI yet. Awaiting their rec's. Pt with insomnia last night; no relief with trazodone but did well with ambien. Thrombocytopenia related to his cirrhosis and is improved today. Hyponatremia slightly worse at 130 from 132; continue to monitor. AKI improving now 2.19 from 3.29, likely ATN from the severe anemia. Has good UOP. NSTEMI with Trop peaking at 4.11 now down to 2.92; asymptomatic and no wall motion abnormality on TTE. Hypoxic respiratory failure: post EGD was extubated to 2L O2 but with Pox 98% on that 2L O2. Weaned to 1L O2. Hope to wean off. Goal Pox 90-94% given his COPD. Normotensive; in this cirrhotic patient goal MAP >55.  Patient stable for transfer out of ICU at this point.    Spent 30 minutes with this patient; S3 visit.   Vernie Murders MD Pulmonary & Critical Care

## 2017-05-01 NOTE — Brief Op Note (Signed)
04/28/2017 - 05/01/2017  9:38 AM  PATIENT:  [name]  61 y.o. male  PRE-OPERATIVE DIAGNOSIS:  melena, anemia  POST-OPERATIVE DIAGNOSIS:  no active bleeding, small esophageal varices  PROCEDURE:  Procedure(s): ESOPHAGOGASTRODUODENOSCOPY (EGD) WITH PROPOFOL (N/A)  SURGEON:  Surgeon(s) and Role:    * Imanol Bihl, MD - Primary  Findings/recommendations ------------------------------------- - EGD showed one channel of a small esophageal varices without any stigmata or active bleeding. - NO Evidence of gastric ulcer, gastric varices. - DC octreotide. Continue antibiotics while in the hospital. Change PPI to once a day - Patient presented with hemoglobin of 3.3. No evidence of bleeding on upper endoscopy. Offered colonoscopy for further evaluation of lower GI tract. Patient declined at this time. He wants to think about it and let us know by tomorrow. - Okay to have full liquid diet today. - GI will follow    [name] MD, [place] 05/01/2017, 9:40 AM  Pager [phone]  If no answer or after 5 PM call [phone]

## 2017-05-01 NOTE — Progress Notes (Signed)
PT Cancellation Note  Patient Details Name: [name] MRN: [id number] DOB: 1956/03/22   Cancelled Treatment:    Reason Eval/Treat Not Completed: Medical issues which prohibited therapy (Pt in Endo.  Will check back as able.  Thanks. )   Godfrey Pick Emari Hreha 05/01/2017, 8:47 AM [facility] [phone] (pager)

## 2017-05-01 NOTE — Transfer of Care (Signed)
Immediate Anesthesia Transfer of Care Note  Patient: [name]  Procedure(s) Performed: Procedure(s): ESOPHAGOGASTRODUODENOSCOPY (EGD) WITH PROPOFOL (N/A)  Patient Location: PACU  Anesthesia Type:MAC  Level of Consciousness: awake and alert   Airway & Oxygen Therapy: Patient Spontanous Breathing and Patient connected to nasal cannula oxygen  Post-op Assessment: Report given to RN and Post -op Vital signs reviewed and stable  Post vital signs: Reviewed and stable  Last Vitals:  Vitals:   05/01/17 0811 05/01/17 0815  BP:  (!) 96/53  Pulse: (!) 110 (!) 109  Resp: (!) 30 (!) 30  Temp: 36.7 C   SpO2: 96% 96%    Last Pain:  Vitals:   05/01/17 0811  TempSrc: Oral  PainSc: 0-No pain         Complications: No apparent anesthesia complications

## 2017-05-01 NOTE — Progress Notes (Signed)
(  covering for Dr. Alessandra Bevels)  Received call from [place] on 4E and [place] on [place] that pt is now agreeable to, and desirous of, having colonoscopy tomorrrow.  I reached Dr. Alessandra Bevels, who feels that it is a bit on the late side to try to get pt optimally prepped for a colonoscopy tomorrow, and since pt is not actively bleeding, there is no clinical urgency to doing procedure.  Therefore, Dr. Alessandra Bevels will plan to talk to pt tomorrow about possibly setting up colonoscopy for Thursday.  Cleotis Nipper, M.D. Pager [phone] If no answer or after 5 PM call [phone]

## 2017-05-01 NOTE — Progress Notes (Signed)
[facility] Gastroenterology Progress Note  [name] 61 y.o. 1956/02/07  CC:  Symptomatic anemia   Subjective: Patient feeling better. Denied abdominal pain, nausea or vomiting. Baseline shortness of breath. Echo yesterday showed EF of 55-60%.  ROS : Negative for active chest pain but positive for shortness of breath. Positive for weakness.   Objective: Vital signs in last 24 hours: Vitals:   05/01/17 0700 05/01/17 0800  BP: (!) 92/54 (!) 102/54  Pulse: (!) 102 (!) 107  Resp: (!) 23 (!) 29  Temp:    SpO2: 94% 96%    Physical Exam:  General:  Alert, cooperative, no distress,   Head:  Normocephalic, without obvious abnormality, atraumatic  Eyes:  , EOM's intact, No scleral icterus   Lungs:   Anterior exam only. Decreased breath sounds bilaterally, respirations unlabored  Heart:  Tachycardic, S1, S2 normal  Abdomen:   Soft, non-tender, bowel sounds active all four quadrants,  no masses,   Extremities: Extremities normal, atraumatic,        Lab Results:  Recent Labs  04/30/17 0216 05/01/17 0248  NA 132* 130*  K 4.3 4.1  CL 109 105  CO2 20* 21*  GLUCOSE 110* 111*  BUN 39* 36*  CREATININE 3.29* 2.19*  CALCIUM 9.6 8.8*    Recent Labs  04/28/17 1910 05/01/17 0248  AST 100* 56*  ALT 53 39  ALKPHOS 122 81  BILITOT 2.1* 3.2*  PROT 6.8 6.0*  ALBUMIN 2.9* 2.4*    Recent Labs  04/28/17 1910 04/29/17 0649  04/30/17 0809 04/30/17 1843  WBC 8.1 7.9  < > 6.4 7.1  NEUTROABS 4.7 4.7  --   --   --   HGB 3.3* 7.4*  < > 8.4* 8.4*  HCT 9.2* 20.9*  < > 23.6* 24.7*  MCV 89.3 87.1  < > 88.1 90.1  PLT 39* 32*  < > 37* 70*  < > = values in this interval not displayed.  Recent Labs  04/29/17 0146 05/01/17 0248  LABPROT 18.6* 18.7*  INR 1.56 1.58      Assessment/Plan: - Symptomatic anemia with possible upper GI bleed. Hemoglobin stable after blood transfusion. - Cirrhosis with meld score of 28 as of 04/29/2017  - Elevated troponins. Echo showed EF of 55 to 60  %  - Acute kidney injury- improving - Thrombocytopenia - status post transfusion yesterday  Recommendations -------------------------- - Hemoglobin stable. No further bleeding episodes. EGD today for further evaluation. Risk benefits alternatives discussed with the patient and wife. They verbalized understanding. - Trail of albumin for  possible hepatorenal syndrome was discontinued but his kidney functions are improving now. - Continue antibiotic and PPI for now. Continue octreotide. - Further plan based on EGD findings   [name] MD, FACP 05/01/2017, 8:13 AM  Pager [phone]  If no answer or after 5 PM call [phone]

## 2017-05-01 NOTE — Op Note (Signed)
[facility] Patient Name: [name] Procedure Date : 05/01/2017 MRN: [id number] Attending MD: [name] , MD Date of Birth: 11-18-1955 CSN: [id number] Age: 61 Admit Type: Inpatient Procedure:                Upper GI endoscopy Indications:              Suspected upper gastrointestinal bleeding Providers:                [name], MD, Debara Pickett., Technician, Rejeana Brock, CRNA Referring MD:              Medicines:                Sedation Administered by an Anesthesia Professional Complications:            No immediate complications. Estimated Blood Loss:     Estimated blood loss: none. Procedure:                Pre-Anesthesia Assessment:                           - Prior to the procedure, a History and Physical                            was performed, and patient medications and                            allergies were reviewed. The patient's tolerance of                            previous anesthesia was also reviewed. The risks                            and benefits of the procedure and the sedation                            options and risks were discussed with the patient.                            All questions were answered, and informed consent                            was obtained. Prior Anticoagulants: The patient has                            taken no previous anticoagulant or antiplatelet                            agents. ASA Grade Assessment: III - A patient with                            severe systemic disease. After reviewing the risks  and benefits, the patient was deemed in                            satisfactory condition to undergo the procedure.                           After obtaining informed consent, the endoscope was                            passed under direct vision. Throughout the                            procedure, the patient's blood  pressure, pulse, and                            oxygen saturations were monitored continuously. The                            [id number] ([id number]) scope was introduced through the                            mouth, and advanced to the second part of duodenum.                            The upper GI endoscopy was technically difficult                            and complex due to the patient's discomfort during                            the procedure. Successful completion of the                            procedure was aided by increasing the dose of                            sedation medication. The patient tolerated the                            procedure well. Scope In: Scope Out: Findings:      One column of non-bleeding grade I varices were found in the distal       esophagus,. No stigmata of recent bleeding were evident and no red wale       signs were present.      A small hiatal hernia was present.      There is no endoscopic evidence of bleeding, ulceration or varices in       the entire examined stomach.      The cardia and gastric fundus were normal on retroflexion.      The duodenal bulb, first portion of the duodenum and second portion of       the duodenum were normal. Impression:               - Non-bleeding grade I esophageal varices.                           -  Small hiatal hernia.                           - Normal duodenal bulb, first portion of the                            duodenum and second portion of the duodenum.                           - No specimens collected. Moderate Sedation:      Moderate (conscious) sedation was personally administered by an       anesthesia professional. The following parameters were monitored: oxygen       saturation, heart rate, blood pressure, and response to care. Recommendation:           - Return patient to hospital ward for ongoing care.                           - Clear liquid diet.                           - Continue  present medications. Procedure Code(s):        --- Professional ---                           [phone], Esophagogastroduodenoscopy, flexible,                            transoral; diagnostic, including collection of                            specimen(s) by brushing or washing, when performed                            (separate procedure) Diagnosis Code(s):        --- Professional ---                           I85.00, Esophageal varices without bleeding                           K44.9, Diaphragmatic hernia without obstruction or                            gangrene CPT copyright 2016 American Medical Association. All rights reserved. The codes documented in this report are preliminary and upon coder review may  be revised to meet current compliance requirements. [name], MD [name], MD 05/01/2017 9:22:15 AM Number of Addenda: 0

## 2017-05-01 NOTE — Anesthesia Postprocedure Evaluation (Signed)
Anesthesia Post Note  Patient: JONATHAN CORPUS  Procedure(s) Performed: Procedure(s) (LRB): ESOPHAGOGASTRODUODENOSCOPY (EGD) WITH PROPOFOL (N/A)     Patient location during evaluation: Endoscopy Anesthesia Type: MAC Level of consciousness: awake and alert, patient cooperative and oriented Pain management: pain level controlled Vital Signs Assessment: post-procedure vital signs reviewed and stable Respiratory status: spontaneous breathing, nonlabored ventilation, respiratory function stable and patient connected to nasal cannula oxygen Cardiovascular status: blood pressure returned to baseline and stable Postop Assessment: no apparent nausea or vomiting Anesthetic complications: no    Last Vitals:  Vitals:   05/01/17 1000 05/01/17 1030  BP: (!) 103/45 102/81  Pulse: (!) 111 (!) 113  Resp: (!) 22 18  Temp:    SpO2: 98% 97%    Last Pain:  Vitals:   05/01/17 0924  TempSrc: Oral  PainSc:                  Saadiq Poche,E. Ismail Graziani

## 2017-05-02 ENCOUNTER — Encounter (HOSPITAL_COMMUNITY): Payer: Self-pay | Admitting: Gastroenterology

## 2017-05-02 DIAGNOSIS — R0902 Hypoxemia: Secondary | ICD-10-CM

## 2017-05-02 DIAGNOSIS — D62 Acute posthemorrhagic anemia: Principal | ICD-10-CM

## 2017-05-02 DIAGNOSIS — E876 Hypokalemia: Secondary | ICD-10-CM

## 2017-05-02 LAB — BLOOD CULTURE ID PANEL (REFLEXED)
ACINETOBACTER BAUMANNII: NOT DETECTED
CANDIDA KRUSEI: NOT DETECTED
CANDIDA PARAPSILOSIS: NOT DETECTED
CARBAPENEM RESISTANCE: NOT DETECTED
Candida albicans: NOT DETECTED
Candida glabrata: NOT DETECTED
Candida tropicalis: NOT DETECTED
ENTEROBACTERIACEAE SPECIES: NOT DETECTED
ENTEROCOCCUS SPECIES: NOT DETECTED
Enterobacter cloacae complex: NOT DETECTED
Escherichia coli: NOT DETECTED
Haemophilus influenzae: NOT DETECTED
KLEBSIELLA OXYTOCA: NOT DETECTED
KLEBSIELLA PNEUMONIAE: NOT DETECTED
LISTERIA MONOCYTOGENES: NOT DETECTED
Methicillin resistance: NOT DETECTED
Neisseria meningitidis: NOT DETECTED
PSEUDOMONAS AERUGINOSA: NOT DETECTED
Proteus species: NOT DETECTED
SERRATIA MARCESCENS: NOT DETECTED
STAPHYLOCOCCUS AUREUS BCID: NOT DETECTED
STAPHYLOCOCCUS SPECIES: NOT DETECTED
STREPTOCOCCUS AGALACTIAE: NOT DETECTED
Streptococcus pneumoniae: NOT DETECTED
Streptococcus pyogenes: NOT DETECTED
Streptococcus species: NOT DETECTED
VANCOMYCIN RESISTANCE: NOT DETECTED

## 2017-05-02 LAB — CBC
HCT: 23.9 % — ABNORMAL LOW (ref 39.0–52.0)
Hemoglobin: 7.9 g/dL — ABNORMAL LOW (ref 13.0–17.0)
MCH: 30.7 pg (ref 26.0–34.0)
MCHC: 33.1 g/dL (ref 30.0–36.0)
MCV: 93 fL (ref 78.0–100.0)
Platelets: 91 10*3/uL — ABNORMAL LOW (ref 150–400)
RBC: 2.57 MIL/uL — AB (ref 4.22–5.81)
RDW: 17.9 % — AB (ref 11.5–15.5)
WBC: 7.6 10*3/uL (ref 4.0–10.5)

## 2017-05-02 LAB — BASIC METABOLIC PANEL
Anion gap: 4 — ABNORMAL LOW (ref 5–15)
BUN: 26 mg/dL — ABNORMAL HIGH (ref 6–20)
CO2: 22 mmol/L (ref 22–32)
CREATININE: 1.42 mg/dL — AB (ref 0.61–1.24)
Calcium: 8.4 mg/dL — ABNORMAL LOW (ref 8.9–10.3)
Chloride: 108 mmol/L (ref 101–111)
GFR calc Af Amer: 59 mL/min — ABNORMAL LOW (ref >60)
GFR calc non Af Amer: 51 mL/min — ABNORMAL LOW (ref >60)
Glucose, Bld: 126 mg/dL — ABNORMAL HIGH (ref 65–99)
POTASSIUM: 3.4 mmol/L — AB (ref 3.5–5.1)
Sodium: 134 mmol/L — ABNORMAL LOW (ref 135–145)

## 2017-05-02 LAB — MAGNESIUM: Magnesium: 1.1 mg/dL — ABNORMAL LOW (ref 1.7–2.4)

## 2017-05-02 LAB — PHOSPHORUS: Phosphorus: 2 mg/dL — ABNORMAL LOW (ref 2.5–4.6)

## 2017-05-02 MED ORDER — MAGNESIUM SULFATE 2 GM/50ML IV SOLN
2.0000 g | Freq: Once | INTRAVENOUS | Status: AC
  Administered 2017-05-02: 2 g via INTRAVENOUS
  Filled 2017-05-02: qty 50

## 2017-05-02 MED ORDER — SODIUM PHOSPHATES 45 MMOLE/15ML IV SOLN
10.0000 mmol | Freq: Once | INTRAVENOUS | Status: DC
  Filled 2017-05-02: qty 3.33

## 2017-05-02 MED ORDER — PEG 3350-KCL-NA BICARB-NACL 420 G PO SOLR
4000.0000 mL | Freq: Once | ORAL | Status: AC
  Administered 2017-05-02: 4000 mL via ORAL
  Filled 2017-05-02: qty 4000

## 2017-05-02 MED ORDER — SODIUM PHOSPHATES 45 MMOLE/15ML IV SOLN
20.0000 mmol | Freq: Once | INTRAVENOUS | Status: AC
  Administered 2017-05-02: 20 mmol via INTRAVENOUS
  Filled 2017-05-02: qty 6.67

## 2017-05-02 MED ORDER — POTASSIUM CHLORIDE CRYS ER 20 MEQ PO TBCR
40.0000 meq | EXTENDED_RELEASE_TABLET | Freq: Once | ORAL | Status: AC
  Administered 2017-05-02: 40 meq via ORAL
  Filled 2017-05-02: qty 2

## 2017-05-02 NOTE — Progress Notes (Signed)
[facility] Gastroenterology Progress Note  [name] 61 y.o. 1956-05-18  CC:  Symptomatic anemia   Subjective: Patient feeling better. Denied abdominal pain, nausea or vomiting. Baseline shortness of breath. Denied further bleeding episodes. He is willing to go for colonoscopy.  ROS : Negative for active chest pain . Shortness of breath improving. Positive for weakness.   Objective: Vital signs in last 24 hours: Vitals:   05/02/17 0435 05/02/17 0441  BP:  117/61  Pulse:    Resp:  (!) 26  Temp: 100 F (37.8 C)   SpO2:  95%    Physical Exam:  General:  Alert, cooperative, no distress,   Head:  Normocephalic, without obvious abnormality, atraumatic  Eyes:  , EOM's intact, No scleral icterus   Lungs:   Anterior exam only. Decreased breath sounds bilaterally, respirations unlabored  Heart:  Tachycardic, S1, S2 normal  Abdomen:   Soft, non-tender, bowel sounds active all four quadrants,  no masses,   Extremities: Extremities normal, atraumatic,        Lab Results:  Recent Labs  05/01/17 0248 05/02/17 0330  NA 130* 134*  K 4.1 3.4*  CL 105 108  CO2 21* 22  GLUCOSE 111* 126*  BUN 36* 26*  CREATININE 2.19* 1.42*  CALCIUM 8.8* 8.4*  MG  --  1.1*  PHOS  --  2.0*    Recent Labs  05/01/17 0248  AST 56*  ALT 39  ALKPHOS 81  BILITOT 3.2*  PROT 6.0*  ALBUMIN 2.4*    Recent Labs  05/01/17 1825 05/02/17 0738  WBC 6.5 7.6  HGB 8.3* 7.9*  HCT 24.5* 23.9*  MCV 92.8 93.0  PLT 88* 91*    Recent Labs  05/01/17 0248  LABPROT 18.7*  INR 1.58      Assessment/Plan: - Symptomatic anemia With hemoglobin of 3.3 on presentation with possible upper GI bleed. Hemoglobin stable after blood transfusion. - Cirrhosis with meld score of 28 as of 04/29/2017  - Elevated troponins. Echo showed EF of 55 to 60 %  - Acute kidney injury- improving - Thrombocytopenia - improving  Recommendations -------------------------- - EGD was negative for any active bleeding  yesterday. - Patient is agreeable for colonoscopy for further evaluation of symptomatic anemia. Risk benefits alternatives discussed with the patient. He verbalized understanding. Clear liquid diet today. Start colonoscopy prep today and plan for colonoscopy tomorrow. - GI will follow.   [name] MD, [place] 05/02/2017, 9:49 AM  Pager [phone]  If no answer or after 5 PM call [phone]

## 2017-05-02 NOTE — Evaluation (Signed)
Physical Therapy Evaluation Patient Details Name: [name] MRN: [id number] DOB: 06-11-1956 Today's Date: 05/02/2017   History of Present Illness  61 yr old male, smoker, who presented 9/15 with reports of dizziness/lightheadedness, dark stools & 45lb weight loss in last 2 months. Hgb 3.3 on initial CBC. PMHx significant for b/l hip replacement, bowel abcess with perforation s/p repair s/p colostomy, s/p ileostomy post reversion of both.  Clinical Impression  Pt admitted with above diagnosis. Pt currently with functional limitations due to the deficits listed below (see PT Problem List). Pt is limited by fatigue and generalized weakness. Pt currently minA for bed mobility, and min guard for transfers and ambulation of 150 feet with RW.  Pt will benefit from skilled PT to increase their independence and safety with mobility to allow discharge to the venue listed below.       Follow Up Recommendations Home health PT;Supervision - Intermittent    Equipment Recommendations  None recommended by PT    Recommendations for Other Services       Precautions / Restrictions Precautions Precautions: Fall Restrictions Weight Bearing Restrictions: No      Mobility  Bed Mobility Overal bed mobility: Needs Assistance Bed Mobility: Supine to Sit     Supine to sit: Min assist     General bed mobility comments: minA for trunk to upright, pt able to manage LE to floor, vc for hand placement on bedrails to pull to EoB  Transfers Overall transfer level: Needs assistance Equipment used: Rolling walker (2 wheeled) Transfers: Sit to/from Stand Sit to Stand: Min guard         General transfer comment: min guard for safety, good power up and steadying in standing  Ambulation/Gait Ambulation/Gait assistance: Min guard Ambulation Distance (Feet): 150 Feet Assistive device: Rolling walker (2 wheeled) Gait Pattern/deviations: Decreased step length - right;Decreased step length -  left;Step-through pattern;Shuffle Gait velocity: slowed Gait velocity interpretation: Below normal speed for age/gender General Gait Details: min guard for safety, good upright posture, pt with 3/4 DoE with ambulation vc for RW management and deep breathing, pt used to 4 wheeled walker at home     Balance Overall balance assessment: Needs assistance Sitting-balance support: Feet supported Sitting balance-Leahy Scale: Fair Sitting balance - Comments: able to maintain static seated balance   Standing balance support: Bilateral upper extremity supported Standing balance-Leahy Scale: Fair Standing balance comment: requires RW assist to maintain balance                             Pertinent Vitals/Pain Pain Assessment: No/denies pain VSS    Home Living Family/patient expects to be discharged to:: Private residence Living Arrangements: Alone Available Help at Discharge: Available PRN/intermittently;Friend(s) Type of Home: Mobile home Home Access: Stairs to enter Entrance Stairs-Rails: Can reach both Entrance Stairs-Number of Steps: 5 Home Layout: One level Home Equipment: Walker - 4 wheels      Prior Function Level of Independence: Independent         Comments: independent community ambulator, driver, and independent ADLs and iADLs     Hand Dominance   Dominant Hand: Right    Extremity/Trunk Assessment   Upper Extremity Assessment Upper Extremity Assessment: Generalized weakness    Lower Extremity Assessment Lower Extremity Assessment: Generalized weakness       Communication   Communication: No difficulties  Cognition Arousal/Alertness: Awake/alert Behavior During Therapy: WFL for tasks assessed/performed Overall Cognitive Status: Within Functional Limits for tasks assessed  General Comments General comments (skin integrity, edema, etc.): Pt on 2L O2 via nasal cannula at entry SaO2 98%O2,  does not wear O2 at home, able to ambulate on RA at 95%O2 despite 3/4 dyspnea, left supplemental O2 off at end of session and notified nursing        Assessment/Plan    PT Assessment Patient needs continued PT services  PT Problem List Decreased strength;Decreased activity tolerance;Decreased mobility;Decreased knowledge of use of DME;Cardiopulmonary status limiting activity       PT Treatment Interventions DME instruction;Gait training;Stair training;Functional mobility training;Therapeutic activities;Therapeutic exercise;Balance training;Patient/family education    PT Goals (Current goals can be found in the Care Plan section)  Acute Rehab PT Goals Patient Stated Goal: go home PT Goal Formulation: With patient Time For Goal Achievement: 05/16/17 Potential to Achieve Goals: Good    Frequency Min 3X/week   Barriers to discharge Decreased caregiver support only occasional assist available       AM-PAC PT "6 Clicks" Daily Activity  Outcome Measure Difficulty turning over in bed (including adjusting bedclothes, sheets and blankets)?: A Little Difficulty moving from lying on back to sitting on the side of the bed? : Unable Difficulty sitting down on and standing up from a chair with arms (e.g., wheelchair, bedside commode, etc,.)?: A Little Help needed moving to and from a bed to chair (including a wheelchair)?: A Little Help needed walking in hospital room?: A Little Help needed climbing 3-5 steps with a railing? : A Lot 6 Click Score: 15    End of Session Equipment Utilized During Treatment: Gait belt Activity Tolerance: Patient tolerated treatment well Patient left: in chair;with call bell/phone within reach;with family/visitor present Nurse Communication: Mobility status PT Visit Diagnosis: Other abnormalities of gait and mobility (R26.89);Muscle weakness (generalized) (M62.81);Difficulty in walking, not elsewhere classified (R26.2)    Time: 1103-1130 PT Time  Calculation (min) (ACUTE ONLY): 27 min   Charges:   PT Evaluation $PT Eval Low Complexity: 1 Low PT Treatments $Gait Training: 8-22 mins   PT G Codes:        Kadee Philyaw B. Migdalia Dk PT, DPT Acute Rehabilitation  [phone] Pager [phone]    [place] 05/02/2017, 12:25 PM

## 2017-05-02 NOTE — Progress Notes (Signed)
Name: [name] MRN: [id number] DOB: Aug 13, 1956    ADMISSION DATE:  04/28/2017 CONSULTATION DATE:  04/28/17  REFERRING MD :  ER - PA- Alferd Apa CHIEF COMPLAINT:  Dizziness and melena  BRIEF PATIENT DESCRIPTION: 61 yr old male, smoker, who presented 9/15 with reports of dizziness/lightheadedness, dark stools & 45lb weight loss in last 2 months. Hgb 3.3 on initial CBC  PMHx significant for b/l hip replacement, bowel abcess with perforation s/p repair s/p colostomy, s/p ileostomy post reversion of both.   SUBJECTIVE:  EGD 9/18 > small esophageal varices without stigmata of bleeding, no evidence gastric ulcer or gastric varices.  Planning for colonoscopy 9/20.   VITAL SIGNS: Temp:  [98.6 F (37 C)-100 F (37.8 C)] 100 F (37.8 C) (09/19 0435) Pulse Rate:  [107-113] 110 (09/18 2005) Resp:  [18-29] 26 (09/19 0441) BP: (90-117)/(51-89) 117/61 (09/19 0441) SpO2:  [95 %-100 %] 95 % (09/19 0441) FiO2 (%):  [28 %] 28 % (09/18 1040)  PHYSICAL EXAMINATION:  General: thin adult male in NAD HEENT: MM pink/moist, no jvd PSY: calm/appropriate  Neuro: AAOx3, speech clear, MAE  CV: RRR, ? 2/6 SEM PULM: even/non-labored respirations, CTAB WS:FKCL, non-tender, bsx4 active  Extremities: warm/dry, no edema  Skin: no rashes or lesions    Recent Labs Lab 04/30/17 0216 05/01/17 0248 05/02/17 0330  NA 132* 130* 134*  K 4.3 4.1 3.4*  CL 109 105 108  CO2 20* 21* 22  BUN 39* 36* 26*  CREATININE 3.29* 2.19* 1.42*  GLUCOSE 110* 111* 126*    Recent Labs Lab 05/01/17 1129 05/01/17 1825 05/02/17 0738  HGB 8.1* 8.3* 7.9*  HCT 24.1* 24.5* 23.9*  WBC 5.6 6.5 7.6  PLT 68* 88* 91*   Dg Chest Port 1 View  Result Date: 05/01/2017 CLINICAL DATA:  Shortness of breath. EXAM: PORTABLE CHEST 1 VIEW COMPARISON:  04/28/2017. FINDINGS: Cardiomegaly. Diffuse bilateral pulmonary infiltrates consistent with pulmonary edema noted. Bilateral pleural effusions. These findings are new from  prior exam. No pneumothorax . IMPRESSION: Congestive heart failure with bilateral pulmonary edema and bilateral pleural effusions. Electronically Signed   By: Marcello Moores  Register   On: 05/01/2017 07:56     SIGNIFICANT EVENTS  9/15   Admit with anemia, presyncope, concern for GI bleed.  PRBC 5 units  9/18 EGD > small esophageal varices with no stigmata of bleeding.  No gastric ulcer or varices. 9/20 colonoscopy >  STUDIES:  CT Abdomen/Pelvis 9/15 >> evidence of hepatic cirrhosis with splenomegaly and upper abdominal varices, liver enlarged but unchanged from prior studies, no evidence of bowel obstruction or abscess, rounded atelectasis in right lung base, centrilobular emphysema, aortoiliac atherosclerosis, multiple small nonobstructing renal calculi bilaterally Echo 9/17 > EF 55 - 60%, mod MR.  ANTIBIOTICS Vanco 9/15 x1 Zosyn 9/15 x1  Ceftriaxone 9/16 (empiric SBP) > 9/19  CULTURES BCx2 9/15 >>  UC 9/15 >> negative   ASSESSMENT / PLAN:  GIB - EGD 9/18 with small esophageal varices but negative for bleed.  Colonoscopy planned 9/20. Hepatic Cirrhosis with Varices - MELD 30 on presentation Weight Loss  P: Clear liquids then bowel prep tonight for planned colonoscopy 9/20 Concern for underlying malignancy with smoking hx, weight loss & GIB Monitor for bleeding  Stop octreotide, ceftriaxone Continue PPI  Severe Symptomatic Anemia - in setting of UGIB, admit Hgb 3.3; currently 8.4 AM 9/18 Thrombocytopenia - presumed due to bone marrow suppression in setting EtOH abuse P: Transfuse for Hgb < 7 Monitor for bleeding  SCD's  for DVT prophylaxis  GI following, planning for colonoscopy 9/20  NSTEMI - demand ischemia  Volume Depletion - resuscitated with PRBC's, IVC on ECHO at bedside appears he is adequately hydrated HYPOtension P: Accept MAP > 55  Hold off on albumin at this time given dilated IVC on echo suggesting against intravascular volume depletion Soft BP (90's/50's) likely  related to his cirrhosis; Goal MAP > 55  Tobacco Abuse - 1/2ppd currently Centrilobular Emphysema - noted on CT ABD Acute Hypoxic respiratory failure P: O2 as needed to support sats 90-94% Pulmonary hygiene- IS, mobilize Will need outpatient pulmonary evaluation for COPD  PRN duoneb for SOB  Hyponatremia - chronic; beer potomania. AKI - likely related to ATN in setting of GIB, hypoperfusion, NSAID use.  Improving. Renal Cysts - noted on CT, no evidence of hydronephrosis   Hypokalemia Hypomagnesemia Hypophosphatemia P: Trend BMP / urinary output Replace electrolytes as indicated Avoid nephrotoxic agents, ensure adequate renal perfusion 40 mEq K  2g mag 65mmol Na phos  Pre-syncope (resolved) - secondary to profound anemia (improving) Insomnia ETOH Abuse  P: Trazodone qhs ETOH abuse counseling  OOB to chair, ok to ambulate with staff PT/OT  Will ask TRH to assume care starting in AM 9/20 and PCCM off at that time.     Montey Hora, [facility] Pager: [phone]  or [phone] 05/02/2017, 10:08 AM  Attending Note:  61 year old male with extensive PMH above presenting with GI bleeding and syncope.  Patient was admitted to the ICU for observation and GI was called.  On exam, much improved and GI is following with clear lungs.  I reviewed CXR myself, no acute disease noted.  Labs reviewed with lo K, Mg and Phos.  Discussed with PCCM-NP.  GI bleed:  - Appreciate input from GI  - Colonoscopy per GI  Hemorrhagic anemia:  - CBC as ordered  - Transfuse if <7  Cirrhosis:  - Follow clinically  Hypokalemia  - Replace  - Recheck  Hypomag:  - Replace  - Recheck  Hypophos  - Replace  - Recheck  Hypoxemia:  - Titrate O2 for sat of 88-92%.  Maintain in SDU  Transfer care to [facility] with PCCM off 9/20.  Patient seen and examined, agree with above note.  I dictated the care and orders written for this patient under my  direction.  Rush Farmer, [place]

## 2017-05-02 NOTE — Care Management Important Message (Signed)
Important Message  Patient Details  Name: [name] MRN: [id number] Date of Birth: 03-28-1956   Medicare Important Message Given:  Yes    Nathen May 05/02/2017, 9:55 AM

## 2017-05-02 NOTE — Progress Notes (Signed)
PHARMACY - PHYSICIAN COMMUNICATION CRITICAL VALUE ALERT - BLOOD CULTURE IDENTIFICATION (BCID)  Results for orders placed or performed during the hospital encounter of 04/28/17  Blood Culture ID Panel (Reflexed) (Collected: 04/28/2017  7:55 PM)  Result Value Ref Range   Enterococcus species NOT DETECTED NOT DETECTED   Vancomycin resistance NOT DETECTED NOT DETECTED   Listeria monocytogenes NOT DETECTED NOT DETECTED   Staphylococcus species NOT DETECTED NOT DETECTED   Staphylococcus aureus NOT DETECTED NOT DETECTED   Methicillin resistance NOT DETECTED NOT DETECTED   Streptococcus species NOT DETECTED NOT DETECTED   Streptococcus agalactiae NOT DETECTED NOT DETECTED   Streptococcus pneumoniae NOT DETECTED NOT DETECTED   Streptococcus pyogenes NOT DETECTED NOT DETECTED   Acinetobacter baumannii NOT DETECTED NOT DETECTED   Enterobacteriaceae species NOT DETECTED NOT DETECTED   Enterobacter cloacae complex NOT DETECTED NOT DETECTED   Escherichia coli NOT DETECTED NOT DETECTED   Klebsiella oxytoca NOT DETECTED NOT DETECTED   Klebsiella pneumoniae NOT DETECTED NOT DETECTED   Proteus species NOT DETECTED NOT DETECTED   Serratia marcescens NOT DETECTED NOT DETECTED   Carbapenem resistance NOT DETECTED NOT DETECTED   Haemophilus influenzae NOT DETECTED NOT DETECTED   Neisseria meningitidis NOT DETECTED NOT DETECTED   Pseudomonas aeruginosa NOT DETECTED NOT DETECTED   Candida albicans NOT DETECTED NOT DETECTED   Candida glabrata NOT DETECTED NOT DETECTED   Candida krusei NOT DETECTED NOT DETECTED   Candida parapsilosis NOT DETECTED NOT DETECTED   Candida tropicalis NOT DETECTED NOT DETECTED    Name of physician (or Provider) Contacted:  N/A  Changes to prescribed antibiotics required:   Called for 1/2 BC from 9/15 growing GPR.  Likely contaminant and currently receiving Rocephin 2 g IV daily.  No changes needed.    Caryl Pina 05/02/2017  3:22 AM

## 2017-05-03 ENCOUNTER — Encounter (HOSPITAL_COMMUNITY)
Admission: EM | Disposition: A | Discharge status: Home / Self Care | Payer: Self-pay | Source: Home / Self Care | Attending: Critical Care Medicine

## 2017-05-03 ENCOUNTER — Inpatient Hospital Stay (HOSPITAL_COMMUNITY): Payer: Medicare Other | Admitting: Certified Registered"

## 2017-05-03 ENCOUNTER — Encounter (HOSPITAL_COMMUNITY): Payer: Self-pay | Admitting: *Deleted

## 2017-05-03 DIAGNOSIS — K921 Melena: Secondary | ICD-10-CM

## 2017-05-03 DIAGNOSIS — R0602 Shortness of breath: Secondary | ICD-10-CM

## 2017-05-03 DIAGNOSIS — J9601 Acute respiratory failure with hypoxia: Secondary | ICD-10-CM

## 2017-05-03 HISTORY — PX: COLONOSCOPY WITH PROPOFOL: SHX5780

## 2017-05-03 LAB — BASIC METABOLIC PANEL
ANION GAP: 4 — AB (ref 5–15)
BUN: 13 mg/dL (ref 6–20)
CHLORIDE: 108 mmol/L (ref 101–111)
CO2: 22 mmol/L (ref 22–32)
CREATININE: 1.03 mg/dL (ref 0.61–1.24)
Calcium: 8.2 mg/dL — ABNORMAL LOW (ref 8.9–10.3)
GFR calc non Af Amer: >60 mL/min (ref >60)
Glucose, Bld: 104 mg/dL — ABNORMAL HIGH (ref 65–99)
Potassium: 4.2 mmol/L (ref 3.5–5.1)
Sodium: 134 mmol/L — ABNORMAL LOW (ref 135–145)

## 2017-05-03 LAB — CULTURE, BLOOD (ROUTINE X 2)
CULTURE: NO GROWTH
SPECIAL REQUESTS: ADEQUATE

## 2017-05-03 LAB — CBC
HCT: 24.7 % — ABNORMAL LOW (ref 39.0–52.0)
HEMOGLOBIN: 8.4 g/dL — AB (ref 13.0–17.0)
MCH: 31.8 pg (ref 26.0–34.0)
MCHC: 34 g/dL (ref 30.0–36.0)
MCV: 93.6 fL (ref 78.0–100.0)
PLATELETS: 112 10*3/uL — AB (ref 150–400)
RBC: 2.64 MIL/uL — AB (ref 4.22–5.81)
RDW: 18.9 % — AB (ref 11.5–15.5)
WBC: 8.1 10*3/uL (ref 4.0–10.5)

## 2017-05-03 SURGERY — COLONOSCOPY WITH PROPOFOL
Anesthesia: Monitor Anesthesia Care

## 2017-05-03 MED ORDER — HYDROCORTISONE ACETATE 25 MG RE SUPP
25.0000 mg | Freq: Two times a day (BID) | RECTAL | Status: DC
  Administered 2017-05-03 – 2017-05-04 (×3): 25 mg via RECTAL
  Filled 2017-05-03 (×3): qty 1

## 2017-05-03 MED ORDER — PROPOFOL 10 MG/ML IV BOLUS
INTRAVENOUS | Status: DC | PRN
  Administered 2017-05-03 (×2): 10 mg via INTRAVENOUS

## 2017-05-03 MED ORDER — PANTOPRAZOLE SODIUM 40 MG PO TBEC
40.0000 mg | DELAYED_RELEASE_TABLET | Freq: Every day | ORAL | Status: DC
  Administered 2017-05-03 – 2017-05-04 (×2): 40 mg via ORAL
  Filled 2017-05-03 (×2): qty 1

## 2017-05-03 MED ORDER — PROPOFOL 500 MG/50ML IV EMUL
INTRAVENOUS | Status: DC | PRN
  Administered 2017-05-03: 50 ug/kg/min via INTRAVENOUS

## 2017-05-03 MED ORDER — FUROSEMIDE 10 MG/ML IJ SOLN
20.0000 mg | Freq: Two times a day (BID) | INTRAMUSCULAR | Status: DC
  Administered 2017-05-03 – 2017-05-04 (×3): 20 mg via INTRAVENOUS
  Filled 2017-05-03 (×3): qty 2

## 2017-05-03 MED ORDER — FUROSEMIDE 10 MG/ML IJ SOLN: 40.0000 mg | Freq: Two times a day (BID) | INTRAMUSCULAR | Status: DC

## 2017-05-03 MED ORDER — SODIUM CHLORIDE 0.9 % IV SOLN
INTRAVENOUS | Status: DC | PRN
  Administered 2017-05-03: 10:00:00 via INTRAVENOUS

## 2017-05-03 MED ORDER — LIDOCAINE HCL (CARDIAC) 20 MG/ML IV SOLN
INTRAVENOUS | Status: DC | PRN
  Administered 2017-05-03: 40 mg via INTRATRACHEAL

## 2017-05-03 SURGICAL SUPPLY — 21 items

## 2017-05-03 NOTE — Anesthesia Postprocedure Evaluation (Signed)
Anesthesia Post Note  Patient: [name]  Procedure(s) Performed: Procedure(s) (LRB): COLONOSCOPY WITH PROPOFOL (N/A)     Patient location during evaluation: Endoscopy Anesthesia Type: MAC Level of consciousness: awake and alert Pain management: pain level controlled Vital Signs Assessment: post-procedure vital signs reviewed and stable Respiratory status: spontaneous breathing, nonlabored ventilation, respiratory function stable and patient connected to nasal cannula oxygen Cardiovascular status: stable and blood pressure returned to baseline Postop Assessment: no apparent nausea or vomiting Anesthetic complications: no    Last Vitals:  Vitals:   05/03/17 1021 05/03/17 1035  BP: (!) 93/56 (!) 95/50  Pulse:  (!) 101  Resp: (!) 21 (!) 28  Temp: 37.1 C   SpO2: 97% 97%    Last Pain:  Vitals:   05/03/17 1021  TempSrc: Oral  PainSc:                  Laelyn Blumenthal,JAMES TERRILL

## 2017-05-03 NOTE — Brief Op Note (Signed)
04/28/2017 - 05/03/2017  10:32 AM  PATIENT:  [name]  61 y.o. male  PRE-OPERATIVE DIAGNOSIS:  Symptomatic anemia  POST-OPERATIVE DIAGNOSIS:  internal and external hemerroids  PROCEDURE:  Procedure(s): COLONOSCOPY WITH PROPOFOL (N/A)  SURGEON:  Surgeon(s) and Role:    * Zuri Lascala, MD - Primary  Findings/recommendations --------------------------------------- - Colonoscopy showed very large internal and external hemorrhoids, most likely source of patient's bleeding. - No evidence of bleeding in the terminal ileum or inside the colon. - Fair prep.( Patient only drank half prep) - Recommend repeat colonoscopy or a noninvasive method for screening for colon cancer in 3 years because of fair prep. - Okay to discharge from GI standpoint. - .Anusol suppository twice a day for 2 week   Discharge Planning Diet: Low sodium Discharge Medications: Protonix once a day, Anusol suppository twice a day for 2 week Follow up:  With me in 4 weeks.    [name] MD, [place] 05/03/2017, 10:37 AM  Pager [phone]  If no answer or after 5 PM call [phone]

## 2017-05-03 NOTE — Progress Notes (Signed)
[name] is a 61 y.o. male has presented with symptomatic anemia   The various methods of treatment have been discussed with the patient and family. After consideration of risks, benefits and other options for treatment, the patient has consented to  Procedure(s): Colonoscopy  as a surgical intervention .  The patient's history has been reviewed, patient examined, no change in status, stable for surgery.  I have reviewed the patient's chart and labs.  Questions were answered to the patient's satisfaction.   Labs reviewed. Hemoglobin stable. Platelets improving.    [name] MD, [place] 05/03/2017, 9:52 AM  Pager [phone]  If no answer or after 5 PM call [phone]

## 2017-05-03 NOTE — Consult Note (Addendum)
[facility] CM Primary Care Navigator  05/03/2017  [name] 05-25-1956 [id number]   Went to see patient at the bedside to identify possible discharge needs but RN reports that he is off the unit for a procedure (Colonoscopy) at this moment.  Will attempt to meet with patient at another time when available in the room.     Addendum:  Went back to see patientat the bedside to identify possible discharge needs and met with his lady friend Medical illustrator) as well. Patientreports having dizziness, weakness and unsteady gait, almost to fall thathad led to this admission.  Patient endorses Dr. Sonda Primes [facility] at [facility] as theprimary care provider.  [facility] [place] [facility]. Patient shares that hemanages his medications at home with friend's assistance using "pill box" system filled weekly.  Patient reportsthat he has been driving prior to admission but his lady friend will be providingtransportation to hisdoctors'appointments after discharge.  Friend reports that patient lives alone and independent with self care prior to admission but she will be staying with him at home to assist with his care needs as he recovers.  Anticipated discharge plan is home according to patient.  Patientand friend expressed understanding to call primary care provider's office when he returnshome for a post discharge follow-up appointment within a week or sooner if needed. Patient letter (with PCP's contact number) was provided as a reminder.  Explained to patientand friend about [facility] CM services available for health management at home but he deniesany current needs or concerns at this time. Although, he had opted and verbally agreed forEMMI calls tofollow-up with his recoveryat home.   Referral to [facility] calls made after discharge.  Patient and friend voiced  understanding to seek referral from primary care provider to [facility] care management if necessary and appropriate for services in the future.  [facility] care management information provided for future needs that he may have.   For questions, please contact:  Dannielle Huh, BSN, RN- [facility] Primary Care Navigator  Telephone: [phone] [place]

## 2017-05-03 NOTE — Op Note (Signed)
[facility] Patient Name: [name] Procedure Date : 05/03/2017 MRN: [id number] Attending MD: [name] , MD Date of Birth: [birth year] CSN: [id number] Age: 61 Admit Type: Inpatient Procedure:                Colonoscopy Indications:              Anemia Providers:                [name], MD, Elna Breslow, RN, William Dalton, Technician Referring MD:              Medicines:                Sedation Administered by an Anesthesia Professional Complications:            No immediate complications. Estimated Blood Loss:     Estimated blood loss: none. Procedure:                Pre-Anesthesia Assessment:                           - Prior to the procedure, a History and Physical                            was performed, and patient medications and                            allergies were reviewed. The patient's tolerance of                            previous anesthesia was also reviewed. The risks                            and benefits of the procedure and the sedation                            options and risks were discussed with the patient.                            All questions were answered, and informed consent                            was obtained. Prior Anticoagulants: The patient has                            taken no previous anticoagulant or antiplatelet                            agents. ASA Grade Assessment: III - A patient with                            severe systemic disease. After reviewing the risks  and benefits, the patient was deemed in                            satisfactory condition to undergo the procedure.                           After obtaining informed consent, the colonoscope                            was passed under direct vision. Throughout the                            procedure, the patient's blood pressure, pulse, and                            oxygen  saturations were monitored continuously. The                            EC-[id number] ([id number]) scope was introduced through                            the anus and advanced to the the terminal ileum,                            with identification of the appendiceal orifice and                            IC valve. The colonoscopy was technically difficult                            and complex due to inadequate bowel prep.                            Successful completion of the procedure was aided by                            lavage. The patient tolerated the procedure well.                            The quality of the bowel preparation was fair. The                            terminal ileum, ileocecal valve, appendiceal                            orifice, and rectum were photographed. The quality                            of the bowel preparation was fair. Scope In: 10:04:15 AM Scope Out: 10:17:09 AM Scope Withdrawal Time: 0 hours 7 minutes 56 seconds  Total Procedure Duration: 0 hours 12 minutes 54 seconds  Findings:      The perianal exam findings include non-thrombosed external hemorrhoids.      The digital rectal exam  was normal.      There was evidence of a prior end-to-end colo-colonic anastomosis in the       recto-sigmoid colon. This was patent and was characterized by healthy       appearing mucosa. The anastomosis was traversed.      The terminal ileum appeared normal.      A moderate amount of liquid stool was found in the sigmoid colon, in the       ascending colon and in the cecum, interfering with visualization. Lavage       of the area was performed, resulting in clearance with fair       visualization.      Internal hemorrhoids were found during retroflexion. The hemorrhoids       were large and Grade III (internal hemorrhoids that prolapse but require       manual reduction). Impression:               - Preparation of the colon was fair.                           -  Non-thrombosed external hemorrhoids found on                            perianal exam.                           - Patent end-to-end colo-colonic anastomosis,                            characterized by healthy appearing mucosa.                           - The examined portion of the ileum was normal.                           - Stool in the sigmoid colon, in the ascending                            colon and in the cecum.                           - Internal hemorrhoids.                           - No specimens collected. Moderate Sedation:      Moderate (conscious) sedation was personally administered by an       anesthesia professional. The following parameters were monitored: oxygen       saturation, heart rate, blood pressure, and response to care. Recommendation:           - Return patient to hospital ward for ongoing care.                           - Soft diet.                           - Continue present medications.                           -  Repeat colonoscopy( depending on patient's                            overall clinical condition) or non-invasive methods                            for colon cancer screening in 3 years because of                            fair prep                           - Return to GI office in 4 weeks. Procedure Code(s):        --- Professional ---                           [phone], Colonoscopy, flexible; diagnostic, including                            collection of specimen(s) by brushing or washing,                            when performed (separate procedure) Diagnosis Code(s):        --- Professional ---                           K64.2, Third degree hemorrhoids                           K64.4, Residual hemorrhoidal skin tags                           Z98.0, Intestinal bypass and anastomosis status                           D64.9, Anemia, unspecified CPT copyright 2016 [employer]. All rights reserved. The codes documented in  this report are preliminary and upon coder review may  be revised to meet current compliance requirements. [name], MD [name], MD 05/03/2017 10:32:06 AM Number of Addenda: 0

## 2017-05-03 NOTE — Anesthesia Preprocedure Evaluation (Signed)
Anesthesia Evaluation  Patient identified by MRN, date of birth, ID band Patient awake    Reviewed: Allergy & Precautions, NPO status   Airway Mallampati: II   Neck ROM: Full    Dental no notable dental hx.    Pulmonary shortness of breath, Current Smoker,    breath sounds clear to auscultation       Cardiovascular  Rhythm:Regular Rate:Tachycardia  Demand ischemia . Recent MI?   Neuro/Psych    GI/Hepatic (+)     substance abuse  alcohol use, Gi bleed   Endo/Other    Renal/GU      Musculoskeletal   Abdominal   Peds  Hematology  (+) anemia ,   Anesthesia Other Findings   Reproductive/Obstetrics                             Anesthesia Physical Anesthesia Plan  ASA: III  Anesthesia Plan: MAC   Post-op Pain Management:    Induction: Intravenous  PONV Risk Score and Plan: 0  Airway Management Planned: Natural Airway and Simple Face Mask  Additional Equipment:   Intra-op Plan:   Post-operative Plan: Extubation in OR  Informed Consent: I have reviewed the patients History and Physical, chart, labs and discussed the procedure including the risks, benefits and alternatives for the proposed anesthesia with the patient or authorized representative who has indicated his/her understanding and acceptance.     Plan Discussed with: CRNA  Anesthesia Plan Comments:         Anesthesia Quick Evaluation

## 2017-05-03 NOTE — Progress Notes (Signed)
PROGRESS NOTE  [name]  WIO:[id number] DOB: 1955/09/15 DOA: 04/28/2017 PCP: Josetta Huddle, MD  Brief Narrative:   The patient is a 61 year old male, with history of heavy alcohol use and smoking, bilateral hip replacements, bowel abscess with perforation status post colostomy and ileostomy with revision of both, who presented on 9/15 with dizziness and lightheadedness and dark stools. He data 45 pound weight loss in the last 2 months. His hemoglobin was 3.3 on the time of admission.  He was transfused 5 units PRBCs. He underwent EGD on 9/18 which demonstrated small esophageal varices without stigmata of bleeding and no evidence of gastric ulcer or gastric varices. He subsequently underwent colonoscopy on 9/20 which demonstrated large internal and external hemorrhoids which are likely the source of his bleeding. His course of been complicated by shortness of breath and bilateral pleural effusions on chest x-ray. Attempting some diuresis today.  SIGNIFICANT EVENTS  9/15   Admit with anemia, presyncope, concern for GI bleed.  PRBC 5 units  9/18 EGD > small esophageal varices with no stigmata of bleeding.  No gastric ulcer or varices. 9/20 colonoscopy >  STUDIES:  CT Abdomen/Pelvis 9/15 >> evidence of hepatic cirrhosis with splenomegaly and upper abdominal varices, liver enlarged but unchanged from prior studies, no evidence of bowel obstruction or abscess, rounded atelectasis in right lung base, centrilobular emphysema, aortoiliac atherosclerosis, multiple small nonobstructing renal calculi bilaterally Echo 9/17 > EF 55 - 60%, mod MR.  ANTIBIOTICS Vanco 9/15 x1 Zosyn 9/15 x1  Ceftriaxone 9/16 (empiric SBP) > 9/19  CULTURES BCx2 9/15 >>  UC 9/15 >> negative   Assessment & Plan:   Principal Problem:   Acute lower gastrointestinal hemorrhage Active Problems:   Symptomatic anemia   AKI (acute kidney injury) ([place])  Symptomatic anemia secondary to acute blood loss from internal and  external hemorrhoids -  Hemoglobin 8.4 g/dL and has remained stable for last few days -  Colonoscopy today -  Gastroenterology has signed off  Hepatic cirrhosis with varices secondary to alcoholism, meld score 28 on presentation -  Cirrhosis may be contributing to volume overload  Respiratory distress without hypoxia, likely due cirrhosis of the liver and volume resuscitation -  Start lasix 20mg  IV BID -  Repeat Creatinine in AM -  ECHO demonstrated preserved EF and no evidence of diastolic dysfunction  Thrombocytopenia due to cirrhosis and acute blood loss -  Platelets continue to trend up  NSTEMI/demand ischemia secondary to hypotension, severe anemia -  ECHO demonstrates no regional wall motion abnormalities and preserved EF -  No aspiration or plans for intervention at this time due to recent life-threatening bleeding  Tobacco Abuse - 1/2ppd currently Centrilobular Emphysema - noted on CT ABD -  Outpatient evaluation for COPD -  Nebulizer treatments are not helping wheezing  Hyponatremia - chronic; beer potomania and cirrhosis, mild and asymptomatic  AKI - likely related to ATN in setting of GIB, hypoperfusion, NSAID use.  creatinine trended down from 4.57 to 1.03 with IVF  Renal Cysts - noted on CT, no evidence of hydronephrosis   Hypokalemia, Hypomagnesemia, and Hypophosphatemia, replaced -  Repeat BMP with magnesium and phos in AM  Alcohol abuse -  No evidence of alcohol withdrawal  DVT prophylaxis:  SCDs Code Status:  Full code Family Communication:  Patient and his wife Disposition Plan:  Colonoscopy today and then diuresis to help with wheezing.  Likely discharge tomorrow    Consultants:   [facility]  Procedures:  9/18 EGD 9/20  Colonoscopy  Antimicrobials:  Anti-infectives    Start     Dose/Rate Route Frequency Ordered Stop   04/30/17 2100  vancomycin (VANCOCIN) IVPB 1000 mg/200 mL premix  Status:  Discontinued     1,000 mg 200 mL/hr over  60 Minutes Intravenous Every 48 hours 04/28/17 2008 04/29/17 0811   04/29/17 0815  cefTRIAXone (ROCEPHIN) 2 g in dextrose 5 % 50 mL IVPB  Status:  Discontinued     2 g 100 mL/hr over 30 Minutes Intravenous Every 24 hours 04/29/17 0811 05/02/17 1017   04/29/17 0300  piperacillin-tazobactam (ZOSYN) IVPB 2.25 g  Status:  Discontinued     2.25 g 100 mL/hr over 30 Minutes Intravenous Every 6 hours 04/28/17 2008 04/29/17 0811   04/28/17 1915  piperacillin-tazobactam (ZOSYN) IVPB 3.375 g     3.375 g 100 mL/hr over 30 Minutes Intravenous  Once 04/28/17 1912 04/28/17 2052   04/28/17 1915  vancomycin (VANCOCIN) IVPB 1000 mg/200 mL premix     1,000 mg 200 mL/hr over 60 Minutes Intravenous  Once 04/28/17 1912 04/28/17 2123       Subjective:  Feels a little SOB/wheezy.  Nebulizer treatments are not helping much.  Pooping yellowish-clear after bowel prep last night.  No further bleeding.    Objective: Vitals:   05/03/17 0431 05/03/17 0859 05/03/17 1021 05/03/17 1035  BP: 121/70 (!) 109/47 (!) 93/56 (!) 95/50  Pulse:  (!) 107  (!) 101  Resp: (!) 35 (!) 29 (!) 21 (!) 28  Temp: 98.6 F (37 C) 98.9 F (37.2 C) 98.7 F (37.1 C)   TempSrc: Oral Oral Oral   SpO2:  99% 97% 97%  Weight: 66.4 kg (146 lb 6.2 oz)     Height:        Intake/Output Summary (Last 24 hours) at 05/03/17 1240 Last data filed at 05/03/17 1126  Gross per 24 hour  Intake           256.67 ml  Output              525 ml  Net          -268.33 ml   Filed Weights   04/30/17 0500 05/01/17 0500 05/03/17 0431  Weight: 65.4 kg (144 lb 2.9 oz) 65.1 kg (143 lb 8.3 oz) 66.4 kg (146 lb 6.2 oz)    Examination:  General exam:  Adult male with temporal wasting.  No acute distress.  HEENT:  NCAT, MMM Respiratory system: Diminished bilateral breath sounds with high-pitched expiratory wheeze, no focal rales or rhonchi  Cardiovascular system: Regular rate and rhythm, normal S1/S2. No murmurs, rubs, gallops or clicks.  Warm  extremities Gastrointestinal system: Hyperactive active bowel sounds, soft, nondistended, nontender. MSK:  Normal tone and bulk, no lower extremity edema Neuro:  Grossly intact    Data Reviewed: I have personally reviewed following labs and imaging studies  CBC:  Recent Labs Lab 04/28/17 1910 04/29/17 0649  04/30/17 1843 05/01/17 1129 05/01/17 1825 05/02/17 0738 05/03/17 0423  WBC 8.1 7.9  < > 7.1 5.6 6.5 7.6 8.1  NEUTROABS 4.7 4.7  --   --   --   --   --   --   HGB 3.3* 7.4*  < > 8.4* 8.1* 8.3* 7.9* 8.4*  HCT 9.2* 20.9*  < > 24.7* 24.1* 24.5* 23.9* 24.7*  MCV 89.3 87.1  < > 90.1 92.0 92.8 93.0 93.6  PLT 39* 32*  < > 70* 68* 88* 91* 112*  < > = values  in this interval not displayed. Basic Metabolic Panel:  Recent Labs Lab 04/29/17 1038 04/30/17 0216 05/01/17 0248 05/02/17 0330 05/03/17 0423  NA 133* 132* 130* 134* 134*  K 4.5 4.3 4.1 3.4* 4.2  CL 108 109 105 108 108  CO2 20* 20* 21* 22 22  GLUCOSE 119* 110* 111* 126* 104*  BUN 43* 39* 36* 26* 13  CREATININE 3.84* 3.29* 2.19* 1.42* 1.03  CALCIUM 10.2 9.6 8.8* 8.4* 8.2*  MG  --   --   --  1.1*  --   PHOS  --   --   --  2.0*  --    GFR: Estimated Creatinine Clearance: 68.9 mL/min (by C-G formula based on SCr of 1.03 mg/dL). Liver Function Tests:  Recent Labs Lab 04/28/17 1910 05/01/17 0248  AST 100* 56*  ALT 53 39  ALKPHOS 122 81  BILITOT 2.1* 3.2*  PROT 6.8 6.0*  ALBUMIN 2.9* 2.4*    Recent Labs Lab 04/28/17 1910  LIPASE 122*   No results for input(s): AMMONIA in the last 168 hours. Coagulation Profile:  Recent Labs Lab 04/29/17 0146 05/01/17 0248  INR 1.56 1.58   Cardiac Enzymes:  Recent Labs Lab 04/29/17 1127 04/30/17 0932 04/30/17 1417 04/30/17 2053  TROPONINI 4.44* 4.11* 2.57* 2.92*   BNP (last 3 results) No results for input(s): PROBNP in the last 8760 hours. HbA1C: No results for input(s): HGBA1C in the last 72 hours. CBG:  Recent Labs Lab 04/28/17 1954  GLUCAP 129*    Lipid Profile: No results for input(s): CHOL, HDL, LDLCALC, TRIG, CHOLHDL, LDLDIRECT in the last 72 hours. Thyroid Function Tests: No results for input(s): TSH, T4TOTAL, FREET4, T3FREE, THYROIDAB in the last 72 hours. Anemia Panel: No results for input(s): VITAMINB12, FOLATE, FERRITIN, TIBC, IRON, RETICCTPCT in the last 72 hours. Urine analysis:    Component Value Date/Time   COLORURINE YELLOW 04/28/2017 0840   APPEARANCEUR CLEAR 04/28/2017 0840   APPEARANCEUR Cloudy 06/12/2012 1454   LABSPEC 1.016 04/28/2017 0840   LABSPEC 1.021 06/12/2012 1454   PHURINE 5.0 04/28/2017 0840   GLUCOSEU NEGATIVE 04/28/2017 0840   GLUCOSEU Negative 06/12/2012 1454   HGBUR LARGE (A) 04/28/2017 0840   BILIRUBINUR NEGATIVE 04/28/2017 0840   BILIRUBINUR Negative 06/12/2012 1454   KETONESUR NEGATIVE 04/28/2017 0840   PROTEINUR 30 (A) 04/28/2017 0840   NITRITE NEGATIVE 04/28/2017 0840   LEUKOCYTESUR TRACE (A) 04/28/2017 0840   LEUKOCYTESUR 1+ 06/12/2012 1454   Sepsis Labs: @LABRCNTIP (procalcitonin:4,lacticidven:4)  ) Recent Results (from the past 240 hour(s))  Culture, blood (routine x 2)     Status: None (Preliminary result)   Collection Time: 04/28/17  7:35 PM  Result Value Ref Range Status   Specimen Description BLOOD RIGHT ANTECUBITAL  Final   Special Requests   Final    BOTTLES DRAWN AEROBIC AND ANAEROBIC Blood Culture adequate volume   Culture NO GROWTH 4 DAYS  Final   Report Status PENDING  Incomplete  Culture, blood (routine x 2)     Status: None (Preliminary result)   Collection Time: 04/28/17  7:55 PM  Result Value Ref Range Status   Specimen Description BLOOD LEFT ANTECUBITAL  Final   Special Requests   Final    BOTTLES DRAWN AEROBIC AND ANAEROBIC Blood Culture adequate volume   Culture  Setup Time   Final    GRAM POSITIVE RODS AEROBIC BOTTLE ONLY CRITICAL RESULT CALLED TO, READ BACK BY AND VERIFIED WITH: G.ABBOTT PHARMD 05/02/17 0140 L.CHAMPION    Culture GRAM POSITIVE RODS  Final   Report Status PENDING  Incomplete  Blood Culture ID Panel (Reflexed)     Status: None   Collection Time: 04/28/17  7:55 PM  Result Value Ref Range Status   Enterococcus species NOT DETECTED NOT DETECTED Final   Vancomycin resistance NOT DETECTED NOT DETECTED Final   Listeria monocytogenes NOT DETECTED NOT DETECTED Final   Staphylococcus species NOT DETECTED NOT DETECTED Final   Staphylococcus aureus NOT DETECTED NOT DETECTED Final   Methicillin resistance NOT DETECTED NOT DETECTED Final   Streptococcus species NOT DETECTED NOT DETECTED Final   Streptococcus agalactiae NOT DETECTED NOT DETECTED Final   Streptococcus pneumoniae NOT DETECTED NOT DETECTED Final   Streptococcus pyogenes NOT DETECTED NOT DETECTED Final   Acinetobacter baumannii NOT DETECTED NOT DETECTED Final   Enterobacteriaceae species NOT DETECTED NOT DETECTED Final   Enterobacter cloacae complex NOT DETECTED NOT DETECTED Final   Escherichia coli NOT DETECTED NOT DETECTED Final   Klebsiella oxytoca NOT DETECTED NOT DETECTED Final   Klebsiella pneumoniae NOT DETECTED NOT DETECTED Final   Proteus species NOT DETECTED NOT DETECTED Final   Serratia marcescens NOT DETECTED NOT DETECTED Final   Carbapenem resistance NOT DETECTED NOT DETECTED Final   Haemophilus influenzae NOT DETECTED NOT DETECTED Final   Neisseria meningitidis NOT DETECTED NOT DETECTED Final   Pseudomonas aeruginosa NOT DETECTED NOT DETECTED Final   Candida albicans NOT DETECTED NOT DETECTED Final   Candida glabrata NOT DETECTED NOT DETECTED Final   Candida krusei NOT DETECTED NOT DETECTED Final   Candida parapsilosis NOT DETECTED NOT DETECTED Final   Candida tropicalis NOT DETECTED NOT DETECTED Final  MRSA PCR Screening     Status: None   Collection Time: 04/29/17  1:16 AM  Result Value Ref Range Status   MRSA by PCR NEGATIVE NEGATIVE Final    Comment:        The GeneXpert MRSA Assay (FDA approved for NASAL specimens only), is one component of  a comprehensive MRSA colonization surveillance program. It is not intended to diagnose MRSA infection nor to guide or monitor treatment for MRSA infections.   Urine culture     Status: None   Collection Time: 04/29/17  8:40 AM  Result Value Ref Range Status   Specimen Description URINE, CLEAN CATCH  Final   Special Requests NONE  Final   Culture NO GROWTH  Final   Report Status 04/30/2017 FINAL  Final      Radiology Studies: No results found.   Scheduled Meds: . feeding supplement  1 Container Oral TID BM  . furosemide  20 mg Intravenous BID  . hydrocortisone  25 mg Rectal BID  . Influenza vac split quadrivalent PF  0.5 mL Intramuscular Tomorrow-1000  . ipratropium-albuterol  3 mL Nebulization TID  . mouth rinse  15 mL Mouth Rinse BID  . pantoprazole  40 mg Oral Daily   Continuous Infusions:   LOS: 5 days    Time spent: 30 min    Janece Canterbury, MD [facility] Pager [phone]  If 7PM-7AM, please contact night-coverage [email or web address] Password [email or web address] 05/03/2017, 12:40 PM

## 2017-05-03 NOTE — Anesthesia Preprocedure Evaluation (Signed)
Anesthesia Evaluation  Patient identified by MRN, date of birth, ID band Patient awake    Reviewed: Allergy & Precautions, NPO status , Patient's Chart, lab work & pertinent test results  Airway        Dental   Pulmonary Current Smoker,    breath sounds clear to auscultation       Cardiovascular  Rhythm:Regular Rate:Normal     Neuro/Psych    GI/Hepatic GERD  ,  Endo/Other    Renal/GU Renal InsufficiencyRenal disease     Musculoskeletal  (+) Arthritis ,   Abdominal   Peds  Hematology  (+) anemia ,   Anesthesia Other Findings   Reproductive/Obstetrics                             Anesthesia Physical Anesthesia Plan  ASA: II  Anesthesia Plan: MAC   Post-op Pain Management:    Induction: Intravenous  PONV Risk Score and Plan: 0  Airway Management Planned: Simple Face Mask and Natural Airway  Additional Equipment:   Intra-op Plan:   Post-operative Plan:   Informed Consent: I have reviewed the patients History and Physical, chart, labs and discussed the procedure including the risks, benefits and alternatives for the proposed anesthesia with the patient or authorized representative who has indicated his/her understanding and acceptance.   Dental advisory given  Plan Discussed with: CRNA  Anesthesia Plan Comments:         Anesthesia Quick Evaluation

## 2017-05-03 NOTE — Transfer of Care (Signed)
Immediate Anesthesia Transfer of Care Note  Patient: [name]  Procedure(s) Performed: Procedure(s): COLONOSCOPY WITH PROPOFOL (N/A)  Patient Location: Endoscopy Unit  Anesthesia Type:MAC  Level of Consciousness: awake, alert  and oriented  Airway & Oxygen Therapy: Patient connected to nasal cannula oxygen  Post-op Assessment: Post -op Vital signs reviewed and stable  Post vital signs: stable  Last Vitals:  Vitals:   05/03/17 0431 05/03/17 0859  BP: 121/70 (!) 109/47  Pulse:  (!) 107  Resp: (!) 35 (!) 29  Temp: 37 C 37.2 C  SpO2:  99%    Last Pain:  Vitals:   05/03/17 0859  TempSrc: Oral  PainSc:          Complications: No apparent anesthesia complications

## 2017-05-04 DIAGNOSIS — R531 Weakness: Secondary | ICD-10-CM

## 2017-05-04 DIAGNOSIS — D649 Anemia, unspecified: Secondary | ICD-10-CM

## 2017-05-04 LAB — CULTURE, BLOOD (ROUTINE X 2): Special Requests: ADEQUATE

## 2017-05-04 LAB — BASIC METABOLIC PANEL
Anion gap: 6 (ref 5–15)
BUN: 13 mg/dL (ref 6–20)
CHLORIDE: 109 mmol/L (ref 101–111)
CO2: 20 mmol/L — AB (ref 22–32)
CREATININE: 0.95 mg/dL (ref 0.61–1.24)
Calcium: 8.1 mg/dL — ABNORMAL LOW (ref 8.9–10.3)
GFR calc non Af Amer: >60 mL/min (ref >60)
GLUCOSE: 122 mg/dL — AB (ref 65–99)
Potassium: 3.4 mmol/L — ABNORMAL LOW (ref 3.5–5.1)
Sodium: 135 mmol/L (ref 135–145)

## 2017-05-04 MED ORDER — PANTOPRAZOLE SODIUM 40 MG PO TBEC: 40.0000 mg | DELAYED_RELEASE_TABLET | Freq: Every day | ORAL | 0 refills | Status: DC

## 2017-05-04 MED ORDER — SPIRONOLACTONE 100 MG PO TABS: 100.0000 mg | ORAL_TABLET | Freq: Every day | ORAL | 0 refills | Status: DC

## 2017-05-04 MED ORDER — FUROSEMIDE 40 MG PO TABS: 40.0000 mg | ORAL_TABLET | Freq: Every day | ORAL | 0 refills | Status: DC

## 2017-05-04 MED ORDER — HYDROCORTISONE ACETATE 25 MG RE SUPP: 25.0000 mg | Freq: Two times a day (BID) | RECTAL | 0 refills | Status: DC

## 2017-05-04 NOTE — Progress Notes (Signed)
Discharge instructions given to patient and girlfriend.  Patient verbalized understanding of discharge instructions.  Patient PIV discharged.  Area CDI.    Discharge vitals Vitals:   05/04/17 1315 05/04/17 1345  BP:  (!) 102/42  Pulse:  (!) 117  Resp:  20  Temp:  98.5 F (36.9 C)  SpO2: 96% 97%   Discharge medication Allergies as of 05/04/2017      Reactions   Sulfa Antibiotics Other (See Comments)   CAUSED BLISTERS IN THE MOUTH      Medication List    STOP taking these medications   HYDROcodone-acetaminophen 5-325 MG tablet Commonly known as:  NORCO/VICODIN   ibuprofen 200 MG tablet Commonly known as:  ADVIL,MOTRIN   oxyCODONE 5 MG immediate release tablet Commonly known as:  Oxy IR/ROXICODONE     TAKE these medications   acetaminophen 325 MG tablet Commonly known as:  TYLENOL Take 325-650 mg by mouth every 6 (six) hours as needed (for pain or headaches).   feeding supplement Liqd Take 1 Container by mouth 2 (two) times a week. Reported on 07/28/2015   furosemide 40 MG tablet Commonly known as:  LASIX Take 1 tablet (40 mg total) by mouth daily.   hydrocortisone 25 MG suppository Commonly known as:  ANUSOL-HC Place 1 suppository (25 mg total) rectally 2 (two) times daily.   methocarbamol 500 MG tablet Commonly known as:  ROBAXIN Take 500 mg by mouth 3 (three) times daily as needed for muscle spasms.   pantoprazole 40 MG tablet Commonly known as:  PROTONIX Take 1 tablet (40 mg total) by mouth daily.   spironolactone 100 MG tablet Commonly known as:  ALDACTONE Take 1 tablet (100 mg total) by mouth daily.            Discharge Care Instructions        Start     Ordered   05/05/17 0000  pantoprazole (PROTONIX) 40 MG tablet  Daily     05/04/17 1353   05/04/17 0000  hydrocortisone (ANUSOL-HC) 25 MG suppository  2 times daily     05/04/17 1353   05/04/17 0000  spironolactone (ALDACTONE) 100 MG tablet  Daily     05/04/17 1353   05/04/17 0000   furosemide (LASIX) 40 MG tablet  Daily     05/04/17 1353   05/04/17 0000  Increase activity slowly     05/04/17 1353   05/04/17 0000  Diet - low sodium heart healthy     05/04/17 1353   05/04/17 0000  Call MD for:  difficulty breathing, headache or visual disturbances     05/04/17 1353   05/04/17 0000  Call MD for:  persistant dizziness or light-headedness     05/04/17 1353   05/04/17 0000  Call MD for:  extreme fatigue     05/04/17 1353   05/04/17 0000  Call MD for:  temperature >100.4     05/04/17 1353     Patient getting dressed and will be escorted to main entrance.

## 2017-05-04 NOTE — Progress Notes (Addendum)
Report call to R.N. On [address] [place] and Patient transfer via bed and belongings on tele. Left message for Friend to call me back to let her know he was being transfer to another room

## 2017-05-04 NOTE — Progress Notes (Addendum)
Was just inform there was a bed avail. On [place]. Attempted to call report R.N. Unable to take at this time and will call me back in 5 mins.

## 2017-05-04 NOTE — Discharge Instructions (Signed)
Nutrition Post Hospital Stay °Proper nutrition can help your body recover from illness and injury.   °Foods and beverages high in protein, vitamins, and minerals help rebuild muscle loss, promote healing, & reduce fall risk.  ° °•In addition to eating healthy foods, a nutrition shake is an easy, delicious way to get the nutrition you need during and after your hospital stay ° °It is recommended that you continue to drink 2 bottles per day of:       Ensure or Boost for at least 1 month (30 days) after your hospital stay  ° °Tips for adding a nutrition shake into your routine: °As allowed, drink one with vitamins or medications instead of water or juice °Enjoy one as a tasty mid-morning or afternoon snack °Drink cold or make a milkshake out of it °Drink one instead of milk with cereal or snacks °Use as a coffee creamer °  °Available at the following grocery stores and pharmacies:           °* [employer] * [employer] * [employer]  °* [employer]          * [employer] * [employer]  °* [facility]      * Target  * [employer]   °* [facility]  * [employer]   °* [facility] [phone]  °          °For COUPONS visit: www.ensure.com/join or www.[email or web address]/members/sign-up  ° °Suggested Substitutions °Ensure Plus = Boost Plus = Carnation Breakfast Essentials = Boost Compact °Ensure Active Clear = Boost Breeze °Glucerna Shake = Boost Glucose Control = Carnation Breakfast Essentials SUGAR FREE ° °  °

## 2017-05-04 NOTE — Discharge Summary (Addendum)
Physician Discharge Summary  [name] WOE:[id number] DOB: 1956/01/03 DOA: 04/28/2017  PCP: Josetta Huddle, MD  Admit date: 04/28/2017 Discharge date: 05/04/2017  Admitted From: Home  Disposition:  Home  Recommendations for Outpatient Follow-up:  1. Follow up with PCP early next week regarding GI bleed and dyspnea 2. Please obtain BMP/CBC at next appointment to follow-up anemia and potassium and creatinine after starting diuretics 3. Follow-up with [facility] gastroenterology Dr. Alessandra Bevels in one month 4. Consider referral for pulmonary function tests if not already complete 5. Referral to cardiology for CAD work up  [place]:  None  Equipment/Devices:  None   Discharge Condition:  Stable, improved CODE STATUS:  Full code  Diet recommendation:  Regular  Brief/Interim Summary:  The patient is a 61 year old male, with history of heavy alcohol use and smoking, bilateral hip replacements, bowel abscess with perforation status post colostomy and ileostomy with revision of both, who presented on 9/15 with dizziness and lightheadedness and dark stools. He data 45 pound weight loss in the last 2 months. His hemoglobin was 3.3 on the time of admission.  He was transfused 5 units PRBCs. He underwent EGD on 9/18 which demonstrated small esophageal varices without stigmata of bleeding and no evidence of gastric ulcer or gastric varices. He subsequently underwent colonoscopy on 9/20 which demonstrated large internal and external hemorrhoids which are likely the source of his bleeding. His course was complicated by shortness of breath and bilateral pleural effusions on chest x-ray, likely secondary to cirrhosis with aggressive volume resuscitation.  He is diuretic naive and cirrhotic and therefore at increased risk of AKI, so he was diuresed with lasix 20mg  IV BID overnight with minimal to no improvement in his wheezing/dyspnea.  He was, however, able to ambulate in the hall maintaining O2 sat > 90% and  was requesting discharge.  He has been given prescriptions for aldactone/lasix and was advised to call to get a PCP appointment early next week for blood work and evaluation of his anemia and SOB.    SIGNIFICANT EVENTS  9/15 Admit with anemia, presyncope, concern for GI bleed. PRBC 5 units 9/18 EGD >small esophageal varices with no stigmata of bleeding. No gastric ulcer or varices. 9/20 colonoscopy >  STUDIES:  CT Abdomen/Pelvis 9/15 >> evidence of hepatic cirrhosis with splenomegaly and upper abdominal varices, liver enlarged but unchanged from prior studies, no evidence of bowel obstruction or abscess, rounded atelectasis in right lung base, centrilobular emphysema, aortoiliac atherosclerosis, multiple small nonobstructing renal calculi bilaterally Echo 9/17 > EF 55 - 60%, mod MR.  ANTIBIOTICS Vanco 9/15 x1 Zosyn 9/15 x1  Ceftriaxone 9/16 (empiric SBP) >9/19  CULTURES BCx2 9/15 >> UC 9/15 >>negative   Discharge Diagnoses:  Principal Problem:   Acute lower gastrointestinal hemorrhage Active Problems:   Symptomatic anemia   AKI (acute kidney injury) ([place])  Symptomatic anemia secondary to acute blood loss from internal and external hemorrhoids -  Hemoglobin 8.4 g/dL and has remained stable for last few days -  Start anusol x 2 weeks -  Protonix Rx given -  Gastroenterology will follow up in 4 weeks  Hepatic cirrhosis with varices secondary to alcoholism, meld score 28 on presentation -  Cirrhosis may be contributing to volume overload  Respiratory distress without hypoxia, likely due cirrhosis of the liver and volume resuscitation -  ECHO demonstrated preserved EF and no evidence of diastolic dysfunction -  Started Lasix 40 mg once daily and spironolactone 100 mg once daily -  Repeat BMP early next  week  Thrombocytopenia due to cirrhosis and acute blood loss -  Platelets continued to trend up  NSTEMI/demand ischemia secondary to hypotension, severe anemia -   ECHO demonstrates no regional wall motion abnormalities and preserved EF -  No aspiration or plans for intervention at this time due to recent life-threatening bleeding -  Referral to cardiology as outpatient  Tobacco Abuse - 1/2ppd currently Centrilobular Emphysema - noted on CT ABD -  Outpatient evaluation for COPD -  Nebulizer treatments are not helping wheezing  Hyponatremia - chronic; beer potomania and cirrhosis, mild and asymptomatic  AKI - likely related to ATN in setting of GIB, hypoperfusion, NSAID use. Creatinine trended down from 4.57 to 0.95  Renal Cysts - noted on CT, no evidence of hydronephrosis  Hypokalemia, Hypomagnesemia, and Hypophosphatemia, replaced  Alcohol abuse -  No evidence of alcohol withdrawal   Discharge Instructions  Discharge Instructions    Call MD for:  difficulty breathing, headache or visual disturbances    Complete by:  As directed    Call MD for:  extreme fatigue    Complete by:  As directed    Call MD for:  persistant dizziness or light-headedness    Complete by:  As directed    Call MD for:  temperature >100.4    Complete by:  As directed    Diet - low sodium heart healthy    Complete by:  As directed    Increase activity slowly    Complete by:  As directed        Medication List    STOP taking these medications   HYDROcodone-acetaminophen 5-325 MG tablet Commonly known as:  NORCO/VICODIN   ibuprofen 200 MG tablet Commonly known as:  ADVIL,MOTRIN   oxyCODONE 5 MG immediate release tablet Commonly known as:  Oxy IR/ROXICODONE     TAKE these medications   acetaminophen 325 MG tablet Commonly known as:  TYLENOL Take 325-650 mg by mouth every 6 (six) hours as needed (for pain or headaches).   feeding supplement Liqd Take 1 Container by mouth 2 (two) times a week. Reported on 07/28/2015   furosemide 40 MG tablet Commonly known as:  LASIX Take 1 tablet (40 mg total) by mouth daily.   hydrocortisone 25 MG  suppository Commonly known as:  ANUSOL-HC Place 1 suppository (25 mg total) rectally 2 (two) times daily.   methocarbamol 500 MG tablet Commonly known as:  ROBAXIN Take 500 mg by mouth 3 (three) times daily as needed for muscle spasms.   pantoprazole 40 MG tablet Commonly known as:  PROTONIX Take 1 tablet (40 mg total) by mouth daily.   spironolactone 100 MG tablet Commonly known as:  ALDACTONE Take 1 tablet (100 mg total) by mouth daily.      Follow-up Information    Brahmbhatt, Parag, MD. Schedule an appointment as soon as possible for a visit in 4 week(s).   Specialty:  Gastroenterology Contact information: [place] [address] [phone]        Josetta Huddle, MD. Schedule an appointment as soon as possible for a visit in 3 week(s).   Specialty:  Internal Medicine Contact information: [address] [place] [address] [phone]          Allergies  Allergen Reactions  . Sulfa Antibiotics Other (See Comments)    CAUSED BLISTERS IN THE MOUTH    Consultations: [facility], Dr. Alessandra Bevels   Procedures/Studies: Ct Abdomen Pelvis Wo Contrast  Result  Date: 04/28/2017 CLINICAL DATA:  Weight loss. Intermittent melena. Intermittent nausea and vomiting EXAM: CT ABDOMEN AND PELVIS WITHOUT CONTRAST TECHNIQUE: Multidetector CT imaging of the abdomen and pelvis was performed following the standard protocol without IV contrast. COMPARISON:  CT abdomen and pelvis November 14, 2013; abdominal MRI May 26, 2016 FINDINGS: Lower chest: There is centrilobular emphysematous change in lung bases. There is a bulla in the right middle lobe laterally. There is an area of probable rounded atelectasis in the medial right base posteriorly. Hepatobiliary: Liver measures 20.3 cm in length. The left lobe of the liver is markedly atrophic. The caudate lobe is prominent. No focal liver lesions are appreciable on this noncontrast enhanced  study. Gallbladder wall is not appreciably thickened. There is no biliary duct dilatation. Pancreas: There is no pancreatic mass or inflammatory focus. Spleen: Spleen measures 15.8 x 13.3 x 6.6 cm with a measured splenic volume of 693 cubic cm. No focal splenic lesions are evident on this noncontrast enhanced study. Adrenals/Urinary Tract: Adrenals bilaterally appear unremarkable. There are multiple cysts arising from the right kidney, a finding present on previous studies. Largest cyst on the right measures 3.8 x 3.8 cm. There are cystic areas in the left kidney. There is a probable cyst which contains debris arising from the posterior mid left kidney measuring 2.2 x 1.4 cm. This lesion is stable in appearance compared to prior study from 2015. There is no hydronephrosis on either side. There are scattered 1 mm calculi in right kidney. There is a 4 x 4 mm calculus with an adjacent 3 x 2 mm calculus in the upper to mid right kidney. There are scattered 1-2 mm calculi throughout the left kidney. There is no ureteral calculus on either side. Urinary bladder is midline. No urinary bladder wall lesion is evident. Note that evaluation urinary bladder is limited due to artifact from total hip replacements bilaterally. Stomach/Bowel: Rectum is borderline distended with air and stool. Rectal wall does not appear thickened. Artifact from total hip replacements makes evaluation of the rectal region less than optimal. There is no appreciable bowel wall or mesenteric thickening. No evident bowel obstruction. No free air or portal venous air. Vascular/Lymphatic: There are varices in the upper abdomen. There is atherosclerotic calcification aorta and common iliac arteries. There is evidence of moderately severe obstruction in the proximal iliac arteries bilaterally due to calcified plaque. Major mesenteric vessels appear patent on this noncontrast enhanced study, although there is moderate calcification in the major mesenteric  vessels proximally. There are scattered subcentimeter retroperitoneal lymph nodes. There are small lymph nodes in the periportal region, likely due to underlying cirrhosis. There is no frank adenopathy by size criteria in the abdomen or pelvis. Reproductive: Prostate and seminal vesicles appear grossly normal in size and contour. Artifact from total hip replacements makes evaluation of this area less than optimal. Other: There are surgical clips in the periappendiceal region. There is no periappendiceal region inflammation. There are areas of postoperative change in the pelvis. No hernia is currently seen. No abscess or ascites is evident in the abdomen or pelvis. Musculoskeletal: Total hip replacements noted bilaterally. There are no blastic or lytic bone lesions. There is degenerative change in the lumbar spine. There is no intramuscular or abdominal wall lesion. IMPRESSION: 1. Evidence of hepatic cirrhosis with splenomegaly and upper abdominal varices. Liver is enlarged but unchanged from prior studies. No focal liver lesions are evident on this noncontrast enhanced study. 2. No evident bowel obstruction. No abscess. No periappendiceal region inflammation.  3. Subcentimeter abdominal lymph nodes, most likely secondary to underlying cirrhosis. 4. Probable rounded atelectasis posterior right lung base. Underlying centrilobular emphysematous change. 5. Aortoiliac atherosclerosis. There appears to be hemodynamically significant obstructive disease in both proximal common iliac arteries due to extensive calcified plaque. 6. Multiple small nonobstructing renal calculi bilaterally. No hydronephrosis or ureteral calculus on either side. 7. Multiple renal cysts, larger on the right than on the left. There is a stable cyst which contains debris in the posterior mid left kidney. Stability since 2015 is felt to be indicative of benign etiology. 8.  Status post total hip replacements bilaterally. 9. Given history of melena,  colonoscopy is advised after bowel preparation if direct colonic visualization is not been performed recently. Note that there are areas suggesting previous bowel surgery with patent anastomoses. Aortic Atherosclerosis (ICD10-I70.0) and Emphysema (ICD10-J43.9). Electronically Signed   By: Lowella Grip III M.D.   On: 04/28/2017 21:41   Dg Chest Port 1 View  Result Date: 05/01/2017 CLINICAL DATA:  Shortness of breath. EXAM: PORTABLE CHEST 1 VIEW COMPARISON:  04/28/2017. FINDINGS: Cardiomegaly. Diffuse bilateral pulmonary infiltrates consistent with pulmonary edema noted. Bilateral pleural effusions. These findings are new from prior exam. No pneumothorax . IMPRESSION: Congestive heart failure with bilateral pulmonary edema and bilateral pleural effusions. Electronically Signed   By: Marcello Moores  Register   On: 05/01/2017 07:56   Dg Chest Port 1 View  Result Date: 04/28/2017 CLINICAL DATA:  Sepsis and fever. EXAM: PORTABLE CHEST 1 VIEW COMPARISON:  05/03/2016 chest CT 03/10/2014 chest radiograph FINDINGS: Upper limits normal heart size noted. Mild interstitial opacities in the lower lungs noted. No evidence of pneumothorax, pleural effusion, airspace disease or acute bony abnormality. IMPRESSION: Mild lower lung interstitial opacities which may represent mild interstitial edema. No evidence of focal pneumonia. Electronically Signed   By: Margarette Canada M.D.   On: 04/28/2017 19:38    Subjective: Still feeling somewhat [name] of breath today. He has been coughing and wheezing. He feels [name] of breath when he gets up to walk to the bathroom and back. He would really like to go home today. He denies any obvious bleeding.  Discharge Exam: Vitals:   05/04/17 1315 05/04/17 1345  BP:  (!) 102/42  Pulse:  (!) 117  Resp:  20  Temp:  98.5 F (36.9 C)  SpO2: 96% 97%   Vitals:   05/04/17 0820 05/04/17 1313 05/04/17 1315 05/04/17 1345  BP:    (!) 102/42  Pulse:  (!) 106  (!) 117  Resp:  20  20  Temp:     98.5 F (36.9 C)  TempSrc:    Oral  SpO2: 90% 96% 96% 97%  Weight:      Height:        General: Thin male, belly breathing, mildly tachypneic Cardiovascular: RRR, S1/S2 +, no rubs, no gallops Respiratory: Wheezing throughout, no focal rales or rhonchi Abdominal: Soft, NT, mildly distended, bowel sounds + Extremities: no edema, no cyanosis    The results of significant diagnostics from this hospitalization (including imaging, microbiology, ancillary and laboratory) are listed below for reference.     Microbiology: Recent Results (from the past 240 hour(s))  Culture, blood (routine x 2)     Status: None   Collection Time: 04/28/17  7:35 PM  Result Value Ref Range Status   Specimen Description BLOOD RIGHT ANTECUBITAL  Final   Special Requests   Final    BOTTLES DRAWN AEROBIC AND ANAEROBIC Blood Culture adequate volume   Culture  NO GROWTH 5 DAYS  Final   Report Status 05/03/2017 FINAL  Final  Culture, blood (routine x 2)     Status: Abnormal   Collection Time: 04/28/17  7:55 PM  Result Value Ref Range Status   Specimen Description BLOOD LEFT ANTECUBITAL  Final   Special Requests   Final    BOTTLES DRAWN AEROBIC AND ANAEROBIC Blood Culture adequate volume   Culture  Setup Time   Final    GRAM POSITIVE RODS AEROBIC BOTTLE ONLY CRITICAL RESULT CALLED TO, READ BACK BY AND VERIFIED WITH: G.ABBOTT PHARMD 05/02/17 0140 L.CHAMPION    Culture (A)  Final    ACTINOMYCES ODONTOLYTICUS Standardized susceptibility testing for this organism is not available.    Report Status 05/04/2017 FINAL  Final  Blood Culture ID Panel (Reflexed)     Status: None   Collection Time: 04/28/17  7:55 PM  Result Value Ref Range Status   Enterococcus species NOT DETECTED NOT DETECTED Final   Vancomycin resistance NOT DETECTED NOT DETECTED Final   Listeria monocytogenes NOT DETECTED NOT DETECTED Final   Staphylococcus species NOT DETECTED NOT DETECTED Final   Staphylococcus aureus NOT DETECTED NOT  DETECTED Final   Methicillin resistance NOT DETECTED NOT DETECTED Final   Streptococcus species NOT DETECTED NOT DETECTED Final   Streptococcus agalactiae NOT DETECTED NOT DETECTED Final   Streptococcus pneumoniae NOT DETECTED NOT DETECTED Final   Streptococcus pyogenes NOT DETECTED NOT DETECTED Final   Acinetobacter baumannii NOT DETECTED NOT DETECTED Final   Enterobacteriaceae species NOT DETECTED NOT DETECTED Final   Enterobacter cloacae complex NOT DETECTED NOT DETECTED Final   Escherichia coli NOT DETECTED NOT DETECTED Final   Klebsiella oxytoca NOT DETECTED NOT DETECTED Final   Klebsiella pneumoniae NOT DETECTED NOT DETECTED Final   Proteus species NOT DETECTED NOT DETECTED Final   Serratia marcescens NOT DETECTED NOT DETECTED Final   Carbapenem resistance NOT DETECTED NOT DETECTED Final   Haemophilus influenzae NOT DETECTED NOT DETECTED Final   Neisseria meningitidis NOT DETECTED NOT DETECTED Final   Pseudomonas aeruginosa NOT DETECTED NOT DETECTED Final   Candida albicans NOT DETECTED NOT DETECTED Final   Candida glabrata NOT DETECTED NOT DETECTED Final   Candida krusei NOT DETECTED NOT DETECTED Final   Candida parapsilosis NOT DETECTED NOT DETECTED Final   Candida tropicalis NOT DETECTED NOT DETECTED Final  MRSA PCR Screening     Status: None   Collection Time: 04/29/17  1:16 AM  Result Value Ref Range Status   MRSA by PCR NEGATIVE NEGATIVE Final    Comment:        The GeneXpert MRSA Assay (FDA approved for NASAL specimens only), is one component of a comprehensive MRSA colonization surveillance program. It is not intended to diagnose MRSA infection nor to guide or monitor treatment for MRSA infections.   Urine culture     Status: None   Collection Time: 04/29/17  8:40 AM  Result Value Ref Range Status   Specimen Description URINE, CLEAN CATCH  Final   Special Requests NONE  Final   Culture NO GROWTH  Final   Report Status 04/30/2017 FINAL  Final     Labs: BNP  (last 3 results) No results for input(s): BNP in the last 8760 hours. Basic Metabolic Panel:  Recent Labs Lab 04/30/17 0216 05/01/17 0248 05/02/17 0330 05/03/17 0423 05/04/17 0735  NA 132* 130* 134* 134* 135  K 4.3 4.1 3.4* 4.2 3.4*  CL 109 105 108 108 109  CO2 20* 21* 22  22 20*  GLUCOSE 110* 111* 126* 104* 122*  BUN 39* 36* 26* 13 13  CREATININE 3.29* 2.19* 1.42* 1.03 0.95  CALCIUM 9.6 8.8* 8.4* 8.2* 8.1*  MG  --   --  1.1*  --   --   PHOS  --   --  2.0*  --   --    Liver Function Tests:  Recent Labs Lab 04/28/17 1910 05/01/17 0248  AST 100* 56*  ALT 53 39  ALKPHOS 122 81  BILITOT 2.1* 3.2*  PROT 6.8 6.0*  ALBUMIN 2.9* 2.4*    Recent Labs Lab 04/28/17 1910  LIPASE 122*   No results for input(s): AMMONIA in the last 168 hours. CBC:  Recent Labs Lab 04/28/17 1910 04/29/17 0649  04/30/17 1843 05/01/17 1129 05/01/17 1825 05/02/17 0738 05/03/17 0423  WBC 8.1 7.9  < > 7.1 5.6 6.5 7.6 8.1  NEUTROABS 4.7 4.7  --   --   --   --   --   --   HGB 3.3* 7.4*  < > 8.4* 8.1* 8.3* 7.9* 8.4*  HCT 9.2* 20.9*  < > 24.7* 24.1* 24.5* 23.9* 24.7*  MCV 89.3 87.1  < > 90.1 92.0 92.8 93.0 93.6  PLT 39* 32*  < > 70* 68* 88* 91* 112*  < > = values in this interval not displayed. Cardiac Enzymes:  Recent Labs Lab 04/29/17 1127 04/30/17 0932 04/30/17 1417 04/30/17 2053  TROPONINI 4.44* 4.11* 2.57* 2.92*   BNP: Invalid input(s): POCBNP CBG:  Recent Labs Lab 04/28/17 1954  GLUCAP 129*   D-Dimer No results for input(s): DDIMER in the last 72 hours. Hgb A1c No results for input(s): HGBA1C in the last 72 hours. Lipid Profile No results for input(s): CHOL, HDL, LDLCALC, TRIG, CHOLHDL, LDLDIRECT in the last 72 hours. Thyroid function studies No results for input(s): TSH, T4TOTAL, T3FREE, THYROIDAB in the last 72 hours.  Invalid input(s): FREET3 Anemia work up No results for input(s): VITAMINB12, FOLATE, FERRITIN, TIBC, IRON, RETICCTPCT in the last 72  hours. Urinalysis    Component Value Date/Time   COLORURINE YELLOW 04/28/2017 0840   APPEARANCEUR CLEAR 04/28/2017 0840   APPEARANCEUR Cloudy 06/12/2012 1454   LABSPEC 1.016 04/28/2017 0840   LABSPEC 1.021 06/12/2012 1454   PHURINE 5.0 04/28/2017 0840   GLUCOSEU NEGATIVE 04/28/2017 0840   GLUCOSEU Negative 06/12/2012 1454   HGBUR LARGE (A) 04/28/2017 0840   BILIRUBINUR NEGATIVE 04/28/2017 0840   BILIRUBINUR Negative 06/12/2012 1454   KETONESUR NEGATIVE 04/28/2017 0840   PROTEINUR 30 (A) 04/28/2017 0840   NITRITE NEGATIVE 04/28/2017 0840   LEUKOCYTESUR TRACE (A) 04/28/2017 0840   LEUKOCYTESUR 1+ 06/12/2012 1454   Sepsis Labs Invalid input(s): PROCALCITONIN,  WBC,  LACTICIDVEN   Time coordinating discharge: Over 30 minutes  SIGNED:   Janece Canterbury, MD  [facility] 05/04/2017, 1:54 PM Pager   If 7PM-7AM, please contact night-coverage [email or web address] Password [email or web address]

## 2017-05-04 NOTE — Progress Notes (Signed)
Nutrition Follow-up  DOCUMENTATION CODES:   Non-severe (moderate) malnutrition in context of chronic illness  INTERVENTION:   -Continue Boost Breeze po TID, each supplement provides 250 kcal and 9 grams of protein  NUTRITION DIAGNOSIS:   Malnutrition (moderate) related to chronic illness (hepatic cirrhosis) as evidenced by moderate depletion of body fat, moderate depletions of muscle mass, percent weight loss.  Ongoing  GOAL:   Patient will meet greater than or equal to 90% of their needs  Progressing  MONITOR:   PO intake, Supplement acceptance, Labs, Weight trends, Skin, I & O's  REASON FOR ASSESSMENT:   Malnutrition Screening Tool    ASSESSMENT:   61 yr old Male, smoker, who presented 9/15 with reports of dizziness/lightheadedness, dark stools & 45 lb weight loss in last 2 months.  9/18- s/p endoscopy, which revealed grade 1 esophageal varices, small hiatal hernia, normal duodenal bulb 9/20- s/p colonoscopy, which revealed external hemorrhoids on perianal exam, end to end colo-colonic anastomosis   Pt and family member sleeping sound at time of visit. Intake has improved; PO: 75% of meal completion per doc flowsheets. Noted pt also consumed 100% of breakfast tray per RD observation. Pt is consuming Boost Breeze supplements per MAR.   Labs reviewed: K: 3.4  Diet Order:  Diet Heart Room service appropriate? Yes; Fluid consistency: Thin Diet - low sodium heart healthy  Skin:  Reviewed, no issues  Last BM:  05/02/17  Height:   Ht Readings from Last 1 Encounters:  05/04/17 5\' 7"  (1.702 m)    Weight:   Wt Readings from Last 1 Encounters:  05/04/17 152 lb 11.2 oz (69.3 kg)    Ideal Body Weight:  73 kg  BMI:  Body mass index is 23.92 kg/m.  Estimated Nutritional Needs:   Kcal:  1700-1900  Protein:  95-110 gm  Fluid:  1.7-1.9 L  EDUCATION NEEDS:   No education needs identified at this time  Karyss Frese A. Jimmye Norman, RD, LDN, CDE Pager:  [phone] After hours Pager: [phone]

## 2017-05-04 NOTE — Progress Notes (Signed)
SATURATION QUALIFICATIONS: (This note is used to comply with regulatory documentation for home oxygen)  Patient Saturations on Room Air at Rest = 97%  Patient Saturations on Room Air while Ambulating = 90%  

## 2017-05-21 DIAGNOSIS — Z79899 Other long term (current) drug therapy: Secondary | ICD-10-CM | POA: Diagnosis not present

## 2017-05-21 DIAGNOSIS — Z789 Other specified health status: Secondary | ICD-10-CM | POA: Diagnosis not present

## 2017-05-21 DIAGNOSIS — Z23 Encounter for immunization: Secondary | ICD-10-CM | POA: Diagnosis not present

## 2017-05-21 DIAGNOSIS — G4700 Insomnia, unspecified: Secondary | ICD-10-CM | POA: Diagnosis not present

## 2017-05-21 DIAGNOSIS — Z720 Tobacco use: Secondary | ICD-10-CM | POA: Diagnosis not present

## 2017-05-21 DIAGNOSIS — K922 Gastrointestinal hemorrhage, unspecified: Secondary | ICD-10-CM | POA: Diagnosis not present

## 2017-05-21 DIAGNOSIS — R634 Abnormal weight loss: Secondary | ICD-10-CM | POA: Diagnosis not present

## 2017-06-01 ENCOUNTER — Other Ambulatory Visit: Payer: Self-pay | Admitting: Gastroenterology

## 2017-06-01 DIAGNOSIS — I8500 Esophageal varices without bleeding: Secondary | ICD-10-CM | POA: Diagnosis not present

## 2017-06-01 DIAGNOSIS — D62 Acute posthemorrhagic anemia: Secondary | ICD-10-CM

## 2017-06-01 DIAGNOSIS — Z9889 Other specified postprocedural states: Secondary | ICD-10-CM | POA: Diagnosis not present

## 2017-06-01 DIAGNOSIS — K7030 Alcoholic cirrhosis of liver without ascites: Secondary | ICD-10-CM | POA: Diagnosis not present

## 2017-07-03 DIAGNOSIS — Z1389 Encounter for screening for other disorder: Secondary | ICD-10-CM | POA: Diagnosis not present

## 2017-07-03 DIAGNOSIS — Z789 Other specified health status: Secondary | ICD-10-CM | POA: Diagnosis not present

## 2017-07-03 DIAGNOSIS — G4700 Insomnia, unspecified: Secondary | ICD-10-CM | POA: Diagnosis not present

## 2017-07-03 DIAGNOSIS — K7030 Alcoholic cirrhosis of liver without ascites: Secondary | ICD-10-CM | POA: Diagnosis not present

## 2017-07-03 DIAGNOSIS — Z720 Tobacco use: Secondary | ICD-10-CM | POA: Diagnosis not present

## 2017-07-03 DIAGNOSIS — I8500 Esophageal varices without bleeding: Secondary | ICD-10-CM | POA: Diagnosis not present

## 2017-07-03 DIAGNOSIS — K922 Gastrointestinal hemorrhage, unspecified: Secondary | ICD-10-CM | POA: Diagnosis not present

## 2017-07-03 DIAGNOSIS — R634 Abnormal weight loss: Secondary | ICD-10-CM | POA: Diagnosis not present

## 2017-07-03 DIAGNOSIS — D649 Anemia, unspecified: Secondary | ICD-10-CM | POA: Diagnosis not present

## 2017-08-01 DIAGNOSIS — G4700 Insomnia, unspecified: Secondary | ICD-10-CM | POA: Diagnosis not present

## 2017-08-01 DIAGNOSIS — Z720 Tobacco use: Secondary | ICD-10-CM | POA: Diagnosis not present

## 2017-08-01 DIAGNOSIS — D649 Anemia, unspecified: Secondary | ICD-10-CM | POA: Diagnosis not present

## 2017-08-01 DIAGNOSIS — Z789 Other specified health status: Secondary | ICD-10-CM | POA: Diagnosis not present

## 2017-08-01 DIAGNOSIS — J329 Chronic sinusitis, unspecified: Secondary | ICD-10-CM | POA: Diagnosis not present

## 2017-08-01 DIAGNOSIS — R634 Abnormal weight loss: Secondary | ICD-10-CM | POA: Diagnosis not present

## 2017-08-01 DIAGNOSIS — I8500 Esophageal varices without bleeding: Secondary | ICD-10-CM | POA: Diagnosis not present

## 2017-08-01 DIAGNOSIS — H6981 Other specified disorders of Eustachian tube, right ear: Secondary | ICD-10-CM | POA: Diagnosis not present

## 2017-08-01 DIAGNOSIS — K7030 Alcoholic cirrhosis of liver without ascites: Secondary | ICD-10-CM | POA: Diagnosis not present

## 2017-08-14 DIAGNOSIS — I219 Acute myocardial infarction, unspecified: Secondary | ICD-10-CM

## 2017-08-14 HISTORY — DX: Acute myocardial infarction, unspecified: I21.9

## 2017-10-08 DIAGNOSIS — D649 Anemia, unspecified: Secondary | ICD-10-CM | POA: Diagnosis not present

## 2017-10-08 DIAGNOSIS — R270 Ataxia, unspecified: Secondary | ICD-10-CM | POA: Diagnosis not present

## 2017-10-08 DIAGNOSIS — Z720 Tobacco use: Secondary | ICD-10-CM | POA: Diagnosis not present

## 2017-10-08 DIAGNOSIS — K7030 Alcoholic cirrhosis of liver without ascites: Secondary | ICD-10-CM | POA: Diagnosis not present

## 2017-10-08 DIAGNOSIS — G4700 Insomnia, unspecified: Secondary | ICD-10-CM | POA: Diagnosis not present

## 2017-10-08 DIAGNOSIS — H6981 Other specified disorders of Eustachian tube, right ear: Secondary | ICD-10-CM | POA: Diagnosis not present

## 2017-10-08 DIAGNOSIS — Z789 Other specified health status: Secondary | ICD-10-CM | POA: Diagnosis not present

## 2017-10-08 DIAGNOSIS — G4710 Hypersomnia, unspecified: Secondary | ICD-10-CM | POA: Diagnosis not present

## 2017-10-08 DIAGNOSIS — R634 Abnormal weight loss: Secondary | ICD-10-CM | POA: Diagnosis not present

## 2017-10-08 DIAGNOSIS — I8500 Esophageal varices without bleeding: Secondary | ICD-10-CM | POA: Diagnosis not present

## 2017-10-08 DIAGNOSIS — L0292 Furuncle, unspecified: Secondary | ICD-10-CM | POA: Diagnosis not present

## 2017-10-09 DIAGNOSIS — K7030 Alcoholic cirrhosis of liver without ascites: Secondary | ICD-10-CM | POA: Diagnosis not present

## 2017-10-12 ENCOUNTER — Other Ambulatory Visit: Payer: Self-pay | Admitting: Internal Medicine

## 2017-10-12 DIAGNOSIS — R634 Abnormal weight loss: Secondary | ICD-10-CM

## 2017-10-12 DIAGNOSIS — R630 Anorexia: Secondary | ICD-10-CM

## 2017-10-16 ENCOUNTER — Ambulatory Visit
Admission: RE | Admit: 2017-10-16 | Discharge: 2017-10-16 | Disposition: A | Discharge status: Home / Self Care | Payer: Medicare Other | Source: Ambulatory Visit | Attending: Internal Medicine | Admitting: Internal Medicine

## 2017-10-16 ENCOUNTER — Other Ambulatory Visit: Payer: Self-pay | Admitting: Internal Medicine

## 2017-10-16 DIAGNOSIS — R630 Anorexia: Secondary | ICD-10-CM

## 2017-10-16 DIAGNOSIS — R634 Abnormal weight loss: Secondary | ICD-10-CM

## 2017-10-16 DIAGNOSIS — I712 Thoracic aortic aneurysm, without rupture: Secondary | ICD-10-CM | POA: Diagnosis not present

## 2017-10-16 DIAGNOSIS — K7460 Unspecified cirrhosis of liver: Secondary | ICD-10-CM | POA: Diagnosis not present

## 2017-10-16 MED ORDER — IOPAMIDOL (ISOVUE-300) INJECTION 61%
100.0000 mL | Freq: Once | INTRAVENOUS | Status: AC | PRN
  Administered 2017-10-16: 100 mL via INTRAVENOUS

## 2017-10-22 ENCOUNTER — Other Ambulatory Visit: Payer: Self-pay | Admitting: Internal Medicine

## 2017-10-22 DIAGNOSIS — I700 Atherosclerosis of aorta: Secondary | ICD-10-CM

## 2017-12-10 DIAGNOSIS — K7030 Alcoholic cirrhosis of liver without ascites: Secondary | ICD-10-CM | POA: Diagnosis not present

## 2017-12-10 DIAGNOSIS — R270 Ataxia, unspecified: Secondary | ICD-10-CM | POA: Diagnosis not present

## 2017-12-10 DIAGNOSIS — I8500 Esophageal varices without bleeding: Secondary | ICD-10-CM | POA: Diagnosis not present

## 2017-12-10 DIAGNOSIS — Z720 Tobacco use: Secondary | ICD-10-CM | POA: Diagnosis not present

## 2017-12-10 DIAGNOSIS — G4700 Insomnia, unspecified: Secondary | ICD-10-CM | POA: Diagnosis not present

## 2017-12-10 DIAGNOSIS — J449 Chronic obstructive pulmonary disease, unspecified: Secondary | ICD-10-CM | POA: Diagnosis not present

## 2017-12-10 DIAGNOSIS — Z789 Other specified health status: Secondary | ICD-10-CM | POA: Diagnosis not present

## 2017-12-10 DIAGNOSIS — L0292 Furuncle, unspecified: Secondary | ICD-10-CM | POA: Diagnosis not present

## 2017-12-10 DIAGNOSIS — D649 Anemia, unspecified: Secondary | ICD-10-CM | POA: Diagnosis not present

## 2018-02-08 ENCOUNTER — Other Ambulatory Visit: Payer: Self-pay | Admitting: Internal Medicine

## 2018-02-08 DIAGNOSIS — F17210 Nicotine dependence, cigarettes, uncomplicated: Principal | ICD-10-CM

## 2018-02-11 ENCOUNTER — Ambulatory Visit (INDEPENDENT_AMBULATORY_CARE_PROVIDER_SITE_OTHER): Payer: Medicare Other | Admitting: Internal Medicine

## 2018-02-11 DIAGNOSIS — F17210 Nicotine dependence, cigarettes, uncomplicated: Secondary | ICD-10-CM | POA: Diagnosis not present

## 2018-02-11 LAB — PULMONARY FUNCTION TEST
FEF 25-75 Pre: 1.61 L/sec
FEF2575-%Pred-Pre: 64 %
FEV1-%Pred-Pre: 77 %
FEV1-PRE: 2.39 L
FEV1FVC-%Pred-Pre: 94 %
FEV6-%PRED-PRE: 86 %
FEV6-Pre: 3.36 L
FEV6FVC-%PRED-PRE: 105 %
FVC-%PRED-PRE: 81 %
FVC-Pre: 3.36 L
Pre FEV1/FVC ratio: 71 %
Pre FEV6/FVC Ratio: 100 %

## 2018-02-11 NOTE — Progress Notes (Signed)
Patient completed [name] only today.

## 2018-05-21 DIAGNOSIS — K7030 Alcoholic cirrhosis of liver without ascites: Secondary | ICD-10-CM | POA: Diagnosis not present

## 2018-05-21 DIAGNOSIS — D509 Iron deficiency anemia, unspecified: Secondary | ICD-10-CM | POA: Diagnosis not present

## 2018-05-21 DIAGNOSIS — Z0000 Encounter for general adult medical examination without abnormal findings: Secondary | ICD-10-CM | POA: Diagnosis not present

## 2018-05-21 DIAGNOSIS — G4700 Insomnia, unspecified: Secondary | ICD-10-CM | POA: Diagnosis not present

## 2018-05-21 DIAGNOSIS — Z1389 Encounter for screening for other disorder: Secondary | ICD-10-CM | POA: Diagnosis not present

## 2018-05-21 DIAGNOSIS — Z720 Tobacco use: Secondary | ICD-10-CM | POA: Diagnosis not present

## 2018-05-21 DIAGNOSIS — Z23 Encounter for immunization: Secondary | ICD-10-CM | POA: Diagnosis not present

## 2018-05-21 DIAGNOSIS — B370 Candidal stomatitis: Secondary | ICD-10-CM | POA: Diagnosis not present

## 2018-05-21 DIAGNOSIS — Z1211 Encounter for screening for malignant neoplasm of colon: Secondary | ICD-10-CM | POA: Diagnosis not present

## 2018-05-21 DIAGNOSIS — R197 Diarrhea, unspecified: Secondary | ICD-10-CM | POA: Diagnosis not present

## 2018-05-21 DIAGNOSIS — I8500 Esophageal varices without bleeding: Secondary | ICD-10-CM | POA: Diagnosis not present

## 2018-05-21 DIAGNOSIS — Z125 Encounter for screening for malignant neoplasm of prostate: Secondary | ICD-10-CM | POA: Diagnosis not present

## 2018-08-04 IMAGING — CT CT CHEST LUNG CANCER SCREENING LOW DOSE W/O CM
2 of 4 series · 15 of 40 positions shown, 18 images · non-contrast
Comparison: 04/30/2015

CLINICAL DATA: 60-year-old male with 46 pack-year history of
smoking. Lung cancer screening.

EXAM:
CT CHEST WITHOUT CONTRAST LOW-DOSE FOR LUNG CANCER SCREENING
TECHNIQUE: Multidetector CT imaging of the chest was performed following the
standard protocol without IV contrast.

[Series 2: thorax 5.0 i31f 3 · axial · 0.79mm/px · z∈[-366,-76]mm · 12 of 70 slices shown, 15 images]
[im 6/70  mediastinal]
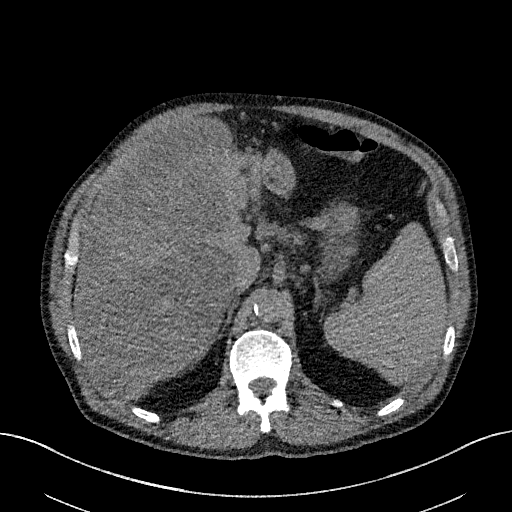
[im 6/70  lung]
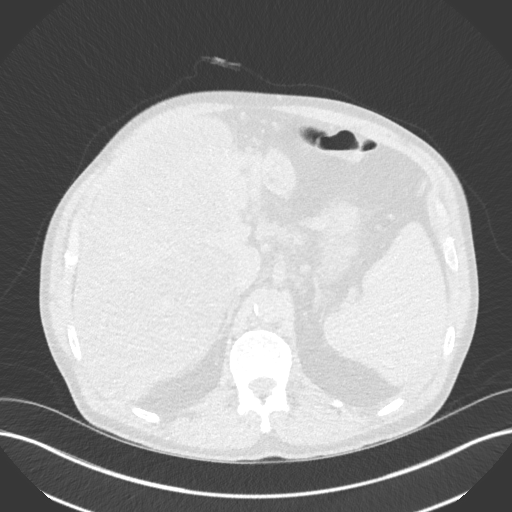
[im 11/70  lung]
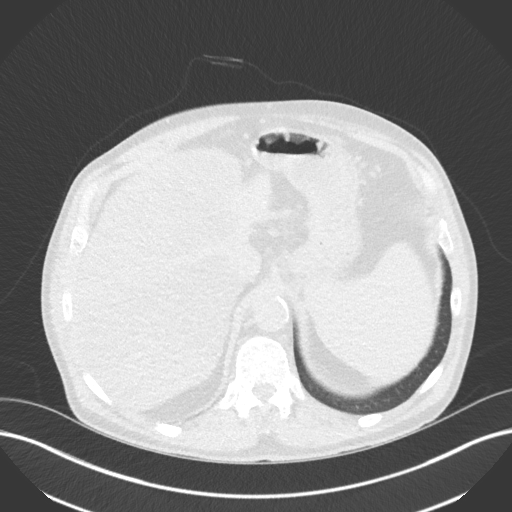
[im 16/70  lung]
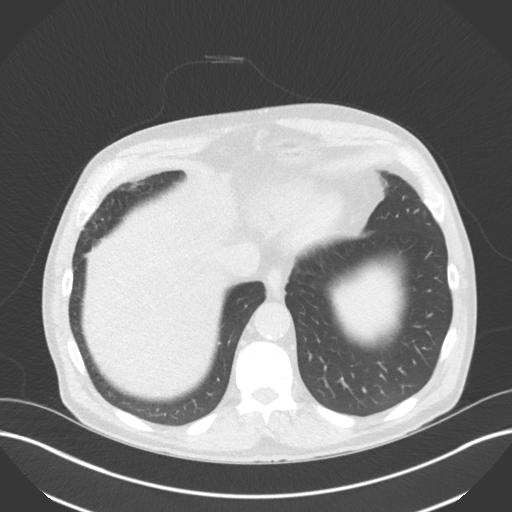
[im 22/70  lung]
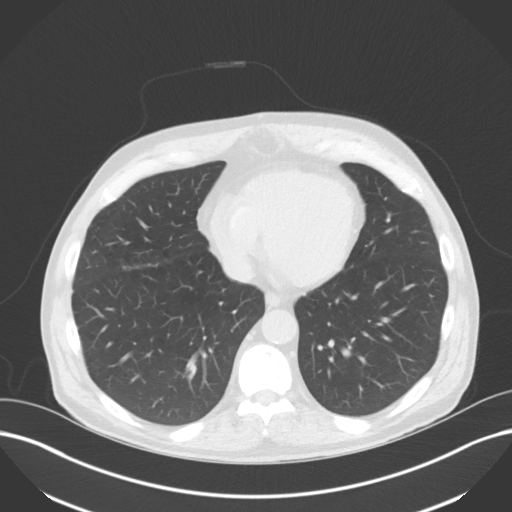
[im 27/70  mediastinal]
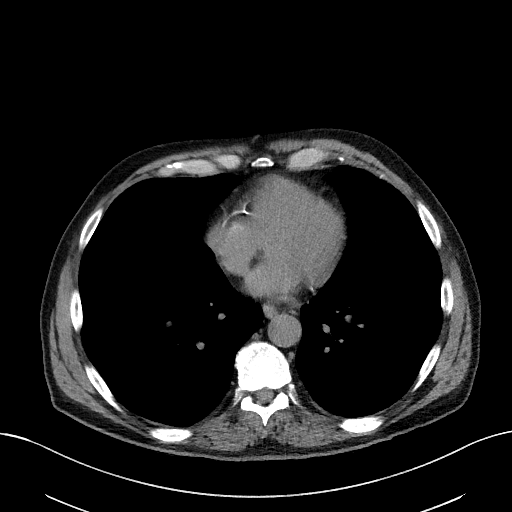
[im 27/70  lung]
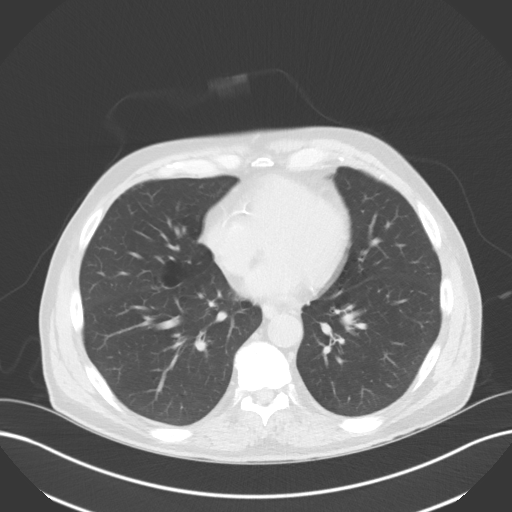
[im 32/70  lung]
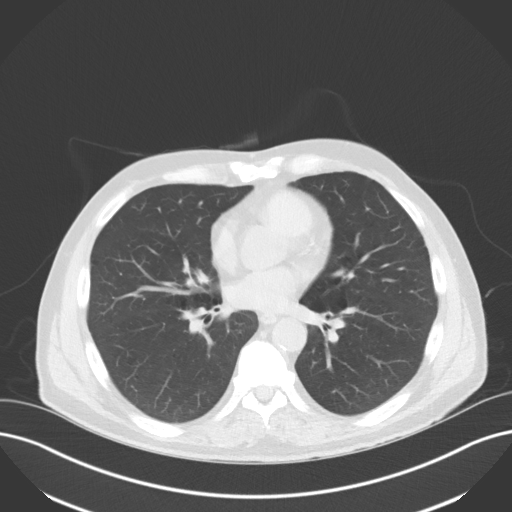
[im 38/70  lung]
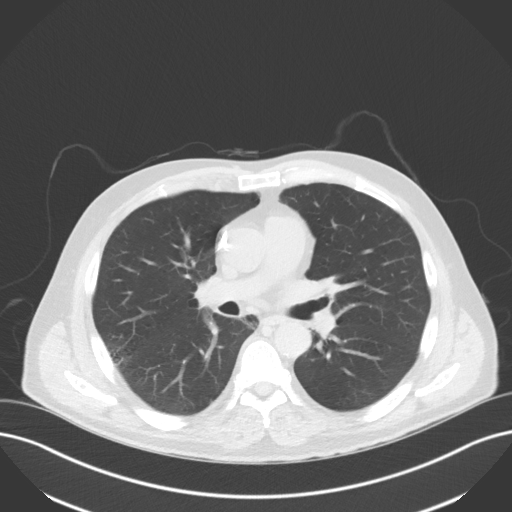
[im 43/70  lung]
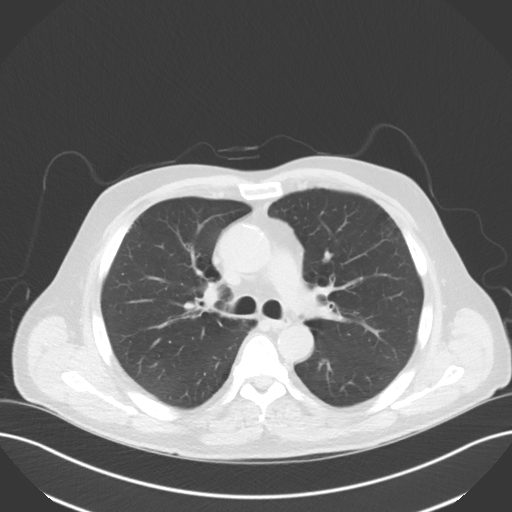
[im 48/70  mediastinal]
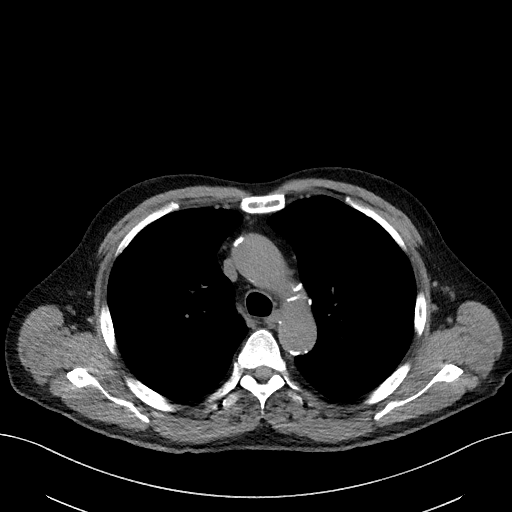
[im 48/70  lung]
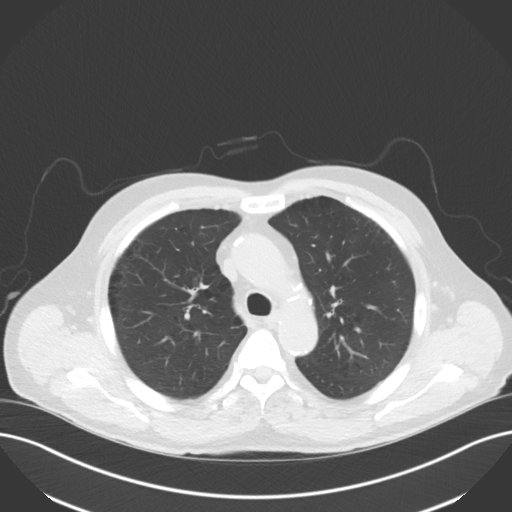
[im 54/70  lung]
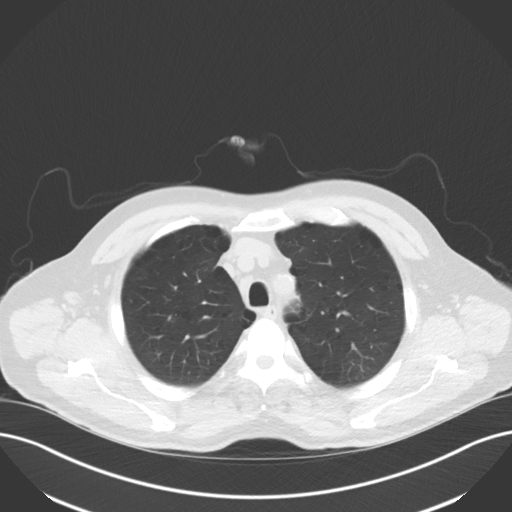
[im 59/70  lung]
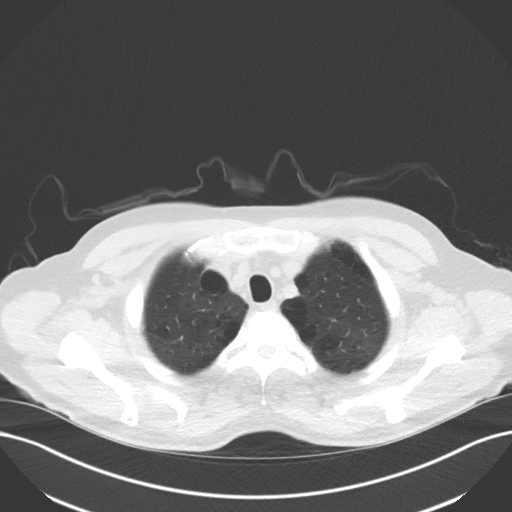
[im 64/70  lung]
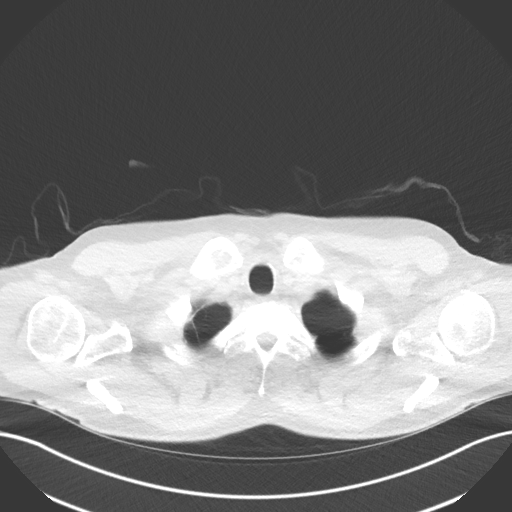

[Series 5: coronal · coronal · 0.68mm/px · 3 of 124 slices shown]
[im 25/124  lung]
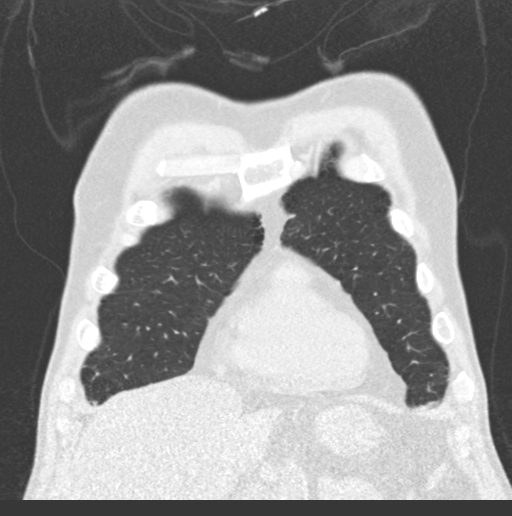
[im 50/124  lung]
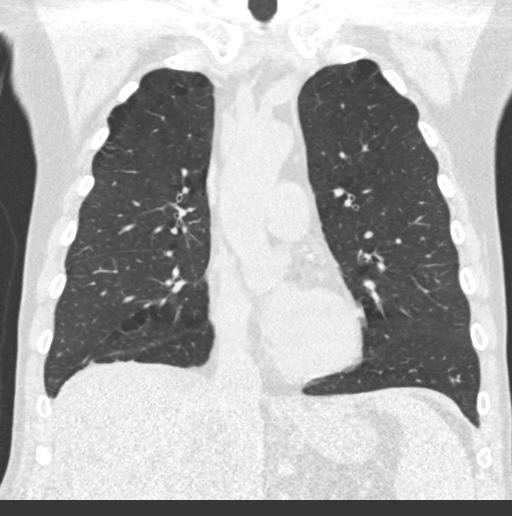
[im 74/124  lung]
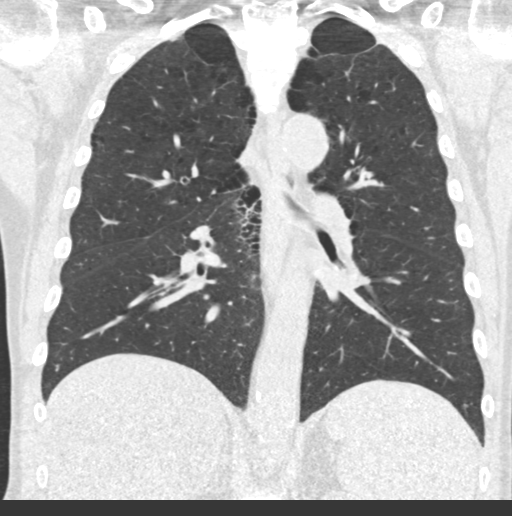

[15 of 40 positions shown; findings below may reference images not displayed]

FINDINGS: Cardiovascular: Heart size normal. Coronary artery calcification is
noted. Atherosclerotic calcification is noted in the wall of the
thoracic aorta. No pericardial effusion.

Mediastinum/Nodes: No mediastinal lymphadenopathy. No evidence for
gross hilar lymphadenopathy although assessment is limited by the
lack of intravenous contrast on today's study. The esophagus has
normal imaging features. There is no axillary lymphadenopathy.

Lungs/Pleura: No suspicious pulmonary nodule or mass is identified.
Centrilobular and paraseptal emphysema is noted bilaterally.
Bronchial wall thickening is associated. There is no focal airspace
consolidation. No pulmonary edema or pleural effusion.

Upper Abdomen: The liver shows diffusely decreased attenuation
suggesting steatosis.

Musculoskeletal: Bone windows reveal no worrisome lytic or sclerotic
osseous lesions. Old left clavicle fracture noted.
IMPRESSION: 1. Lung-RADS Category 1, negative. Continue annual screening with
low-dose chest CT without contrast in 12 months.
2. Emphysema.
3. Coronary artery atherosclerosis

## 2018-10-18 ENCOUNTER — Other Ambulatory Visit: Payer: Self-pay | Admitting: Internal Medicine

## 2018-10-18 DIAGNOSIS — R161 Splenomegaly, not elsewhere classified: Secondary | ICD-10-CM

## 2018-10-18 DIAGNOSIS — K7460 Unspecified cirrhosis of liver: Secondary | ICD-10-CM

## 2018-10-18 DIAGNOSIS — I700 Atherosclerosis of aorta: Secondary | ICD-10-CM

## 2018-10-21 ENCOUNTER — Other Ambulatory Visit: Payer: Medicare Other

## 2019-05-29 DIAGNOSIS — Z0000 Encounter for general adult medical examination without abnormal findings: Secondary | ICD-10-CM | POA: Diagnosis not present

## 2019-05-29 DIAGNOSIS — D649 Anemia, unspecified: Secondary | ICD-10-CM | POA: Diagnosis not present

## 2019-05-29 DIAGNOSIS — I8500 Esophageal varices without bleeding: Secondary | ICD-10-CM | POA: Diagnosis not present

## 2019-05-29 DIAGNOSIS — E559 Vitamin D deficiency, unspecified: Secondary | ICD-10-CM | POA: Diagnosis not present

## 2019-05-29 DIAGNOSIS — J449 Chronic obstructive pulmonary disease, unspecified: Secondary | ICD-10-CM | POA: Diagnosis not present

## 2019-05-29 DIAGNOSIS — G4700 Insomnia, unspecified: Secondary | ICD-10-CM | POA: Diagnosis not present

## 2019-05-29 DIAGNOSIS — Z23 Encounter for immunization: Secondary | ICD-10-CM | POA: Diagnosis not present

## 2019-05-29 DIAGNOSIS — D509 Iron deficiency anemia, unspecified: Secondary | ICD-10-CM | POA: Diagnosis not present

## 2019-05-29 DIAGNOSIS — Z789 Other specified health status: Secondary | ICD-10-CM | POA: Diagnosis not present

## 2019-05-29 DIAGNOSIS — Z1389 Encounter for screening for other disorder: Secondary | ICD-10-CM | POA: Diagnosis not present

## 2019-05-29 DIAGNOSIS — R197 Diarrhea, unspecified: Secondary | ICD-10-CM | POA: Diagnosis not present

## 2019-05-29 DIAGNOSIS — K7030 Alcoholic cirrhosis of liver without ascites: Secondary | ICD-10-CM | POA: Diagnosis not present

## 2019-05-29 DIAGNOSIS — Z125 Encounter for screening for malignant neoplasm of prostate: Secondary | ICD-10-CM | POA: Diagnosis not present

## 2019-05-29 DIAGNOSIS — Z720 Tobacco use: Secondary | ICD-10-CM | POA: Diagnosis not present

## 2019-05-29 DIAGNOSIS — L0292 Furuncle, unspecified: Secondary | ICD-10-CM | POA: Diagnosis not present

## 2019-09-18 ENCOUNTER — Inpatient Hospital Stay (HOSPITAL_COMMUNITY)
Admission: EM | Admit: 2019-09-18 | Discharge: 2019-09-21 | DRG: 641 | Disposition: A | Discharge status: Home / Self Care | Payer: Medicare Other | Source: Ambulatory Visit | Attending: Family Medicine | Admitting: Family Medicine

## 2019-09-18 ENCOUNTER — Other Ambulatory Visit: Payer: Self-pay

## 2019-09-18 ENCOUNTER — Encounter (HOSPITAL_COMMUNITY): Payer: Self-pay | Admitting: Emergency Medicine

## 2019-09-18 DIAGNOSIS — D539 Nutritional anemia, unspecified: Secondary | ICD-10-CM | POA: Diagnosis present

## 2019-09-18 DIAGNOSIS — F17210 Nicotine dependence, cigarettes, uncomplicated: Secondary | ICD-10-CM | POA: Diagnosis present

## 2019-09-18 DIAGNOSIS — N179 Acute kidney failure, unspecified: Secondary | ICD-10-CM | POA: Diagnosis not present

## 2019-09-18 DIAGNOSIS — M545 Low back pain, unspecified: Secondary | ICD-10-CM

## 2019-09-18 DIAGNOSIS — M62830 Muscle spasm of back: Secondary | ICD-10-CM | POA: Diagnosis not present

## 2019-09-18 DIAGNOSIS — D6959 Other secondary thrombocytopenia: Secondary | ICD-10-CM | POA: Diagnosis present

## 2019-09-18 DIAGNOSIS — R8290 Unspecified abnormal findings in urine: Secondary | ICD-10-CM | POA: Diagnosis not present

## 2019-09-18 DIAGNOSIS — Z96643 Presence of artificial hip joint, bilateral: Secondary | ICD-10-CM | POA: Diagnosis present

## 2019-09-18 DIAGNOSIS — Z885 Allergy status to narcotic agent status: Secondary | ICD-10-CM

## 2019-09-18 DIAGNOSIS — K7030 Alcoholic cirrhosis of liver without ascites: Secondary | ICD-10-CM | POA: Diagnosis present

## 2019-09-18 DIAGNOSIS — D649 Anemia, unspecified: Secondary | ICD-10-CM | POA: Diagnosis present

## 2019-09-18 DIAGNOSIS — K766 Portal hypertension: Secondary | ICD-10-CM | POA: Diagnosis not present

## 2019-09-18 DIAGNOSIS — Z03818 Encounter for observation for suspected exposure to other biological agents ruled out: Secondary | ICD-10-CM | POA: Diagnosis not present

## 2019-09-18 DIAGNOSIS — K219 Gastro-esophageal reflux disease without esophagitis: Secondary | ICD-10-CM | POA: Diagnosis not present

## 2019-09-18 DIAGNOSIS — Z20822 Contact with and (suspected) exposure to covid-19: Secondary | ICD-10-CM | POA: Diagnosis present

## 2019-09-18 DIAGNOSIS — N200 Calculus of kidney: Secondary | ICD-10-CM | POA: Diagnosis not present

## 2019-09-18 DIAGNOSIS — I10 Essential (primary) hypertension: Secondary | ICD-10-CM | POA: Diagnosis present

## 2019-09-18 DIAGNOSIS — E860 Dehydration: Secondary | ICD-10-CM | POA: Diagnosis present

## 2019-09-18 DIAGNOSIS — Z888 Allergy status to other drugs, medicaments and biological substances status: Secondary | ICD-10-CM

## 2019-09-18 DIAGNOSIS — Z800 Family history of malignant neoplasm of digestive organs: Secondary | ICD-10-CM

## 2019-09-18 DIAGNOSIS — Z882 Allergy status to sulfonamides status: Secondary | ICD-10-CM

## 2019-09-18 DIAGNOSIS — D696 Thrombocytopenia, unspecified: Secondary | ICD-10-CM | POA: Diagnosis present
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
## 2019-09-18 NOTE — ED Triage Notes (Signed)
Patient complaining of flank pain. Patient also states his doctor called him today and told him to get to the emergency room or he was going to loose his kidneys.

## 2019-09-19 ENCOUNTER — Inpatient Hospital Stay (HOSPITAL_COMMUNITY): Payer: Medicare Other

## 2019-09-19 ENCOUNTER — Emergency Department (HOSPITAL_COMMUNITY): Payer: Medicare Other

## 2019-09-19 DIAGNOSIS — Z888 Allergy status to other drugs, medicaments and biological substances status: Secondary | ICD-10-CM | POA: Diagnosis not present

## 2019-09-19 DIAGNOSIS — D696 Thrombocytopenia, unspecified: Secondary | ICD-10-CM

## 2019-09-19 DIAGNOSIS — Z800 Family history of malignant neoplasm of digestive organs: Secondary | ICD-10-CM | POA: Diagnosis not present

## 2019-09-19 DIAGNOSIS — N200 Calculus of kidney: Secondary | ICD-10-CM | POA: Diagnosis not present

## 2019-09-19 DIAGNOSIS — E860 Dehydration: Secondary | ICD-10-CM | POA: Diagnosis present

## 2019-09-19 DIAGNOSIS — D649 Anemia, unspecified: Secondary | ICD-10-CM

## 2019-09-19 DIAGNOSIS — K219 Gastro-esophageal reflux disease without esophagitis: Secondary | ICD-10-CM | POA: Diagnosis present

## 2019-09-19 DIAGNOSIS — R9431 Abnormal electrocardiogram [ECG] [EKG]: Secondary | ICD-10-CM | POA: Diagnosis not present

## 2019-09-19 DIAGNOSIS — Z882 Allergy status to sulfonamides status: Secondary | ICD-10-CM | POA: Diagnosis not present

## 2019-09-19 DIAGNOSIS — Z20822 Contact with and (suspected) exposure to covid-19: Secondary | ICD-10-CM | POA: Diagnosis present

## 2019-09-19 DIAGNOSIS — D539 Nutritional anemia, unspecified: Secondary | ICD-10-CM | POA: Diagnosis present

## 2019-09-19 DIAGNOSIS — Z885 Allergy status to narcotic agent status: Secondary | ICD-10-CM | POA: Diagnosis not present

## 2019-09-19 DIAGNOSIS — N179 Acute kidney failure, unspecified: Secondary | ICD-10-CM

## 2019-09-19 DIAGNOSIS — K7030 Alcoholic cirrhosis of liver without ascites: Secondary | ICD-10-CM | POA: Diagnosis present

## 2019-09-19 DIAGNOSIS — R82998 Other abnormal findings in urine: Secondary | ICD-10-CM | POA: Diagnosis not present

## 2019-09-19 DIAGNOSIS — I10 Essential (primary) hypertension: Secondary | ICD-10-CM | POA: Diagnosis present

## 2019-09-19 DIAGNOSIS — D6959 Other secondary thrombocytopenia: Secondary | ICD-10-CM | POA: Diagnosis present

## 2019-09-19 DIAGNOSIS — F17210 Nicotine dependence, cigarettes, uncomplicated: Secondary | ICD-10-CM | POA: Diagnosis present

## 2019-09-19 DIAGNOSIS — Z96643 Presence of artificial hip joint, bilateral: Secondary | ICD-10-CM | POA: Diagnosis present

## 2019-09-19 DIAGNOSIS — K766 Portal hypertension: Secondary | ICD-10-CM | POA: Diagnosis present

## 2019-09-19 LAB — COMPREHENSIVE METABOLIC PANEL
ALT: 19 U/L (ref 0–44)
ALT: 21 U/L (ref 0–44)
AST: 31 U/L (ref 15–41)
AST: 33 U/L (ref 15–41)
Albumin: 3.5 g/dL (ref 3.5–5.0)
Albumin: 4.3 g/dL (ref 3.5–5.0)
Alkaline Phosphatase: 65 U/L (ref 38–126)
Alkaline Phosphatase: 76 U/L (ref 38–126)
Anion gap: 11 (ref 5–15)
Anion gap: 15 (ref 5–15)
BUN: 63 mg/dL — ABNORMAL HIGH (ref 8–23)
BUN: 68 mg/dL — ABNORMAL HIGH (ref 8–23)
CO2: 24 mmol/L (ref 22–32)
CO2: 25 mmol/L (ref 22–32)
Calcium: 14 mg/dL (ref 8.9–10.3)
Calcium: >15 mg/dL (ref 8.9–10.3)
Chloride: 103 mmol/L (ref 98–111)
Chloride: 98 mmol/L (ref 98–111)
Creatinine, Ser: 4.11 mg/dL — ABNORMAL HIGH (ref 0.61–1.24)
Creatinine, Ser: 4.48 mg/dL — ABNORMAL HIGH (ref 0.61–1.24)
GFR calc Af Amer: 15 mL/min — ABNORMAL LOW (ref >60)
GFR calc Af Amer: 17 mL/min — ABNORMAL LOW (ref >60)
GFR calc non Af Amer: 13 mL/min — ABNORMAL LOW (ref >60)
GFR calc non Af Amer: 14 mL/min — ABNORMAL LOW (ref >60)
Glucose, Bld: 87 mg/dL (ref 70–99)
Glucose, Bld: 98 mg/dL (ref 70–99)
Potassium: 3.8 mmol/L (ref 3.5–5.1)
Potassium: 4.2 mmol/L (ref 3.5–5.1)
Sodium: 137 mmol/L (ref 135–145)
Sodium: 139 mmol/L (ref 135–145)
Total Bilirubin: 2.9 mg/dL — ABNORMAL HIGH (ref 0.3–1.2)
Total Bilirubin: 3.9 mg/dL — ABNORMAL HIGH (ref 0.3–1.2)
Total Protein: 7.4 g/dL (ref 6.5–8.1)
Total Protein: 8.4 g/dL — ABNORMAL HIGH (ref 6.5–8.1)

## 2019-09-19 LAB — RENAL FUNCTION PANEL
Albumin: 3.7 g/dL (ref 3.5–5.0)
Anion gap: 8 (ref 5–15)
BUN: 61 mg/dL — ABNORMAL HIGH (ref 8–23)
CO2: 27 mmol/L (ref 22–32)
Calcium: 13 mg/dL — ABNORMAL HIGH (ref 8.9–10.3)
Chloride: 104 mmol/L (ref 98–111)
Creatinine, Ser: 3.87 mg/dL — ABNORMAL HIGH (ref 0.61–1.24)
GFR calc Af Amer: 18 mL/min — ABNORMAL LOW (ref >60)
GFR calc non Af Amer: 15 mL/min — ABNORMAL LOW (ref >60)
Glucose, Bld: 93 mg/dL (ref 70–99)
Phosphorus: 4.2 mg/dL (ref 2.5–4.6)
Potassium: 4 mmol/L (ref 3.5–5.1)
Sodium: 139 mmol/L (ref 135–145)

## 2019-09-19 LAB — CBC
HCT: 30.8 % — ABNORMAL LOW (ref 39.0–52.0)
Hemoglobin: 10.7 g/dL — ABNORMAL LOW (ref 13.0–17.0)
MCH: 36.4 pg — ABNORMAL HIGH (ref 26.0–34.0)
MCHC: 34.7 g/dL (ref 30.0–36.0)
MCV: 104.8 fL — ABNORMAL HIGH (ref 80.0–100.0)
Platelets: 38 10*3/uL — ABNORMAL LOW (ref 150–400)
RBC: 2.94 MIL/uL — ABNORMAL LOW (ref 4.22–5.81)
RDW: 14.5 % (ref 11.5–15.5)
WBC: 4 10*3/uL (ref 4.0–10.5)
nRBC: 0 % (ref 0.0–0.2)

## 2019-09-19 LAB — CBC WITH DIFFERENTIAL/PLATELET
Abs Immature Granulocytes: 0.01 10*3/uL (ref 0.00–0.07)
Basophils Absolute: 0 10*3/uL (ref 0.0–0.1)
Basophils Relative: 1 %
Eosinophils Absolute: 0.2 10*3/uL (ref 0.0–0.5)
Eosinophils Relative: 4 %
HCT: 34.1 % — ABNORMAL LOW (ref 39.0–52.0)
Hemoglobin: 11.7 g/dL — ABNORMAL LOW (ref 13.0–17.0)
Immature Granulocytes: 0 %
Lymphocytes Relative: 22 %
Lymphs Abs: 1.1 10*3/uL (ref 0.7–4.0)
MCH: 35.9 pg — ABNORMAL HIGH (ref 26.0–34.0)
MCHC: 34.3 g/dL (ref 30.0–36.0)
MCV: 104.6 fL — ABNORMAL HIGH (ref 80.0–100.0)
Monocytes Absolute: 0.5 10*3/uL (ref 0.1–1.0)
Monocytes Relative: 10 %
Neutro Abs: 3.1 10*3/uL (ref 1.7–7.7)
Neutrophils Relative %: 63 %
Platelets: 40 10*3/uL — ABNORMAL LOW (ref 150–400)
RBC: 3.26 MIL/uL — ABNORMAL LOW (ref 4.22–5.81)
RDW: 14.4 % (ref 11.5–15.5)
WBC: 4.9 10*3/uL (ref 4.0–10.5)
nRBC: 0 % (ref 0.0–0.2)

## 2019-09-19 LAB — SODIUM, URINE, RANDOM: Sodium, Ur: 23 mmol/L

## 2019-09-19 LAB — VITAMIN D 25 HYDROXY (VIT D DEFICIENCY, FRACTURES): Vit D, 25-Hydroxy: 48.85 ng/mL (ref 30–100)

## 2019-09-19 LAB — ECHOCARDIOGRAM COMPLETE
Height: 69 in
Weight: 2320 oz

## 2019-09-19 LAB — URINALYSIS, ROUTINE W REFLEX MICROSCOPIC
Bilirubin Urine: NEGATIVE
Glucose, UA: NEGATIVE mg/dL
Ketones, ur: NEGATIVE mg/dL
Nitrite: NEGATIVE
Protein, ur: 30 mg/dL — AB
Specific Gravity, Urine: 1.014 (ref 1.005–1.030)
pH: 5 (ref 5.0–8.0)

## 2019-09-19 LAB — RESPIRATORY PANEL BY RT PCR (FLU A&B, COVID)
Influenza A by PCR: NEGATIVE
Influenza B by PCR: NEGATIVE
SARS Coronavirus 2 by RT PCR: NEGATIVE

## 2019-09-19 LAB — IRON AND TIBC
Iron: 135 ug/dL (ref 45–182)
Saturation Ratios: 40 % — ABNORMAL HIGH (ref 17.9–39.5)
TIBC: 333 ug/dL (ref 250–450)
UIBC: 198 ug/dL

## 2019-09-19 LAB — FOLATE: Folate: 11.5 ng/mL (ref >5.9)

## 2019-09-19 LAB — VITAMIN B12: Vitamin B-12: 1338 pg/mL — ABNORMAL HIGH (ref 180–914)

## 2019-09-19 LAB — PROTIME-INR
INR: 1.4 — ABNORMAL HIGH (ref 0.8–1.2)
Prothrombin Time: 17.4 seconds — ABNORMAL HIGH (ref 11.4–15.2)

## 2019-09-19 LAB — FERRITIN: Ferritin: 187 ng/mL (ref 24–336)

## 2019-09-19 LAB — HIV ANTIBODY (ROUTINE TESTING W REFLEX): HIV Screen 4th Generation wRfx: NONREACTIVE

## 2019-09-19 LAB — PROTEIN / CREATININE RATIO, URINE
Creatinine, Urine: 282.87 mg/dL
Protein Creatinine Ratio: 0.11 mg/mg{Cre} (ref 0.00–0.15)
Total Protein, Urine: 30 mg/dL

## 2019-09-19 LAB — MAGNESIUM
Magnesium: 1.5 mg/dL — ABNORMAL LOW (ref 1.7–2.4)
Magnesium: 1.7 mg/dL (ref 1.7–2.4)

## 2019-09-19 LAB — TSH: TSH: 0.432 u[IU]/mL (ref 0.350–4.500)

## 2019-09-19 LAB — PHOSPHORUS: Phosphorus: 4.6 mg/dL (ref 2.5–4.6)

## 2019-09-19 MED ORDER — SODIUM CHLORIDE 0.9 % IV BOLUS
1000.0000 mL | Freq: Once | INTRAVENOUS | Status: AC
  Administered 2019-09-19: 02:00:00 1000 mL via INTRAVENOUS

## 2019-09-19 MED ORDER — LORAZEPAM 2 MG/ML IJ SOLN: 0.0000 mg | Freq: Four times a day (QID) | INTRAMUSCULAR | Status: AC

## 2019-09-19 MED ORDER — ACETAMINOPHEN 650 MG RE SUPP: 650.0000 mg | Freq: Four times a day (QID) | RECTAL | Status: DC | PRN

## 2019-09-19 MED ORDER — THIAMINE HCL 100 MG PO TABS
100.0000 mg | ORAL_TABLET | Freq: Every day | ORAL | Status: DC
  Administered 2019-09-19 – 2019-09-21 (×3): 100 mg via ORAL
  Filled 2019-09-19 (×3): qty 1

## 2019-09-19 MED ORDER — ADULT MULTIVITAMIN W/MINERALS CH
1.0000 | ORAL_TABLET | Freq: Every day | ORAL | Status: DC
  Administered 2019-09-19 – 2019-09-21 (×3): 1 via ORAL
  Filled 2019-09-19 (×3): qty 1

## 2019-09-19 MED ORDER — LORAZEPAM 2 MG/ML IJ SOLN: 1.0000 mg | INTRAMUSCULAR | Status: DC | PRN

## 2019-09-19 MED ORDER — THIAMINE HCL 100 MG/ML IJ SOLN: 100.0000 mg | Freq: Every day | INTRAMUSCULAR | Status: DC

## 2019-09-19 MED ORDER — FOLIC ACID 1 MG PO TABS
1.0000 mg | ORAL_TABLET | Freq: Every day | ORAL | Status: DC
  Administered 2019-09-19 – 2019-09-21 (×3): 1 mg via ORAL
  Filled 2019-09-19 (×3): qty 1

## 2019-09-19 MED ORDER — LORAZEPAM 2 MG/ML IJ SOLN: 0.0000 mg | Freq: Two times a day (BID) | INTRAMUSCULAR | Status: DC

## 2019-09-19 MED ORDER — TEMAZEPAM 15 MG PO CAPS: 15.0000 mg | ORAL_CAPSULE | Freq: Every evening | ORAL | Status: DC | PRN

## 2019-09-19 MED ORDER — METOPROLOL TARTRATE 25 MG PO TABS
25.0000 mg | ORAL_TABLET | Freq: Two times a day (BID) | ORAL | Status: DC
  Administered 2019-09-19 – 2019-09-21 (×4): 25 mg via ORAL
  Filled 2019-09-19 (×4): qty 1

## 2019-09-19 MED ORDER — FENTANYL CITRATE (PF) 100 MCG/2ML IJ SOLN
100.0000 ug | Freq: Once | INTRAMUSCULAR | Status: AC
  Administered 2019-09-19: 04:00:00 100 ug via INTRAVENOUS
  Filled 2019-09-19: qty 2

## 2019-09-19 MED ORDER — METHOCARBAMOL 500 MG PO TABS: 500.0000 mg | ORAL_TABLET | Freq: Three times a day (TID) | ORAL | Status: DC | PRN

## 2019-09-19 MED ORDER — LORAZEPAM 1 MG PO TABS: 1.0000 mg | ORAL_TABLET | ORAL | Status: DC | PRN

## 2019-09-19 MED ORDER — ALUM & MAG HYDROXIDE-SIMETH 200-200-20 MG/5ML PO SUSP
30.0000 mL | ORAL | Status: DC | PRN
  Administered 2019-09-19: 18:00:00 30 mL via ORAL
  Filled 2019-09-19: qty 30

## 2019-09-19 MED ORDER — SODIUM CHLORIDE 0.9 % IV SOLN: INTRAVENOUS | Status: DC

## 2019-09-19 MED ORDER — ACETAMINOPHEN 325 MG PO TABS: 650.0000 mg | ORAL_TABLET | Freq: Four times a day (QID) | ORAL | Status: DC | PRN

## 2019-09-19 MED ORDER — TRAMADOL HCL 50 MG PO TABS
100.0000 mg | ORAL_TABLET | Freq: Two times a day (BID) | ORAL | Status: DC | PRN
  Administered 2019-09-19: 18:00:00 100 mg via ORAL
  Filled 2019-09-19: qty 2

## 2019-09-19 NOTE — Progress Notes (Signed)
PROGRESS NOTE    [name]  [id number] DOB: 1955-11-11 DOA: 09/18/2019 PCP: Josetta Huddle, MD   Brief Narrative: [name] is a 64 y.o. male, w gerd, hx of diverticulitis , bowel abscess with perforation and repair s/p colostomy, s/p ileostomy and revision of both, ventral hernia repair, s/p bilateral hip replacement. Patient presented secondary to an abnormal lab of high calcium.   Assessment & Plan:   Principal Problem:   Hypercalcemia Active Problems:   Anemia   ARF (acute renal failure) (HCC)   Thrombocytopenia (HCC)   Hypercalcemia Patient presented with an undetectably high calcium level. Unknown etiology. No known malignancy. Patient's albumin is normal which is elevated from previous baseline of 2 in 2018. Possible patient is severely dehydrated which could be contributing to hypercalemia. Vitamin D 25 level is normal; patient reports being taken off of vitamin d supplementation about three months ago. -Continue IV fluids -Follow-up SPEP, UPEP, PTH, PTHrp, urine cytology -Ionized calcium  AKI Baseline creatinine from 2018 of 1.0. Creatinine of 4.48 on admission. Some mild improvement to date. -Continue IV fluids  Alcoholic cirrhosis No mention of ascites on imaging. Patient states he continues to consume alcohol at low volumes. He has two drinks per day which include liquor. History of coagulopathy with previous INR of 1.58 in 2018. Normal albumin. Elevated bilirubin. -Check PT/INR -CIWA  Chronic anemia Macrocytic. Likely related to alcohol use. Stable. -Folate, Vitamin B12 -Iron, TIBC, ferritin  Thrombocytopenia Likely related to liver disease. Stable. No bleeding but does have some bruising.   DVT prophylaxis: SCDs Code Status:   Code Status: Full Code Family Communication: None at bedside Disposition Plan: Discharge pending improvement of hypercalcemia and kidney injury   Consultants:   None  Procedures:   None  Antimicrobials:  None      Subjective: Patient feels a little better from earlier.  Objective: Vitals:   09/19/19 0130 09/19/19 0300 09/19/19 0341 09/19/19 0500  BP: 119/61 121/63 127/70 133/71  Pulse: 61 67 62 71  Resp: 20 19 14 18   Temp:      TempSrc:      SpO2: 98% 99% 99% 98%  Weight:      Height:        Intake/Output Summary (Last 24 hours) at 09/19/2019 0659 Last data filed at 09/19/2019 0255 Gross per 24 hour  Intake 1000 ml  Output --  Net 1000 ml   Filed Weights   09/18/19 2350  Weight: 65.8 kg    Examination:  General exam: Appears calm and comfortable Respiratory system: Clear to auscultation. Respiratory effort normal. Cardiovascular system: S1 & S2 heard, RRR. No murmurs, rubs, gallops or clicks. Gastrointestinal system: Abdomen is slightly distended, soft and nontender. Normal bowel sounds heard. Central nervous system: Alert and oriented. No focal neurological deficits. Extremities: No edema. No calf tenderness Skin: No cyanosis. No rashes Psychiatry: Judgement and insight appear normal. Mood & affect appropriate.     Data Reviewed: I have personally reviewed following labs and imaging studies  CBC: Recent Labs  Lab 09/19/19 0014 09/19/19 0500  WBC 4.9 4.0  NEUTROABS 3.1  --   HGB 11.7* 10.7*  HCT 34.1* 30.8*  MCV 104.6* 104.8*  PLT 40* 38*   Basic Metabolic Panel: Recent Labs  Lab 09/19/19 0014 09/19/19 0500  NA 137 139  K 3.8 4.2  CL 98 103  CO2 24 25  GLUCOSE 98 87  BUN 68* 63*  CREATININE 4.48* 4.11*  CALCIUM >15.0* 14.0*  MG 1.7  1.5*  PHOS  --  4.6   GFR: Estimated Creatinine Clearance: 16.7 mL/min (A) (by C-G formula based on SCr of 4.11 mg/dL (H)). Liver Function Tests: Recent Labs  Lab 09/19/19 0014 09/19/19 0500  AST 31 33  ALT 21 19  ALKPHOS 76 65  BILITOT 3.9* 2.9*  PROT 8.4* 7.4  ALBUMIN 4.3 3.5   No results for input(s): LIPASE, AMYLASE in the last 168 hours. No results for input(s): AMMONIA in the last 168 hours. Coagulation  Profile: No results for input(s): INR, PROTIME in the last 168 hours. Cardiac Enzymes: No results for input(s): CKTOTAL, CKMB, CKMBINDEX, TROPONINI in the last 168 hours. BNP (last 3 results) No results for input(s): PROBNP in the last 8760 hours. HbA1C: No results for input(s): HGBA1C in the last 72 hours. CBG: No results for input(s): GLUCAP in the last 168 hours. Lipid Profile: No results for input(s): CHOL, HDL, LDLCALC, TRIG, CHOLHDL, LDLDIRECT in the last 72 hours. Thyroid Function Tests: Recent Labs    09/19/19 0500  TSH 0.432   Anemia Panel: No results for input(s): VITAMINB12, FOLATE, FERRITIN, TIBC, IRON, RETICCTPCT in the last 72 hours. Sepsis Labs: No results for input(s): PROCALCITON, LATICACIDVEN in the last 168 hours.  Recent Results (from the past 240 hour(s))  Respiratory Panel by RT PCR (Flu A&B, Covid) - Nasopharyngeal Swab     Status: None   Collection Time: 09/19/19  3:04 AM   Specimen: Nasopharyngeal Swab  Result Value Ref Range Status   SARS Coronavirus 2 by RT PCR NEGATIVE NEGATIVE Final    Comment: (NOTE) SARS-CoV-2 target nucleic acids are NOT DETECTED. The SARS-CoV-2 RNA is generally detectable in upper respiratoy specimens during the acute phase of infection. The lowest concentration of SARS-CoV-2 viral copies this assay can detect is 131 copies/mL. A negative result does not preclude SARS-Cov-2 infection and should not be used as the sole basis for treatment or other patient management decisions. A negative result may occur with  improper specimen collection/handling, submission of specimen other than nasopharyngeal swab, presence of viral mutation(s) within the areas targeted by this assay, and inadequate number of viral copies (<131 copies/mL). A negative result must be combined with clinical observations, patient history, and epidemiological information. The expected result is Negative. Fact Sheet for Patients:    PinkCheek.be Fact Sheet for Healthcare Providers:  GravelBags.it This test is not yet ap proved or cleared by the [place] FDA and  has been authorized for detection and/or diagnosis of SARS-CoV-2 by FDA under an Emergency Use Authorization (EUA). This EUA will remain  in effect (meaning this test can be used) for the duration of the COVID-19 declaration under Section 564(b)(1) of the Act, 21 U.S.C. section 360bbb-3(b)(1), unless the authorization is terminated or revoked sooner.    Influenza A by PCR NEGATIVE NEGATIVE Final   Influenza B by PCR NEGATIVE NEGATIVE Final    Comment: (NOTE) The Xpert Xpress SARS-CoV-2/FLU/RSV assay is intended as an aid in  the diagnosis of influenza from Nasopharyngeal swab specimens and  should not be used as a sole basis for treatment. Nasal washings and  aspirates are unacceptable for Xpert Xpress SARS-CoV-2/FLU/RSV  testing. Fact Sheet for Patients: PinkCheek.be Fact Sheet for Healthcare Providers: GravelBags.it This test is not yet approved or cleared by the [place] FDA and  has been authorized for detection and/or diagnosis of SARS-CoV-2 by  FDA under an Emergency Use Authorization (EUA). This EUA will remain  in effect (meaning this test can be used)  for the duration of the  Covid-19 declaration under Section 564(b)(1) of the Act, 21  U.S.C. section 360bbb-3(b)(1), unless the authorization is  terminated or revoked. Performed at [facility], [place] [address], [place], [place] [address]          Radiology Studies: CT Renal Stone Study  Result Date: 09/19/2019 CLINICAL DATA:  Flank pain. EXAM: CT ABDOMEN AND PELVIS WITHOUT CONTRAST TECHNIQUE: Multidetector CT imaging of the abdomen and pelvis was performed following the standard protocol without IV contrast. COMPARISON:  CT dated October 16, 2017.  FINDINGS: Lower chest: There is atelectasis at the lung bases.The heart size is normal. Hepatobiliary: Again noted is atrophy of the left hepatic lobe. There is evidence for portal hypertension with perhaps underlying cirrhosis. Normal gallbladder.There is no biliary ductal dilation. Pancreas: There are multiple calcifications in the pancreas likely related to chronic pancreatitis. Spleen: The spleen is significantly enlarged measuring approximately 16.2 cm craniocaudad. Adrenals/Urinary Tract: --Adrenal glands: No adrenal hemorrhage. --Right kidney/ureter: There are nonobstructing stones throughout the right kidney. No evidence for right-sided hydronephrosis. Multiple simple appearing cysts are noted throughout the right kidney. There is a 1.3 cm hyperdense nodule in the interpolar region that is incompletely characterized on this exam (axial series 2, image 23). This appears to represent a hemorrhagic or proteinaceous cyst as it is stable from prior CT in 2019. --Left kidney/ureter: Multiple nonobstructing stones are noted throughout the left kidney. There is a stable complex cyst in the interpolar region measuring approximately 2.7 cm. There is a 4 cm complex cyst arising from the lower pole the left kidney (axial series 2, image 37). This is stable from prior study. --Urinary bladder: The urinary bladder is not well evaluated secondary to extensive streak artifact through the patient's pelvis. Stomach/Bowel: --Stomach/Duodenum: No hiatal hernia or other gastric abnormality. Normal duodenal course and caliber. --Small bowel: No dilatation or inflammation. --Colon: There is a surgical anastomosis at the level rectal sigmoid junction. The patient is likely status post partial colectomy. --Appendix: The patient appears to be status post prior appendectomy. Vascular/Lymphatic: Atherosclerotic calcification is present within the non-aneurysmal abdominal aorta, without hemodynamically significant stenosis. Portal  hypertension with portosystemic shunting is noted. There is a large splenorenal shunt. --No retroperitoneal lymphadenopathy. --No mesenteric lymphadenopathy. --No pelvic or inguinal lymphadenopathy. Reproductive: The prostate gland is enlarged. Other: No ascites or free air. The abdominal wall is normal. Musculoskeletal. There are bilateral total hip arthroplasties. The hardware appears grossly intact where visualized. There is some lucency about the femoral component of the right hip prosthesis which is similar across prior studies. There is no acute displaced fracture. IMPRESSION: 1. No acute abnormality. 2. Bilateral nonobstructing nephrolithiasis. 3. Stigmata of portal hypertension including splenomegaly and portosystemic shunting as detailed above. 4. Again noted is atrophy of the left hepatic lobe. This is stable across prior studies. 5. Additional chronic findings as detailed above. Aortic Atherosclerosis (ICD10-I70.0). Electronically Signed   By: Constance Holster M.D.   On: 09/19/2019 03:04        Scheduled Meds: . folic acid  1 mg Oral Daily  . LORazepam  0-4 mg Intravenous Q6H   Followed by  . [START ON 09/21/2019] LORazepam  0-4 mg Intravenous Q12H  . multivitamin with minerals  1 tablet Oral Daily  . thiamine  100 mg Oral Daily   Or  . thiamine  100 mg Intravenous Daily   Continuous Infusions: . sodium chloride 150 mL/hr at 09/19/19 0511     LOS: 0 days  Cordelia Poche, MD [facility] 09/19/2019, 6:59 AM  If 7PM-7AM, please contact night-coverage [email or web address]

## 2019-09-19 NOTE — ED Notes (Signed)
Patient given breakfast tray and repositioned. Reminded to notify this RN when he has his first urine sample ready.

## 2019-09-19 NOTE — ED Notes (Signed)
Date and time results received: 09/19/19 0045 (use smartphrase ".now" to insert current time)  Test: Calcium Critical Value: >15.0  Name of Provider Notified: [place], MD  Orders Received? Or Actions Taken?: Orders Received - See Orders for details

## 2019-09-19 NOTE — Progress Notes (Signed)
  Echocardiogram 2D Echocardiogram has been performed.  Kaylyn Garrow G Zakai Gonyea 09/19/2019, 9:20 AM

## 2019-09-19 NOTE — ED Notes (Signed)
Pt transported to CT ?
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
## 2019-09-19 NOTE — Progress Notes (Signed)
Patient states he drinks approximately 1-2 white russians per day.

## 2019-09-20 LAB — CALCIUM, IONIZED: Calcium, Ionized, Serum: 7.8 mg/dL — ABNORMAL HIGH (ref 4.5–5.6)

## 2019-09-20 LAB — CBC
HCT: 29 % — ABNORMAL LOW (ref 39.0–52.0)
Hemoglobin: 9.9 g/dL — ABNORMAL LOW (ref 13.0–17.0)
MCH: 36 pg — ABNORMAL HIGH (ref 26.0–34.0)
MCHC: 34.1 g/dL (ref 30.0–36.0)
MCV: 105.5 fL — ABNORMAL HIGH (ref 80.0–100.0)
Platelets: 36 10*3/uL — ABNORMAL LOW (ref 150–400)
RBC: 2.75 MIL/uL — ABNORMAL LOW (ref 4.22–5.81)
RDW: 14.4 % (ref 11.5–15.5)
WBC: 4.5 10*3/uL (ref 4.0–10.5)
nRBC: 0 % (ref 0.0–0.2)

## 2019-09-20 LAB — RENAL FUNCTION PANEL
Albumin: 3.4 g/dL — ABNORMAL LOW (ref 3.5–5.0)
Anion gap: 8 (ref 5–15)
BUN: 57 mg/dL — ABNORMAL HIGH (ref 8–23)
CO2: 24 mmol/L (ref 22–32)
Calcium: 12.3 mg/dL — ABNORMAL HIGH (ref 8.9–10.3)
Chloride: 108 mmol/L (ref 98–111)
Creatinine, Ser: 3.06 mg/dL — ABNORMAL HIGH (ref 0.61–1.24)
GFR calc Af Amer: 24 mL/min — ABNORMAL LOW (ref >60)
GFR calc non Af Amer: 20 mL/min — ABNORMAL LOW (ref >60)
Glucose, Bld: 89 mg/dL (ref 70–99)
Phosphorus: 2.5 mg/dL (ref 2.5–4.6)
Potassium: 4.1 mmol/L (ref 3.5–5.1)
Sodium: 140 mmol/L (ref 135–145)

## 2019-09-20 LAB — URINE CULTURE: Culture: NO GROWTH

## 2019-09-20 LAB — PARATHYROID HORMONE, INTACT (NO CA): PTH: 19 pg/mL (ref 15–65)

## 2019-09-20 NOTE — Progress Notes (Signed)
PROGRESS NOTE    UTAH Wilson  [id number] DOB: [birth year] DOA: 09/18/2019 PCP: Brandon Huddle, MD   Brief Narrative: [name] is a 64 y.o. male, w gerd, hx of diverticulitis , bowel abscess with perforation and repair s/p colostomy, s/p ileostomy and revision of both, ventral hernia repair, s/p bilateral hip replacement. Patient presented secondary to an abnormal lab of high calcium.   Assessment & Plan:   Principal Problem:   Hypercalcemia Active Problems:   Anemia   ARF (acute renal failure) (HCC)   Thrombocytopenia (HCC)   Hypercalcemia Patient presented with an undetectably high calcium level. Unknown etiology. No known malignancy. Patient's albumin is normal which is elevated from previous baseline of 2 in 2018. Possible patient is severely dehydrated which could be contributing to hypercalemia. Vitamin D 25 level is normal; patient reports being taken off of vitamin d supplementation about three months ago. PTH in low-normal range. Calcium improving slowly on IV fluids -Continue IV fluids -Follow-up SPEP, UPEP, PTHrp, urine cytology -Ionized calcium (2/5) pending  AKI Baseline creatinine from 2018 of 1.0. Creatinine of 4.48 on admission. Improving well. UOP is somewhat low. -Continue IV fluids -Strict in/out  Alcoholic cirrhosis No mention of ascites on imaging. Patient states he continues to consume alcohol at low volumes. He has two drinks per day which include liquor. History of coagulopathy with previous INR of 1.58 in 2018. Normal albumin. Elevated bilirubin. Elevated INR of 1.4. -CIWA  Chronic anemia Macrocytic. Likely related to alcohol use. Stable. -Folate, Vitamin B12 -Iron, TIBC, ferritin  Thrombocytopenia Likely related to liver disease. Stable. No bleeding but does have some bruising.   DVT prophylaxis: SCDs Code Status:   Code Status: Full Code Family Communication: None at bedside Disposition Plan: Discharge pending improvement of  hypercalcemia and kidney injury   Consultants:   None  Procedures:   TRANSTHORACIC ECHOCARDIOGRAM (09/20/2019) IMPRESSIONS    1. Left ventricular ejection fraction, by visual estimation, is 60 to  65%. The left ventricle has normal function. There is no left ventricular  hypertrophy.  2. Left ventricular diastolic parameters are indeterminate.  3. Global right ventricle has normal systolic function.The right  ventricular size is normal.  4. Left atrial size was normal.  5. Right atrial size was normal.  6. The mitral valve is normal in structure. Trivial mitral valve  regurgitation.  7. The tricuspid valve is normal in structure. Tricuspid valve  regurgitation is not demonstrated.  8. The aortic valve was not well visualized. Aortic valve regurgitation  is not visualized. Mild to moderate aortic valve sclerosis/calcification  without any evidence of aortic stenosis.  9. The pulmonic valve was not well visualized. Pulmonic valve  regurgitation is not visualized.  10. TR signal is inadequate for assessing pulmonary artery systolic  pressure.  11. The inferior vena cava is dilated in size with >50% respiratory  variability, suggesting right atrial pressure of 8 mmHg.   Antimicrobials:  None    Subjective: No issues overnight. No dyspnea, abdominal swelling, leg swelling.  Objective: Vitals:   09/19/19 1956 09/19/19 2306 09/20/19 0222 09/20/19 0500  BP: 126/65 133/65 131/74   Pulse: 70 70 66   Resp: 18  18   Temp: 98.4 F (36.9 C)  98 F (36.7 C)   TempSrc: Oral  Oral   SpO2: 93%  96%   Weight:    68.4 kg  Height:        Intake/Output Summary (Last 24 hours) at 09/20/2019 [id number] Last data filed at  09/20/2019 [id number] Gross per 24 hour  Intake 1588.65 ml  Output 850 ml  Net 738.65 ml   Filed Weights   09/18/19 2350 09/20/19 0500  Weight: 65.8 kg 68.4 kg    Examination:  General exam: Appears calm and comfortable Respiratory system: Clear to  auscultation. Respiratory effort normal. Cardiovascular system: S1 & S2 heard, RRR. No murmurs, rubs, gallops or clicks. Gastrointestinal system: Abdomen is distended, soft and nontender. Normal bowel sounds heard. Central nervous system: Alert and oriented. No focal neurological deficits. Extremities: No edema. No calf tenderness Skin: No cyanosis. No rashes Psychiatry: Judgement and insight appear normal. Mood & affect appropriate.    Data Reviewed: I have personally reviewed following labs and imaging studies  CBC: Recent Labs  Lab 09/19/19 0014 09/19/19 0500 09/20/19 0240  WBC 4.9 4.0 4.5  NEUTROABS 3.1  --   --   HGB 11.7* 10.7* 9.9*  HCT 34.1* 30.8* 29.0*  MCV 104.6* 104.8* 105.5*  PLT 40* 38* 36*   Basic Metabolic Panel: Recent Labs  Lab 09/19/19 0014 09/19/19 0500 09/19/19 1400 09/20/19 0240  NA 137 139 139 140  K 3.8 4.2 4.0 4.1  CL 98 103 104 108  CO2 24 25 27 24   GLUCOSE 98 87 93 89  BUN 68* 63* 61* 57*  CREATININE 4.48* 4.11* 3.87* 3.06*  CALCIUM >15.0* 14.0* 13.0* 12.3*  MG 1.7 1.5*  --   --   PHOS  --  4.6 4.2 2.5   GFR: Estimated Creatinine Clearance: 23.3 mL/min (A) (by C-G formula based on SCr of 3.06 mg/dL (H)). Liver Function Tests: Recent Labs  Lab 09/19/19 0014 09/19/19 0500 09/19/19 1400 09/20/19 0240  AST 31 33  --   --   ALT 21 19  --   --   ALKPHOS 76 65  --   --   BILITOT 3.9* 2.9*  --   --   PROT 8.4* 7.4  --   --   ALBUMIN 4.3 3.5 3.7 3.4*   No results for input(s): LIPASE, AMYLASE in the last 168 hours. No results for input(s): AMMONIA in the last 168 hours. Coagulation Profile: Recent Labs  Lab 09/19/19 0730  INR 1.4*   Cardiac Enzymes: No results for input(s): CKTOTAL, CKMB, CKMBINDEX, TROPONINI in the last 168 hours. BNP (last 3 results) No results for input(s): PROBNP in the last 8760 hours. HbA1C: No results for input(s): HGBA1C in the last 72 hours. CBG: No results for input(s): GLUCAP in the last 168  hours. Lipid Profile: No results for input(s): CHOL, HDL, LDLCALC, TRIG, CHOLHDL, LDLDIRECT in the last 72 hours. Thyroid Function Tests: Recent Labs    09/19/19 0500  TSH 0.432   Anemia Panel: Recent Labs    09/19/19 0708 09/19/19 1400  VITAMINB12  --  1,338*  FOLATE  --  11.5  FERRITIN 187  --   TIBC 333  --   IRON 135  --    Sepsis Labs: No results for input(s): PROCALCITON, LATICACIDVEN in the last 168 hours.  Recent Results (from the past 240 hour(s))  Respiratory Panel by RT PCR (Flu A&B, Covid) - Nasopharyngeal Swab     Status: None   Collection Time: 09/19/19  3:04 AM   Specimen: Nasopharyngeal Swab  Result Value Ref Range Status   SARS Coronavirus 2 by RT PCR NEGATIVE NEGATIVE Final    Comment: (NOTE) SARS-CoV-2 target nucleic acids are NOT DETECTED. The SARS-CoV-2 RNA is generally detectable in upper respiratoy specimens during the acute  phase of infection. The lowest concentration of SARS-CoV-2 viral copies this assay can detect is 131 copies/mL. A negative result does not preclude SARS-Cov-2 infection and should not be used as the sole basis for treatment or other patient management decisions. A negative result may occur with  improper specimen collection/handling, submission of specimen other than nasopharyngeal swab, presence of viral mutation(s) within the areas targeted by this assay, and inadequate number of viral copies (<131 copies/mL). A negative result must be combined with clinical observations, patient history, and epidemiological information. The expected result is Negative. Fact Sheet for Patients:  PinkCheek.be Fact Sheet for Healthcare Providers:  GravelBags.it This test is not yet ap proved or cleared by the [place] FDA and  has been authorized for detection and/or diagnosis of SARS-CoV-2 by FDA under an Emergency Use Authorization (EUA). This EUA will remain  in effect  (meaning this test can be used) for the duration of the COVID-19 declaration under Section 564(b)(1) of the Act, 21 U.S.C. section 360bbb-3(b)(1), unless the authorization is terminated or revoked sooner.    Influenza A by PCR NEGATIVE NEGATIVE Final   Influenza B by PCR NEGATIVE NEGATIVE Final    Comment: (NOTE) The Xpert Xpress SARS-CoV-2/FLU/RSV assay is intended as an aid in  the diagnosis of influenza from Nasopharyngeal swab specimens and  should not be used as a sole basis for treatment. Nasal washings and  aspirates are unacceptable for Xpert Xpress SARS-CoV-2/FLU/RSV  testing. Fact Sheet for Patients: PinkCheek.be Fact Sheet for Healthcare Providers: GravelBags.it This test is not yet approved or cleared by the [place] FDA and  has been authorized for detection and/or diagnosis of SARS-CoV-2 by  FDA under an Emergency Use Authorization (EUA). This EUA will remain  in effect (meaning this test can be used) for the duration of the  Covid-19 declaration under Section 564(b)(1) of the Act, 21  U.S.C. section 360bbb-3(b)(1), unless the authorization is  terminated or revoked. Performed at [facility], [place] [address] [place], [place] [address]          Radiology Studies: ECHOCARDIOGRAM COMPLETE  Result Date: 09/19/2019   ECHOCARDIOGRAM REPORT   Patient Name:   ETHANMICHAEL FLORES Date of Exam: 09/19/2019 Medical Rec #:  [id number]     Height:       69.0 in Accession #:    [id number]    Weight:       145.0 lb Date of Birth:  [birth year]     BSA:          1.80 m Patient Age:    32 years      BP:           120/62 mmHg Patient Gender: M             HR:           60 bpm. Exam Location:  Inpatient Procedure: 2D Echo, Cardiac Doppler and Color Doppler Indications:    R01.1 Murmur  History:        Patient has prior history of Echocardiogram examinations, most                 recent 04/30/2017. Risk Factors:GERD.   Sonographer:    Jonelle Sidle Dance Referring Phys: [place]  1. Left ventricular ejection fraction, by visual estimation, is 60 to 65%. The left ventricle has normal function. There is no left ventricular hypertrophy.  2. Left ventricular diastolic parameters are indeterminate.  3. Global right ventricle has normal  systolic function.The right ventricular size is normal.  4. Left atrial size was normal.  5. Right atrial size was normal.  6. The mitral valve is normal in structure. Trivial mitral valve regurgitation.  7. The tricuspid valve is normal in structure. Tricuspid valve regurgitation is not demonstrated.  8. The aortic valve was not well visualized. Aortic valve regurgitation is not visualized. Mild to moderate aortic valve sclerosis/calcification without any evidence of aortic stenosis.  9. The pulmonic valve was not well visualized. Pulmonic valve regurgitation is not visualized. 10. TR signal is inadequate for assessing pulmonary artery systolic pressure. 11. The inferior vena cava is dilated in size with >50% respiratory variability, suggesting right atrial pressure of 8 mmHg. FINDINGS  Left Ventricle: Left ventricular ejection fraction, by visual estimation, is 60 to 65%. The left ventricle has normal function. The left ventricle has no regional wall motion abnormalities. The left ventricular internal cavity size was the left ventricle is normal in size. There is no left ventricular hypertrophy. Left ventricular diastolic parameters are indeterminate. Right Ventricle: The right ventricular size is normal. No increase in right ventricular wall thickness. Global RV systolic function is has normal systolic function. Left Atrium: Left atrial size was normal in size. Right Atrium: Right atrial size was normal in size Pericardium: Trivial pericardial effusion is present. Mitral Valve: The mitral valve is normal in structure. Trivial mitral valve regurgitation. Tricuspid Valve: The tricuspid valve  is normal in structure. Tricuspid valve regurgitation is not demonstrated. Aortic Valve: The aortic valve was not well visualized. Aortic valve regurgitation is not visualized. Mild to moderate aortic valve sclerosis/calcification is present, without any evidence of aortic stenosis. Pulmonic Valve: The pulmonic valve was not well visualized. Pulmonic valve regurgitation is not visualized. Pulmonic regurgitation is not visualized. Aorta: The aortic root and ascending aorta are structurally normal, with no evidence of dilitation. Venous: The inferior vena cava is dilated in size with greater than 50% respiratory variability, suggesting right atrial pressure of 8 mmHg. IAS/Shunts: The atrial septum is grossly normal.  LEFT VENTRICLE PLAX 2D LVIDd:         5.60 cm  Diastology LVIDs:         3.70 cm  LV e' lateral:   8.16 cm/s LV PW:         1.00 cm  LV E/e' lateral: 9.2 LV IVS:        0.80 cm  LV e' medial:    6.31 cm/s LVOT diam:     2.20 cm  LV E/e' medial:  11.9 LV SV:         96 ml LV SV Index:   53.46 LVOT Area:     3.80 cm  RIGHT VENTRICLE             IVC RV Basal diam:  2.80 cm     IVC diam: 1.90 cm RV S prime:     15.30 cm/s TAPSE (M-mode): 2.6 cm LEFT ATRIUM             Index       RIGHT ATRIUM           Index LA diam:        3.90 cm 2.16 cm/m  RA Area:     17.80 cm LA Vol (A2C):   68.3 ml 37.90 ml/m RA Volume:   45.00 ml  24.97 ml/m LA Vol (A4C):   40.7 ml 22.58 ml/m LA Biplane Vol: 54.9 ml 30.46 ml/m  AORTIC VALVE LVOT Vmax:  90.00 cm/s LVOT Vmean:  57.900 cm/s LVOT VTI:    0.203 m  AORTA Ao Root diam: 4.20 cm Ao Asc diam:  3.30 cm MITRAL VALVE MV Area (PHT): 2.20 cm             SHUNTS MV PHT:        100.05 msec          Systemic VTI:  0.20 m MV Decel Time: 345 msec             Systemic Diam: 2.20 cm MV E velocity: 74.90 cm/s 103 cm/s MV A velocity: 80.40 cm/s 70.3 cm/s MV E/A ratio:  0.93       1.5  Oswaldo Milian MD Electronically signed by Oswaldo Milian MD Signature Date/Time:  09/19/2019/12:30:25 PM    Final    CT Renal Stone Study  Result Date: 09/19/2019 CLINICAL DATA:  Flank pain. EXAM: CT ABDOMEN AND PELVIS WITHOUT CONTRAST TECHNIQUE: Multidetector CT imaging of the abdomen and pelvis was performed following the standard protocol without IV contrast. COMPARISON:  CT dated October 16, 2017. FINDINGS: Lower chest: There is atelectasis at the lung bases.The heart size is normal. Hepatobiliary: Again noted is atrophy of the left hepatic lobe. There is evidence for portal hypertension with perhaps underlying cirrhosis. Normal gallbladder.There is no biliary ductal dilation. Pancreas: There are multiple calcifications in the pancreas likely related to chronic pancreatitis. Spleen: The spleen is significantly enlarged measuring approximately 16.2 cm craniocaudad. Adrenals/Urinary Tract: --Adrenal glands: No adrenal hemorrhage. --Right kidney/ureter: There are nonobstructing stones throughout the right kidney. No evidence for right-sided hydronephrosis. Multiple simple appearing cysts are noted throughout the right kidney. There is a 1.3 cm hyperdense nodule in the interpolar region that is incompletely characterized on this exam (axial series 2, image 23). This appears to represent a hemorrhagic or proteinaceous cyst as it is stable from prior CT in 2019. --Left kidney/ureter: Multiple nonobstructing stones are noted throughout the left kidney. There is a stable complex cyst in the interpolar region measuring approximately 2.7 cm. There is a 4 cm complex cyst arising from the lower pole the left kidney (axial series 2, image 37). This is stable from prior study. --Urinary bladder: The urinary bladder is not well evaluated secondary to extensive streak artifact through the patient's pelvis. Stomach/Bowel: --Stomach/Duodenum: No hiatal hernia or other gastric abnormality. Normal duodenal course and caliber. --Small bowel: No dilatation or inflammation. --Colon: There is a surgical anastomosis at  the level rectal sigmoid junction. The patient is likely status post partial colectomy. --Appendix: The patient appears to be status post prior appendectomy. Vascular/Lymphatic: Atherosclerotic calcification is present within the non-aneurysmal abdominal aorta, without hemodynamically significant stenosis. Portal hypertension with portosystemic shunting is noted. There is a large splenorenal shunt. --No retroperitoneal lymphadenopathy. --No mesenteric lymphadenopathy. --No pelvic or inguinal lymphadenopathy. Reproductive: The prostate gland is enlarged. Other: No ascites or free air. The abdominal wall is normal. Musculoskeletal. There are bilateral total hip arthroplasties. The hardware appears grossly intact where visualized. There is some lucency about the femoral component of the right hip prosthesis which is similar across prior studies. There is no acute displaced fracture. IMPRESSION: 1. No acute abnormality. 2. Bilateral nonobstructing nephrolithiasis. 3. Stigmata of portal hypertension including splenomegaly and portosystemic shunting as detailed above. 4. Again noted is atrophy of the left hepatic lobe. This is stable across prior studies. 5. Additional chronic findings as detailed above. Aortic Atherosclerosis (ICD10-I70.0). Electronically Signed   By: Constance Holster M.D.   On: 09/19/2019 03:04  Scheduled Meds: . folic acid  1 mg Oral Daily  . LORazepam  0-4 mg Intravenous Q6H   Followed by  . [START ON 09/21/2019] LORazepam  0-4 mg Intravenous Q12H  . metoprolol tartrate  25 mg Oral BID  . multivitamin with minerals  1 tablet Oral Daily  . thiamine  100 mg Oral Daily   Or  . thiamine  100 mg Intravenous Daily   Continuous Infusions: . sodium chloride 150 mL/hr at 09/20/19 0806     LOS: 1 day     Cordelia Poche, MD [facility] 09/20/2019, 8:58 AM  If 7PM-7AM, please contact night-coverage [email or web address]

## 2019-09-21 LAB — RENAL FUNCTION PANEL
Albumin: 3.2 g/dL — ABNORMAL LOW (ref 3.5–5.0)
Anion gap: 6 (ref 5–15)
BUN: 46 mg/dL — ABNORMAL HIGH (ref 8–23)
CO2: 21 mmol/L — ABNORMAL LOW (ref 22–32)
Calcium: 10.8 mg/dL — ABNORMAL HIGH (ref 8.9–10.3)
Chloride: 116 mmol/L — ABNORMAL HIGH (ref 98–111)
Creatinine, Ser: 2.29 mg/dL — ABNORMAL HIGH (ref 0.61–1.24)
GFR calc Af Amer: 33 mL/min — ABNORMAL LOW (ref >60)
GFR calc non Af Amer: 29 mL/min — ABNORMAL LOW (ref >60)
Glucose, Bld: 89 mg/dL (ref 70–99)
Phosphorus: 1.4 mg/dL — ABNORMAL LOW (ref 2.5–4.6)
Potassium: 4.2 mmol/L (ref 3.5–5.1)
Sodium: 143 mmol/L (ref 135–145)

## 2019-09-21 LAB — CBC
HCT: 27.3 % — ABNORMAL LOW (ref 39.0–52.0)
Hemoglobin: 9.1 g/dL — ABNORMAL LOW (ref 13.0–17.0)
MCH: 36 pg — ABNORMAL HIGH (ref 26.0–34.0)
MCHC: 33.3 g/dL (ref 30.0–36.0)
MCV: 107.9 fL — ABNORMAL HIGH (ref 80.0–100.0)
Platelets: 33 10*3/uL — ABNORMAL LOW (ref 150–400)
RBC: 2.53 MIL/uL — ABNORMAL LOW (ref 4.22–5.81)
RDW: 14.6 % (ref 11.5–15.5)
WBC: 4.3 10*3/uL (ref 4.0–10.5)
nRBC: 0 % (ref 0.0–0.2)

## 2019-09-21 LAB — MAGNESIUM: Magnesium: 1.1 mg/dL — ABNORMAL LOW (ref 1.7–2.4)

## 2019-09-21 LAB — CALCITRIOL (1,25 DI-OH VIT D): Vit D, 1,25-Dihydroxy: 18.2 pg/mL — ABNORMAL LOW (ref 19.9–79.3)

## 2019-09-21 MED ORDER — K PHOS MONO-SOD PHOS DI & MONO 155-852-130 MG PO TABS: 250.0000 mg | ORAL_TABLET | Freq: Three times a day (TID) | ORAL | 0 refills | Status: AC

## 2019-09-21 MED ORDER — TRAMADOL HCL 50 MG PO TABS: 100.0000 mg | ORAL_TABLET | Freq: Two times a day (BID) | ORAL | Status: DC | PRN

## 2019-09-21 MED ORDER — K PHOS MONO-SOD PHOS DI & MONO 155-852-130 MG PO TABS
250.0000 mg | ORAL_TABLET | Freq: Three times a day (TID) | ORAL | Status: DC
  Administered 2019-09-21: 250 mg via ORAL
  Filled 2019-09-21 (×2): qty 1

## 2019-09-21 NOTE — Discharge Instructions (Signed)
Cheryl Flash,  You are in the hospital because of hypercalcemia (high calcium level in your blood).  This was managed with IV fluids.  You are undergoing a work-up for the cause of the hypercalcemia, however this was not completed prior to discharge.  There are still some labs pending and your primary care physician will need to follow-up on this.  Please continue to stay very hydrated.  I am recommending that you hold your Lasix for now but if you notice that you are starting to retain fluid please call your primary care physician immediately.  You also had some kidney impairment.  This was probably mediated by some dehydration as your kidneys have been improving with IV fluids.  Lasix can also make you too dry which can also complicate your fluid status.  I am recommending for you to hold your Lasix until you see your primary care physician.  Something else we discussed was your liver disease.  This is chronic and likely worsening.  We discussed complete cessation of alcohol.  This will be important to spare your liver from further damage.  You are showing evidence of impaired ability to clot and stop bleeding which is most likely directly related to your liver disease.

## 2019-09-21 NOTE — Discharge Summary (Signed)
Physician Discharge Summary  [name] [id number] DOB: [birth year] DOA: 09/18/2019  PCP: Josetta Huddle, MD  Admit date: 09/18/2019 Discharge date: 09/21/2019  Admitted From: Home Disposition: Home  Recommendations for Outpatient Follow-up:  1. Follow up with PCP in 4 days 2. Please obtain BMP/CBC in 2 to 3 days 3. Please follow up on the following pending results: PTHrp, SPEP, UPEP, urine cytology  Home Health: None Equipment/Devices: None.  Patient has a walker at home.  Discharge Condition: Stable CODE STATUS: Full code Diet recommendation: Low-sodium diet  Brief/Interim Summary:  Admission HPI written by Jani Gravel, MD    HPI:   [name]  is a 64 y.o. male, w gerd, hx of diverticulitis , bowel abscess with perforation and repair s/p colostomy, s/p ileostomy and revision of both, ventral hernia repair, s/p bilateral hip replacement, c/o R flank pain x 12 days and was seen by pcp 09/18/19,  Pt was treated with robaxin 500mg  po tid, and tramadol 100mg  po tid prn, and prednisone 50mg  po qday x 2 days then ...10mg  po qday x2 days, and also given a shot of Toradol 60mg  im x1 while in office.    Pt notes taking Lasix for the past 5-6 weeks.  Pt is not taking spironolactone per his office note.   Pt notes taking Aleve for the past several days.   Pt was called today and told that he had ARF and told to go to ER  Pt drinking orange juice in the AM, this is a new habit.    In ED,  T 98.7, P 78 R 16, Bp 121/63  Pox 98% on RA Wt 65.8kg  CT abd/ pelvis IMPRESSION: 1. No acute abnormality. 2. Bilateral nonobstructing nephrolithiasis. 3. Stigmata of portal hypertension including splenomegaly and portosystemic shunting as detailed above. 4. Again noted is atrophy of the left hepatic lobe. This is stable across prior studies.   Hospital course:  Hypercalcemia Patient presented with an undetectably high calcium level. Unknown etiology. No known malignancy. Patient's  albumin is normal which is elevated from previous baseline of 2 in 2018. Possible patient is severely dehydrated which could be contributing to hypercalemia. Vitamin D 25 level is normal; patient reports being taken off of vitamin d supplementation about three months ago. PTH in low-normal range. Calcium improved with IV fluids and is 10.8 on discharge. Follow-up SPEP, UPEP, PTHrp, urine cytology asa n outpatient. Recommended continued hydration and close follow-up with PCP as patient is desiring to be discharged today. Patient will need repeat lab work in 2-3 days.  AKI Baseline creatinine from 2018 of 1.0. Creatinine of 4.48 on admission. improved to 2.29 prior to discharge. Difficult to know true baseline since last known creatinine was from almost three years ago. Patient recommended to discontinue Lasix until he sees his primary care physician.  Alcoholic cirrhosis No mention of ascites on imaging. Patient states he continues to consume alcohol at low volumes. He has two drinks per day which include liquor. History of coagulopathy with previous INR of 1.58 in 2018. Normal albumin. Elevated bilirubin. Elevated INR of 1.4. CIWA protocol while inpatient. Discussed complete alcohol cessation; patient reluctant.  Chronic anemia Macrocytic. Likely related to alcohol use. Trended down with IV fluids but previous baseline from 2019 was around 8-9. No evidence of bleeding.  Thrombocytopenia Likely related to liver disease. Stable. No bleeding but does have some bruising. Risks discussed.  Discharge Diagnoses:  Principal Problem:   Hypercalcemia Active Problems:   Anemia  ARF (acute renal failure) (HCC)   Thrombocytopenia ([place])    Discharge Instructions   Allergies as of 09/21/2019      Reactions   Triamterene-hctz Other (See Comments)   dizzy   Chantix [varenicline] Nausea And Vomiting   Codeine Nausea And Vomiting   If taken on empty stomach   Fluconazole Other (See Comments)    Blisters on lips   Sulfa Antibiotics Other (See Comments)   CAUSED BLISTERS IN THE MOUTH      Medication List    STOP taking these medications   furosemide 20 MG tablet Commonly known as: LASIX   furosemide 40 MG tablet Commonly known as: Lasix   hydrocortisone 25 MG suppository Commonly known as: ANUSOL-HC   ibuprofen 200 MG tablet Commonly known as: ADVIL   pantoprazole 40 MG tablet Commonly known as: PROTONIX   predniSONE 10 MG tablet Commonly known as: DELTASONE   spironolactone 100 MG tablet Commonly known as: Aldactone     TAKE these medications   acetaminophen 325 MG tablet Commonly known as: TYLENOL Take 325-650 mg by mouth every 6 (six) hours as needed (for pain or headaches).   cephALEXin 250 MG capsule Commonly known as: KEFLEX Take 250 mg by mouth daily.   ferrous sulfate 324 MG Tbec Take 324 mg by mouth See admin instructions. Monday- Thursday only   methocarbamol 500 MG tablet Commonly known as: ROBAXIN Take 500 mg by mouth 3 (three) times daily as needed for muscle spasms.   metoprolol tartrate 25 MG tablet Commonly known as: LOPRESSOR Take 25 mg by mouth 2 (two) times daily.   OVER THE COUNTER MEDICATION Take 1 tablet by mouth daily.   phosphorus 155-852-130 MG tablet Commonly known as: K PHOS NEUTRAL Take 1 tablet (250 mg total) by mouth 3 (three) times daily for 2 days.   temazepam 15 MG capsule Commonly known as: RESTORIL Take 15 mg by mouth at bedtime as needed for sleep.   traMADol 50 MG tablet Commonly known as: ULTRAM Take 2 tablets (100 mg total) by mouth every 12 (twelve) hours as needed for moderate pain. What changed: when to take this      Follow-up Information    Josetta Huddle, MD. Schedule an appointment as soon as possible for a visit in 4 day(s).   Specialty: Internal Medicine Why: Hospital follow-up.  Repeat lab work. Contact information: [address] [place] [address] [phone]            Allergies  Allergen Reactions  . Triamterene-Hctz Other (See Comments)    dizzy  . Chantix [Varenicline] Nausea And Vomiting  . Codeine Nausea And Vomiting    If taken on empty stomach  . Fluconazole Other (See Comments)    Blisters on lips  . Sulfa Antibiotics Other (See Comments)    CAUSED BLISTERS IN THE MOUTH    Consultations:  None   Procedures/Studies: ECHOCARDIOGRAM COMPLETE  Result Date: 09/19/2019   ECHOCARDIOGRAM REPORT   Patient Name:   DONAT GAMLIN Date of Exam: 09/19/2019 Medical Rec #:  [id number]     Height:       69.0 in Accession #:    [id number]    Weight:       145.0 lb Date of Birth:  1955-10-07     BSA:          1.80 m Patient Age:    9 years      BP:  120/62 mmHg Patient Gender: M             HR:           60 bpm. Exam Location:  Inpatient Procedure: 2D Echo, Cardiac Doppler and Color Doppler Indications:    R01.1 Murmur  History:        Patient has prior history of Echocardiogram examinations, most                 recent 04/30/2017. Risk Factors:GERD.  Sonographer:    Jonelle Sidle Dance Referring Phys: [place]  1. Left ventricular ejection fraction, by visual estimation, is 60 to 65%. The left ventricle has normal function. There is no left ventricular hypertrophy.  2. Left ventricular diastolic parameters are indeterminate.  3. Global right ventricle has normal systolic function.The right ventricular size is normal.  4. Left atrial size was normal.  5. Right atrial size was normal.  6. The mitral valve is normal in structure. Trivial mitral valve regurgitation.  7. The tricuspid valve is normal in structure. Tricuspid valve regurgitation is not demonstrated.  8. The aortic valve was not well visualized. Aortic valve regurgitation is not visualized. Mild to moderate aortic valve sclerosis/calcification without any evidence of aortic stenosis.  9. The pulmonic valve was not well visualized. Pulmonic valve regurgitation is not visualized. 10. TR  signal is inadequate for assessing pulmonary artery systolic pressure. 11. The inferior vena cava is dilated in size with >50% respiratory variability, suggesting right atrial pressure of 8 mmHg. FINDINGS  Left Ventricle: Left ventricular ejection fraction, by visual estimation, is 60 to 65%. The left ventricle has normal function. The left ventricle has no regional wall motion abnormalities. The left ventricular internal cavity size was the left ventricle is normal in size. There is no left ventricular hypertrophy. Left ventricular diastolic parameters are indeterminate. Right Ventricle: The right ventricular size is normal. No increase in right ventricular wall thickness. Global RV systolic function is has normal systolic function. Left Atrium: Left atrial size was normal in size. Right Atrium: Right atrial size was normal in size Pericardium: Trivial pericardial effusion is present. Mitral Valve: The mitral valve is normal in structure. Trivial mitral valve regurgitation. Tricuspid Valve: The tricuspid valve is normal in structure. Tricuspid valve regurgitation is not demonstrated. Aortic Valve: The aortic valve was not well visualized. Aortic valve regurgitation is not visualized. Mild to moderate aortic valve sclerosis/calcification is present, without any evidence of aortic stenosis. Pulmonic Valve: The pulmonic valve was not well visualized. Pulmonic valve regurgitation is not visualized. Pulmonic regurgitation is not visualized. Aorta: The aortic root and ascending aorta are structurally normal, with no evidence of dilitation. Venous: The inferior vena cava is dilated in size with greater than 50% respiratory variability, suggesting right atrial pressure of 8 mmHg. IAS/Shunts: The atrial septum is grossly normal.  LEFT VENTRICLE PLAX 2D LVIDd:         5.60 cm  Diastology LVIDs:         3.70 cm  LV e' lateral:   8.16 cm/s LV PW:         1.00 cm  LV E/e' lateral: 9.2 LV IVS:        0.80 cm  LV e' medial:     6.31 cm/s LVOT diam:     2.20 cm  LV E/e' medial:  11.9 LV SV:         96 ml LV SV Index:   53.46 LVOT Area:     3.80  cm  RIGHT VENTRICLE             IVC RV Basal diam:  2.80 cm     IVC diam: 1.90 cm RV S prime:     15.30 cm/s TAPSE (M-mode): 2.6 cm LEFT ATRIUM             Index       RIGHT ATRIUM           Index LA diam:        3.90 cm 2.16 cm/m  RA Area:     17.80 cm LA Vol (A2C):   68.3 ml 37.90 ml/m RA Volume:   45.00 ml  24.97 ml/m LA Vol (A4C):   40.7 ml 22.58 ml/m LA Biplane Vol: 54.9 ml 30.46 ml/m  AORTIC VALVE LVOT Vmax:   90.00 cm/s LVOT Vmean:  57.900 cm/s LVOT VTI:    0.203 m  AORTA Ao Root diam: 4.20 cm Ao Asc diam:  3.30 cm MITRAL VALVE MV Area (PHT): 2.20 cm             SHUNTS MV PHT:        100.05 msec          Systemic VTI:  0.20 m MV Decel Time: 345 msec             Systemic Diam: 2.20 cm MV E velocity: 74.90 cm/s 103 cm/s MV A velocity: 80.40 cm/s 70.3 cm/s MV E/A ratio:  0.93       1.5  Oswaldo Milian MD Electronically signed by Oswaldo Milian MD Signature Date/Time: 09/19/2019/12:30:25 PM    Final    CT Renal Stone Study  Result Date: 09/19/2019 CLINICAL DATA:  Flank pain. EXAM: CT ABDOMEN AND PELVIS WITHOUT CONTRAST TECHNIQUE: Multidetector CT imaging of the abdomen and pelvis was performed following the standard protocol without IV contrast. COMPARISON:  CT dated October 16, 2017. FINDINGS: Lower chest: There is atelectasis at the lung bases.The heart size is normal. Hepatobiliary: Again noted is atrophy of the left hepatic lobe. There is evidence for portal hypertension with perhaps underlying cirrhosis. Normal gallbladder.There is no biliary ductal dilation. Pancreas: There are multiple calcifications in the pancreas likely related to chronic pancreatitis. Spleen: The spleen is significantly enlarged measuring approximately 16.2 cm craniocaudad. Adrenals/Urinary Tract: --Adrenal glands: No adrenal hemorrhage. --Right kidney/ureter: There are nonobstructing stones throughout  the right kidney. No evidence for right-sided hydronephrosis. Multiple simple appearing cysts are noted throughout the right kidney. There is a 1.3 cm hyperdense nodule in the interpolar region that is incompletely characterized on this exam (axial series 2, image 23). This appears to represent a hemorrhagic or proteinaceous cyst as it is stable from prior CT in 2019. --Left kidney/ureter: Multiple nonobstructing stones are noted throughout the left kidney. There is a stable complex cyst in the interpolar region measuring approximately 2.7 cm. There is a 4 cm complex cyst arising from the lower pole the left kidney (axial series 2, image 37). This is stable from prior study. --Urinary bladder: The urinary bladder is not well evaluated secondary to extensive streak artifact through the patient's pelvis. Stomach/Bowel: --Stomach/Duodenum: No hiatal hernia or other gastric abnormality. Normal duodenal course and caliber. --Small bowel: No dilatation or inflammation. --Colon: There is a surgical anastomosis at the level rectal sigmoid junction. The patient is likely status post partial colectomy. --Appendix: The patient appears to be status post prior appendectomy. Vascular/Lymphatic: Atherosclerotic calcification is present within the non-aneurysmal abdominal aorta, without hemodynamically significant stenosis. Portal hypertension  with portosystemic shunting is noted. There is a large splenorenal shunt. --No retroperitoneal lymphadenopathy. --No mesenteric lymphadenopathy. --No pelvic or inguinal lymphadenopathy. Reproductive: The prostate gland is enlarged. Other: No ascites or free air. The abdominal wall is normal. Musculoskeletal. There are bilateral total hip arthroplasties. The hardware appears grossly intact where visualized. There is some lucency about the femoral component of the right hip prosthesis which is similar across prior studies. There is no acute displaced fracture. IMPRESSION: 1. No acute  abnormality. 2. Bilateral nonobstructing nephrolithiasis. 3. Stigmata of portal hypertension including splenomegaly and portosystemic shunting as detailed above. 4. Again noted is atrophy of the left hepatic lobe. This is stable across prior studies. 5. Additional chronic findings as detailed above. Aortic Atherosclerosis (ICD10-I70.0). Electronically Signed   By: Constance Holster M.D.   On: 09/19/2019 03:04      Subjective: No issues. Feels good. Wants to go home. Feels he can manage just as well at home.  Discharge Exam: Vitals:   09/20/19 1956 09/21/19 0138  BP: 127/63 133/63  Pulse: 67 78  Resp: 13 14  Temp: 97.6 F (36.4 C) 98.1 F (36.7 C)  SpO2: 98% 94%   Vitals:   09/20/19 1322 09/20/19 1956 09/21/19 0138 09/21/19 0437  BP: 123/61 127/63 133/63   Pulse: 66 67 78   Resp: 16 13 14    Temp: 97.7 F (36.5 C) 97.6 F (36.4 C) 98.1 F (36.7 C)   TempSrc: Oral Oral Oral   SpO2: 96% 98% 94%   Weight:    69.3 kg  Height:        General: Pt is alert, awake, not in acute distress Cardiovascular: RRR, S1/S2 +, no rubs, no gallops Respiratory: CTA bilaterally, no wheezing, no rhonchi Abdominal: Soft, NT, ND, bowel sounds + Extremities: no edema, no cyanosis    The results of significant diagnostics from this hospitalization (including imaging, microbiology, ancillary and laboratory) are listed below for reference.     Microbiology: Recent Results (from the past 240 hour(s))  Respiratory Panel by RT PCR (Flu A&B, Covid) - Nasopharyngeal Swab     Status: None   Collection Time: 09/19/19  3:04 AM   Specimen: Nasopharyngeal Swab  Result Value Ref Range Status   SARS Coronavirus 2 by RT PCR NEGATIVE NEGATIVE Final    Comment: (NOTE) SARS-CoV-2 target nucleic acids are NOT DETECTED. The SARS-CoV-2 RNA is generally detectable in upper respiratoy specimens during the acute phase of infection. The lowest concentration of SARS-CoV-2 viral copies this assay can detect is 131  copies/mL. A negative result does not preclude SARS-Cov-2 infection and should not be used as the sole basis for treatment or other patient management decisions. A negative result may occur with  improper specimen collection/handling, submission of specimen other than nasopharyngeal swab, presence of viral mutation(s) within the areas targeted by this assay, and inadequate number of viral copies (<131 copies/mL). A negative result must be combined with clinical observations, patient history, and epidemiological information. The expected result is Negative. Fact Sheet for Patients:  PinkCheek.be Fact Sheet for Healthcare Providers:  GravelBags.it This test is not yet ap proved or cleared by the [place] FDA and  has been authorized for detection and/or diagnosis of SARS-CoV-2 by FDA under an Emergency Use Authorization (EUA). This EUA will remain  in effect (meaning this test can be used) for the duration of the COVID-19 declaration under Section 564(b)(1) of the Act, 21 U.S.C. section 360bbb-3(b)(1), unless the authorization is terminated or revoked sooner.  Influenza A by PCR NEGATIVE NEGATIVE Final   Influenza B by PCR NEGATIVE NEGATIVE Final    Comment: (NOTE) The Xpert Xpress SARS-CoV-2/FLU/RSV assay is intended as an aid in  the diagnosis of influenza from Nasopharyngeal swab specimens and  should not be used as a sole basis for treatment. Nasal washings and  aspirates are unacceptable for Xpert Xpress SARS-CoV-2/FLU/RSV  testing. Fact Sheet for Patients: PinkCheek.be Fact Sheet for Healthcare Providers: GravelBags.it This test is not yet approved or cleared by the [place] FDA and  has been authorized for detection and/or diagnosis of SARS-CoV-2 by  FDA under an Emergency Use Authorization (EUA). This EUA will remain  in effect (meaning this test can  be used) for the duration of the  Covid-19 declaration under Section 564(b)(1) of the Act, 21  U.S.C. section 360bbb-3(b)(1), unless the authorization is  terminated or revoked. Performed at [facility], [place] [address] [place], [place] [address]   Urine culture     Status: None   Collection Time: 09/19/19  4:08 AM   Specimen: Urine, Clean Catch  Result Value Ref Range Status   Specimen Description   Final    URINE, CLEAN CATCH Performed at [facility], [place] [address] [place], [place] [address]    Special Requests   Final    NONE Performed at [facility], [place] [address] [place], [place] [address]    Culture   Final    NO GROWTH Performed at [facility], [place] [address] [place], [place] [address]    Report Status 09/20/2019 FINAL  Final     Labs: BNP (last 3 results) No results for input(s): BNP in the last 8760 hours. Basic Metabolic Panel: Recent Labs  Lab 09/19/19 0014 09/19/19 0500 09/19/19 1400 09/20/19 0240 09/21/19 0341  NA 137 139 139 140 143  K 3.8 4.2 4.0 4.1 4.2  CL 98 103 104 108 116*  CO2 24 25 27 24  21*  GLUCOSE 98 87 93 89 89  BUN 68* 63* 61* 57* 46*  CREATININE 4.48* 4.11* 3.87* 3.06* 2.29*  CALCIUM >15.0* 14.0* 13.0* 12.3* 10.8*  MG 1.7 1.5*  --   --   --   PHOS  --  4.6 4.2 2.5 1.4*   Liver Function Tests: Recent Labs  Lab 09/19/19 0014 09/19/19 0500 09/19/19 1400 09/20/19 0240 09/21/19 0341  AST 31 33  --   --   --   ALT 21 19  --   --   --   ALKPHOS 76 65  --   --   --   BILITOT 3.9* 2.9*  --   --   --   PROT 8.4* 7.4  --   --   --   ALBUMIN 4.3 3.5 3.7 3.4* 3.2*   No results for input(s): LIPASE, AMYLASE in the last 168 hours. No results for input(s): AMMONIA in the last 168 hours. CBC: Recent Labs  Lab 09/19/19 0014 09/19/19 0500 09/20/19 0240 09/21/19 0341  WBC 4.9 4.0 4.5 4.3  NEUTROABS 3.1  --   --   --   HGB 11.7* 10.7* 9.9* 9.1*  HCT 34.1* 30.8*  29.0* 27.3*  MCV 104.6* 104.8* 105.5* 107.9*  PLT 40* 38* 36* 33*   Cardiac Enzymes: No results for input(s): CKTOTAL, CKMB, CKMBINDEX, TROPONINI in the last 168 hours. BNP: Invalid input(s): POCBNP CBG: No results for input(s): GLUCAP in the last 168 hours. D-Dimer No results for input(s):  DDIMER in the last 72 hours. Hgb A1c No results for input(s): HGBA1C in the last 72 hours. Lipid Profile No results for input(s): CHOL, HDL, LDLCALC, TRIG, CHOLHDL, LDLDIRECT in the last 72 hours. Thyroid function studies Recent Labs    09/19/19 0500  TSH 0.432   Anemia work up Recent Labs    09/19/19 0708 09/19/19 1400  VITAMINB12  --  1,338*  FOLATE  --  11.5  FERRITIN 187  --   TIBC 333  --   IRON 135  --    Urinalysis    Component Value Date/Time   COLORURINE AMBER (A) 09/19/2019 0143   APPEARANCEUR CLEAR 09/19/2019 0143   APPEARANCEUR Cloudy 06/12/2012 1454   LABSPEC 1.014 09/19/2019 0143   LABSPEC 1.021 06/12/2012 1454   PHURINE 5.0 09/19/2019 0143   GLUCOSEU NEGATIVE 09/19/2019 0143   GLUCOSEU Negative 06/12/2012 1454   HGBUR LARGE (A) 09/19/2019 0143   BILIRUBINUR NEGATIVE 09/19/2019 0143   BILIRUBINUR Negative 06/12/2012 1454   KETONESUR NEGATIVE 09/19/2019 0143   PROTEINUR 30 (A) 09/19/2019 0143   NITRITE NEGATIVE 09/19/2019 0143   LEUKOCYTESUR SMALL (A) 09/19/2019 0143   LEUKOCYTESUR 1+ 06/12/2012 1454   Sepsis Labs Invalid input(s): PROCALCITONIN,  WBC,  LACTICIDVEN Microbiology Recent Results (from the past 240 hour(s))  Respiratory Panel by RT PCR (Flu A&B, Covid) - Nasopharyngeal Swab     Status: None   Collection Time: 09/19/19  3:04 AM   Specimen: Nasopharyngeal Swab  Result Value Ref Range Status   SARS Coronavirus 2 by RT PCR NEGATIVE NEGATIVE Final    Comment: (NOTE) SARS-CoV-2 target nucleic acids are NOT DETECTED. The SARS-CoV-2 RNA is generally detectable in upper respiratoy specimens during the acute phase of infection. The  lowest concentration of SARS-CoV-2 viral copies this assay can detect is 131 copies/mL. A negative result does not preclude SARS-Cov-2 infection and should not be used as the sole basis for treatment or other patient management decisions. A negative result may occur with  improper specimen collection/handling, submission of specimen other than nasopharyngeal swab, presence of viral mutation(s) within the areas targeted by this assay, and inadequate number of viral copies (<131 copies/mL). A negative result must be combined with clinical observations, patient history, and epidemiological information. The expected result is Negative. Fact Sheet for Patients:  PinkCheek.be Fact Sheet for Healthcare Providers:  GravelBags.it This test is not yet ap proved or cleared by the [place] FDA and  has been authorized for detection and/or diagnosis of SARS-CoV-2 by FDA under an Emergency Use Authorization (EUA). This EUA will remain  in effect (meaning this test can be used) for the duration of the COVID-19 declaration under Section 564(b)(1) of the Act, 21 U.S.C. section 360bbb-3(b)(1), unless the authorization is terminated or revoked sooner.    Influenza A by PCR NEGATIVE NEGATIVE Final   Influenza B by PCR NEGATIVE NEGATIVE Final    Comment: (NOTE) The Xpert Xpress SARS-CoV-2/FLU/RSV assay is intended as an aid in  the diagnosis of influenza from Nasopharyngeal swab specimens and  should not be used as a sole basis for treatment. Nasal washings and  aspirates are unacceptable for Xpert Xpress SARS-CoV-2/FLU/RSV  testing. Fact Sheet for Patients: PinkCheek.be Fact Sheet for Healthcare Providers: GravelBags.it This test is not yet approved or cleared by the [place] FDA and  has been authorized for detection and/or diagnosis of SARS-CoV-2 by  FDA under an Emergency  Use Authorization (EUA). This EUA will remain  in effect (meaning this test can be  used) for the duration of the  Covid-19 declaration under Section 564(b)(1) of the Act, 21  U.S.C. section 360bbb-3(b)(1), unless the authorization is  terminated or revoked. Performed at [facility], [place] [address], [place], [place] [address]   Urine culture     Status: None   Collection Time: 09/19/19  4:08 AM   Specimen: Urine, Clean Catch  Result Value Ref Range Status   Specimen Description   Final    URINE, CLEAN CATCH Performed at [facility], [place] [address], [place], [place] [address]    Special Requests   Final    NONE Performed at [facility], [place] [address], [place], [place] [address]    Culture   Final    NO GROWTH Performed at [facility], [place] [address] [place], [place] [address]    Report Status 09/20/2019 FINAL  Final     Time coordinating discharge: 35   SIGNED:   Cordelia Poche, MD [facility] 09/21/2019, 11:07 AM

## 2019-09-21 NOTE — TOC Progression Note (Signed)
Transition of Care [facility]) - Progression Note    Patient Details  Name: EHITAN GRIEGO MRN: [id number] Date of Birth: Nov 03, 1955  Transition of Care [facility]) CM/SW Contact  Joaquin Courts, RN Phone Number: 09/21/2019, 11:16 AM  Clinical Narrative:   CM spoke with patient regarding substance use. Patient reports he has one or two drinks a night and states he does this mostly to help him sleep.  CM asked if patient is interested in stopping and he reports he has thought about it. CM offered patient ETOH resources to help him stop ETOH use.  Patient declined resources stating he does not need them.     Expected Discharge Plan: Home/Self Care Barriers to Discharge: No Barriers Identified  Expected Discharge Plan and Services Expected Discharge Plan: Home/Self Care   Discharge Planning Services: CM Consult   Living arrangements for the past 2 months: Single Family Home Expected Discharge Date: 09/21/19               DME Arranged: N/A DME Agency: NA       HH Arranged: NA HH Agency: NA         Social Determinants of Health (SDOH) Interventions    Readmission Risk Interventions No flowsheet data found.

## 2019-09-22 LAB — PROTEIN ELECTROPHORESIS, SERUM
A/G Ratio: 1 (ref 0.7–1.7)
Albumin ELP: 3.3 g/dL (ref 2.9–4.4)
Alpha-1-Globulin: 0.2 g/dL (ref 0.0–0.4)
Alpha-2-Globulin: 0.4 g/dL (ref 0.4–1.0)
Beta Globulin: 1.2 g/dL (ref 0.7–1.3)
Gamma Globulin: 1.6 g/dL (ref 0.4–1.8)
Globulin, Total: 3.4 g/dL (ref 2.2–3.9)
Total Protein ELP: 6.7 g/dL (ref 6.0–8.5)

## 2019-09-22 LAB — CYTOLOGY - NON PAP

## 2019-09-23 LAB — UPEP/UIFE/LIGHT CHAINS/TP, 24-HR UR
% BETA, Urine: 0 %
ALPHA 1 URINE: 0 %
Albumin, U: 100 %
Alpha 2, Urine: 0 %
Free Kappa Lt Chains,Ur: 31.67 mg/L (ref 0.63–113.79)
Free Kappa/Lambda Ratio: 5.72 (ref 1.03–31.76)
Free Lambda Lt Chains,Ur: 5.54 mg/L (ref 0.47–11.77)
GAMMA GLOBULIN URINE: 0 %
Total Protein, Urine-Ur/day: 97 mg/24 hr (ref 30–150)
Total Protein, Urine: 5.6 mg/dL
Total Volume: 1725

## 2019-09-25 DIAGNOSIS — G4700 Insomnia, unspecified: Secondary | ICD-10-CM | POA: Diagnosis not present

## 2019-09-25 DIAGNOSIS — D509 Iron deficiency anemia, unspecified: Secondary | ICD-10-CM | POA: Diagnosis not present

## 2019-09-25 DIAGNOSIS — L0293 Carbuncle, unspecified: Secondary | ICD-10-CM | POA: Diagnosis not present

## 2019-09-25 DIAGNOSIS — N179 Acute kidney failure, unspecified: Secondary | ICD-10-CM | POA: Diagnosis not present

## 2019-09-28 LAB — PTH-RELATED PEPTIDE: PTH-related peptide: <2 pmol/L

## 2019-10-22 DIAGNOSIS — Z23 Encounter for immunization: Secondary | ICD-10-CM | POA: Diagnosis not present

## 2019-11-19 DIAGNOSIS — Z23 Encounter for immunization: Secondary | ICD-10-CM | POA: Diagnosis not present

## 2019-11-26 DIAGNOSIS — D509 Iron deficiency anemia, unspecified: Secondary | ICD-10-CM | POA: Diagnosis not present

## 2019-11-26 DIAGNOSIS — G4700 Insomnia, unspecified: Secondary | ICD-10-CM | POA: Diagnosis not present

## 2019-11-26 DIAGNOSIS — R319 Hematuria, unspecified: Secondary | ICD-10-CM | POA: Diagnosis not present

## 2019-11-26 DIAGNOSIS — I10 Essential (primary) hypertension: Secondary | ICD-10-CM | POA: Diagnosis not present

## 2019-11-26 DIAGNOSIS — N179 Acute kidney failure, unspecified: Secondary | ICD-10-CM | POA: Diagnosis not present

## 2019-11-26 DIAGNOSIS — L0293 Carbuncle, unspecified: Secondary | ICD-10-CM | POA: Diagnosis not present

## 2020-05-10 DIAGNOSIS — Z23 Encounter for immunization: Secondary | ICD-10-CM | POA: Diagnosis not present

## 2020-06-17 DIAGNOSIS — K439 Ventral hernia without obstruction or gangrene: Secondary | ICD-10-CM | POA: Diagnosis not present

## 2020-06-17 DIAGNOSIS — Z0000 Encounter for general adult medical examination without abnormal findings: Secondary | ICD-10-CM | POA: Diagnosis not present

## 2020-06-17 DIAGNOSIS — I700 Atherosclerosis of aorta: Secondary | ICD-10-CM | POA: Diagnosis not present

## 2020-06-17 DIAGNOSIS — Z1389 Encounter for screening for other disorder: Secondary | ICD-10-CM | POA: Diagnosis not present

## 2020-06-17 DIAGNOSIS — J449 Chronic obstructive pulmonary disease, unspecified: Secondary | ICD-10-CM | POA: Diagnosis not present

## 2020-06-17 DIAGNOSIS — Z79899 Other long term (current) drug therapy: Secondary | ICD-10-CM | POA: Diagnosis not present

## 2020-06-17 DIAGNOSIS — G4700 Insomnia, unspecified: Secondary | ICD-10-CM | POA: Diagnosis not present

## 2020-06-17 DIAGNOSIS — I10 Essential (primary) hypertension: Secondary | ICD-10-CM | POA: Diagnosis not present

## 2020-06-17 DIAGNOSIS — I8500 Esophageal varices without bleeding: Secondary | ICD-10-CM | POA: Diagnosis not present

## 2020-06-17 DIAGNOSIS — Z125 Encounter for screening for malignant neoplasm of prostate: Secondary | ICD-10-CM | POA: Diagnosis not present

## 2020-06-17 DIAGNOSIS — Z1211 Encounter for screening for malignant neoplasm of colon: Secondary | ICD-10-CM | POA: Diagnosis not present

## 2020-06-17 DIAGNOSIS — F17200 Nicotine dependence, unspecified, uncomplicated: Secondary | ICD-10-CM | POA: Diagnosis not present

## 2020-06-17 DIAGNOSIS — K7030 Alcoholic cirrhosis of liver without ascites: Secondary | ICD-10-CM | POA: Diagnosis not present

## 2020-06-17 DIAGNOSIS — E559 Vitamin D deficiency, unspecified: Secondary | ICD-10-CM | POA: Diagnosis not present

## 2020-06-17 DIAGNOSIS — D696 Thrombocytopenia, unspecified: Secondary | ICD-10-CM | POA: Diagnosis not present

## 2020-06-17 DIAGNOSIS — D509 Iron deficiency anemia, unspecified: Secondary | ICD-10-CM | POA: Diagnosis not present

## 2020-06-23 DIAGNOSIS — Z23 Encounter for immunization: Secondary | ICD-10-CM | POA: Diagnosis not present

## 2020-12-03 DIAGNOSIS — L02415 Cutaneous abscess of right lower limb: Secondary | ICD-10-CM | POA: Diagnosis not present

## 2020-12-15 DIAGNOSIS — Z1211 Encounter for screening for malignant neoplasm of colon: Secondary | ICD-10-CM | POA: Diagnosis not present

## 2020-12-15 DIAGNOSIS — K7030 Alcoholic cirrhosis of liver without ascites: Secondary | ICD-10-CM | POA: Diagnosis not present

## 2020-12-15 DIAGNOSIS — D696 Thrombocytopenia, unspecified: Secondary | ICD-10-CM | POA: Diagnosis not present

## 2020-12-15 DIAGNOSIS — N1832 Chronic kidney disease, stage 3b: Secondary | ICD-10-CM | POA: Diagnosis not present

## 2020-12-15 DIAGNOSIS — I10 Essential (primary) hypertension: Secondary | ICD-10-CM | POA: Diagnosis not present

## 2020-12-15 DIAGNOSIS — J449 Chronic obstructive pulmonary disease, unspecified: Secondary | ICD-10-CM | POA: Diagnosis not present

## 2020-12-15 DIAGNOSIS — F17200 Nicotine dependence, unspecified, uncomplicated: Secondary | ICD-10-CM | POA: Diagnosis not present

## 2020-12-15 DIAGNOSIS — Z79899 Other long term (current) drug therapy: Secondary | ICD-10-CM | POA: Diagnosis not present

## 2020-12-15 DIAGNOSIS — I700 Atherosclerosis of aorta: Secondary | ICD-10-CM | POA: Diagnosis not present

## 2020-12-15 DIAGNOSIS — G4700 Insomnia, unspecified: Secondary | ICD-10-CM | POA: Diagnosis not present

## 2020-12-15 DIAGNOSIS — K439 Ventral hernia without obstruction or gangrene: Secondary | ICD-10-CM | POA: Diagnosis not present

## 2020-12-23 ENCOUNTER — Other Ambulatory Visit: Payer: Self-pay | Admitting: Internal Medicine

## 2020-12-23 DIAGNOSIS — F17228 Nicotine dependence, chewing tobacco, with other nicotine-induced disorders: Secondary | ICD-10-CM

## 2021-01-07 ENCOUNTER — Inpatient Hospital Stay: Admission: RE | Admit: 2021-01-07 | Payer: Medicare Other | Source: Ambulatory Visit

## 2021-03-10 DIAGNOSIS — M25551 Pain in right hip: Secondary | ICD-10-CM | POA: Diagnosis not present

## 2021-03-10 DIAGNOSIS — Z96641 Presence of right artificial hip joint: Secondary | ICD-10-CM | POA: Diagnosis not present

## 2021-03-10 DIAGNOSIS — Z96649 Presence of unspecified artificial hip joint: Secondary | ICD-10-CM | POA: Diagnosis not present

## 2021-03-22 DIAGNOSIS — T859XXA Unspecified complication of internal prosthetic device, implant and graft, initial encounter: Secondary | ICD-10-CM | POA: Diagnosis not present

## 2021-03-22 DIAGNOSIS — Z96641 Presence of right artificial hip joint: Secondary | ICD-10-CM | POA: Diagnosis not present

## 2021-03-28 ENCOUNTER — Other Ambulatory Visit: Payer: Self-pay | Admitting: Orthopedic Surgery

## 2021-03-28 DIAGNOSIS — Z96641 Presence of right artificial hip joint: Secondary | ICD-10-CM

## 2021-04-13 ENCOUNTER — Encounter
Admission: RE | Admit: 2021-04-13 | Discharge: 2021-04-13 | Disposition: A | Discharge status: Home / Self Care | Payer: Medicare Other | Source: Ambulatory Visit | Attending: Orthopedic Surgery | Admitting: Orthopedic Surgery

## 2021-04-13 ENCOUNTER — Other Ambulatory Visit: Payer: Self-pay

## 2021-04-13 DIAGNOSIS — M25551 Pain in right hip: Secondary | ICD-10-CM | POA: Diagnosis not present

## 2021-04-13 DIAGNOSIS — Z96641 Presence of right artificial hip joint: Secondary | ICD-10-CM | POA: Diagnosis not present

## 2021-04-13 MED ORDER — TECHNETIUM TC 99M MEDRONATE IV KIT
20.0000 | PACK | Freq: Once | INTRAVENOUS | Status: AC | PRN
  Administered 2021-04-13: 19.79 via INTRAVENOUS

## 2021-04-22 DIAGNOSIS — Z96641 Presence of right artificial hip joint: Secondary | ICD-10-CM | POA: Diagnosis not present

## 2021-04-22 DIAGNOSIS — T859XXA Unspecified complication of internal prosthetic device, implant and graft, initial encounter: Secondary | ICD-10-CM | POA: Diagnosis not present

## 2021-05-04 DIAGNOSIS — L239 Allergic contact dermatitis, unspecified cause: Secondary | ICD-10-CM | POA: Diagnosis not present

## 2021-05-04 DIAGNOSIS — I509 Heart failure, unspecified: Secondary | ICD-10-CM | POA: Diagnosis not present

## 2021-05-04 DIAGNOSIS — R609 Edema, unspecified: Secondary | ICD-10-CM | POA: Diagnosis not present

## 2021-05-05 DIAGNOSIS — Z23 Encounter for immunization: Secondary | ICD-10-CM | POA: Diagnosis not present

## 2021-05-12 ENCOUNTER — Encounter (HOSPITAL_COMMUNITY): Payer: Self-pay

## 2021-05-12 ENCOUNTER — Emergency Department (HOSPITAL_COMMUNITY)
Admission: EM | Admit: 2021-05-12 | Discharge: 2021-05-13 | Disposition: A | Discharge status: Home / Self Care | Payer: Medicare Other | Attending: Emergency Medicine | Admitting: Emergency Medicine

## 2021-05-12 ENCOUNTER — Ambulatory Visit (INDEPENDENT_AMBULATORY_CARE_PROVIDER_SITE_OTHER)
Admission: EM | Admit: 2021-05-12 | Discharge: 2021-05-12 | Disposition: A | Discharge status: Other Acute Inpatient Hospital | Payer: Medicare Other | Source: Home / Self Care

## 2021-05-12 ENCOUNTER — Other Ambulatory Visit: Payer: Self-pay

## 2021-05-12 DIAGNOSIS — Z20822 Contact with and (suspected) exposure to covid-19: Secondary | ICD-10-CM | POA: Diagnosis not present

## 2021-05-12 DIAGNOSIS — Z96643 Presence of artificial hip joint, bilateral: Secondary | ICD-10-CM | POA: Insufficient documentation

## 2021-05-12 DIAGNOSIS — M25551 Pain in right hip: Secondary | ICD-10-CM | POA: Insufficient documentation

## 2021-05-12 DIAGNOSIS — Z7289 Other problems related to lifestyle: Secondary | ICD-10-CM | POA: Insufficient documentation

## 2021-05-12 DIAGNOSIS — R45851 Suicidal ideations: Secondary | ICD-10-CM | POA: Insufficient documentation

## 2021-05-12 DIAGNOSIS — F17210 Nicotine dependence, cigarettes, uncomplicated: Secondary | ICD-10-CM | POA: Insufficient documentation

## 2021-05-12 DIAGNOSIS — Z79899 Other long term (current) drug therapy: Secondary | ICD-10-CM | POA: Insufficient documentation

## 2021-05-12 DIAGNOSIS — Z9181 History of falling: Secondary | ICD-10-CM | POA: Insufficient documentation

## 2021-05-12 DIAGNOSIS — F29 Unspecified psychosis not due to a substance or known physiological condition: Secondary | ICD-10-CM | POA: Diagnosis not present

## 2021-05-12 DIAGNOSIS — F332 Major depressive disorder, recurrent severe without psychotic features: Secondary | ICD-10-CM | POA: Insufficient documentation

## 2021-05-12 DIAGNOSIS — R9431 Abnormal electrocardiogram [ECG] [EKG]: Secondary | ICD-10-CM | POA: Diagnosis not present

## 2021-05-12 DIAGNOSIS — F32A Depression, unspecified: Secondary | ICD-10-CM | POA: Insufficient documentation

## 2021-05-12 DIAGNOSIS — R29898 Other symptoms and signs involving the musculoskeletal system: Secondary | ICD-10-CM | POA: Diagnosis not present

## 2021-05-12 MED ORDER — NICOTINE 21 MG/24HR TD PT24
21.0000 mg | MEDICATED_PATCH | Freq: Every day | TRANSDERMAL | Status: DC
  Administered 2021-05-13 (×2): 21 mg via TRANSDERMAL
  Filled 2021-05-12 (×2): qty 1

## 2021-05-12 MED ORDER — NICOTINE 21 MG/24HR TD PT24: 21.0000 mg | MEDICATED_PATCH | Freq: Every day | TRANSDERMAL | Status: DC

## 2021-05-12 MED ORDER — ESCITALOPRAM OXALATE 10 MG PO TABS
10.0000 mg | ORAL_TABLET | Freq: Every day | ORAL | Status: DC
  Administered 2021-05-13: 10 mg via ORAL
  Filled 2021-05-12: qty 1

## 2021-05-12 NOTE — ED Triage Notes (Signed)
Pt BIB EMS from [facility]. Pt here due to mobility issues, EMS reports [name] stated they could not keep him. Pt c/o right hip pain. Pt has hx of surgery to right hip. Pt had a fall 3 days ago that has caused his right hip to hurt worse, pt has chronic pain to right hip.  99% RA
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
## 2021-05-12 NOTE — BH Assessment (Signed)
Pt was brought in to [facility] by sister.  He has a lot of physical pain due to orthopedic issues.  He had had thoughts of killing himself with shooting himself.  He has access to a gun.  Pt denies any HI or A/V hallucinations.

## 2021-05-12 NOTE — Discharge Instructions (Signed)
Patient to be transferred to [facility] ED for overnight observation/safety monitoring

## 2021-05-12 NOTE — ED Notes (Signed)
EMS called for transport d/t pt high risk for falls
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
## 2021-05-13 DIAGNOSIS — R9431 Abnormal electrocardiogram [ECG] [EKG]: Secondary | ICD-10-CM | POA: Diagnosis not present

## 2021-05-13 DIAGNOSIS — R45851 Suicidal ideations: Secondary | ICD-10-CM | POA: Diagnosis not present

## 2021-05-13 DIAGNOSIS — F32A Depression, unspecified: Secondary | ICD-10-CM | POA: Diagnosis not present

## 2021-05-13 LAB — RAPID URINE DRUG SCREEN, HOSP PERFORMED
Amphetamines: NOT DETECTED
Barbiturates: NOT DETECTED
Benzodiazepines: POSITIVE — AB
Cocaine: NOT DETECTED
Opiates: NOT DETECTED
Tetrahydrocannabinol: NOT DETECTED

## 2021-05-13 LAB — CBC WITH DIFFERENTIAL/PLATELET
Abs Immature Granulocytes: 0.03 10*3/uL (ref 0.00–0.07)
Basophils Absolute: 0 10*3/uL (ref 0.0–0.1)
Basophils Relative: 0 %
Eosinophils Absolute: 0.1 10*3/uL (ref 0.0–0.5)
Eosinophils Relative: 2 %
HCT: 30.6 % — ABNORMAL LOW (ref 39.0–52.0)
Hemoglobin: 10 g/dL — ABNORMAL LOW (ref 13.0–17.0)
Immature Granulocytes: 1 %
Lymphocytes Relative: 15 %
Lymphs Abs: 0.9 10*3/uL (ref 0.7–4.0)
MCH: 35.1 pg — ABNORMAL HIGH (ref 26.0–34.0)
MCHC: 32.7 g/dL (ref 30.0–36.0)
MCV: 107.4 fL — ABNORMAL HIGH (ref 80.0–100.0)
Monocytes Absolute: 0.5 10*3/uL (ref 0.1–1.0)
Monocytes Relative: 8 %
Neutro Abs: 4.3 10*3/uL (ref 1.7–7.7)
Neutrophils Relative %: 74 %
Platelets: 66 10*3/uL — ABNORMAL LOW (ref 150–400)
RBC: 2.85 MIL/uL — ABNORMAL LOW (ref 4.22–5.81)
RDW: 17.6 % — ABNORMAL HIGH (ref 11.5–15.5)
WBC: 5.8 10*3/uL (ref 4.0–10.5)
nRBC: 0 % (ref 0.0–0.2)

## 2021-05-13 LAB — COMPREHENSIVE METABOLIC PANEL
ALT: 46 U/L — ABNORMAL HIGH (ref 0–44)
AST: 57 U/L — ABNORMAL HIGH (ref 15–41)
Albumin: 3.4 g/dL — ABNORMAL LOW (ref 3.5–5.0)
Alkaline Phosphatase: 143 U/L — ABNORMAL HIGH (ref 38–126)
Anion gap: 10 (ref 5–15)
BUN: 34 mg/dL — ABNORMAL HIGH (ref 8–23)
CO2: 22 mmol/L (ref 22–32)
Calcium: 9.9 mg/dL (ref 8.9–10.3)
Chloride: 110 mmol/L (ref 98–111)
Creatinine, Ser: 1.98 mg/dL — ABNORMAL HIGH (ref 0.61–1.24)
GFR, Estimated: 36 mL/min — ABNORMAL LOW (ref >60)
Glucose, Bld: 104 mg/dL — ABNORMAL HIGH (ref 70–99)
Potassium: 3.6 mmol/L (ref 3.5–5.1)
Sodium: 142 mmol/L (ref 135–145)
Total Bilirubin: 4 mg/dL — ABNORMAL HIGH (ref 0.3–1.2)
Total Protein: 8.2 g/dL — ABNORMAL HIGH (ref 6.5–8.1)

## 2021-05-13 LAB — RESP PANEL BY RT-PCR (FLU A&B, COVID) ARPGX2
Influenza A by PCR: NEGATIVE
Influenza B by PCR: NEGATIVE
SARS Coronavirus 2 by RT PCR: NEGATIVE

## 2021-05-13 LAB — ACETAMINOPHEN LEVEL: Acetaminophen (Tylenol), Serum: <10 ug/mL — ABNORMAL LOW (ref 10–30)

## 2021-05-13 LAB — SALICYLATE LEVEL: Salicylate Lvl: <7 mg/dL — ABNORMAL LOW (ref 7.0–30.0)

## 2021-05-13 LAB — ETHANOL: Alcohol, Ethyl (B): <10 mg/dL (ref <10)

## 2021-05-13 MED ORDER — CEPHALEXIN 250 MG PO CAPS
250.0000 mg | ORAL_CAPSULE | Freq: Every day | ORAL | Status: DC
  Administered 2021-05-13: 250 mg via ORAL
  Filled 2021-05-13: qty 1

## 2021-05-13 MED ORDER — BUPROPION HCL ER (SR) 100 MG PO TB12: 100.0000 mg | ORAL_TABLET | Freq: Every day | ORAL | Status: DC

## 2021-05-13 MED ORDER — ACETAMINOPHEN 325 MG PO TABS: 325.0000 mg | ORAL_TABLET | Freq: Four times a day (QID) | ORAL | Status: DC | PRN

## 2021-05-13 MED ORDER — METOPROLOL TARTRATE 25 MG PO TABS
25.0000 mg | ORAL_TABLET | Freq: Two times a day (BID) | ORAL | Status: DC
  Administered 2021-05-13: 25 mg via ORAL
  Filled 2021-05-13: qty 1

## 2021-05-13 MED ORDER — BUPROPION HCL ER (SR) 100 MG PO TB12: 100.0000 mg | ORAL_TABLET | Freq: Every day | ORAL | 0 refills | Status: DC

## 2021-05-13 MED ORDER — TEMAZEPAM 15 MG PO CAPS: 15.0000 mg | ORAL_CAPSULE | Freq: Every evening | ORAL | Status: DC | PRN

## 2021-05-13 MED ORDER — PREDNISONE 5 MG (48) PO TBPK: 5.0000 mg | ORAL_TABLET | ORAL | Status: DC

## 2021-05-13 MED ORDER — PREDNISONE 5 MG PO TABS
5.0000 mg | ORAL_TABLET | Freq: Two times a day (BID) | ORAL | Status: DC
  Administered 2021-05-13: 5 mg via ORAL
  Filled 2021-05-13 (×2): qty 1

## 2021-05-13 MED ORDER — ONDANSETRON 8 MG PO TBDP
8.0000 mg | ORAL_TABLET | Freq: Once | ORAL | Status: AC
  Administered 2021-05-13: 8 mg via ORAL
  Filled 2021-05-13: qty 1

## 2021-05-13 NOTE — BH Assessment (Signed)
[place] Assessment Progress Note   Per Ricky Ala, NP, this voluntary pt does not require psychiatric hospitalization at this time.  Pt is psychiatrically cleared.  Pt would benefit from Partial Hospitalization Program at the [facility], which is currently being offered virtually due to Covid-19.  This Probation officer has spoken to pt and he would like to enroll.  Pt only has a flip phone, no computer/tablet/smart phone.  I have spoken to Lorin Glass, LCSW, who understands pt's IT limitations, and has scheduled pt for virtual intake on Tuesday, 05/17/2021  at 10:00.  Please note that Ludger Nutting is also aware of pt's technological limitations.  Appointment has been included in pt's discharge instructions.  EDP Lacretia Leigh, MD and pt's nurse, Lourdes Sledge, have been notified.  Jalene Mullet, [place] Triage Specialist [phone]

## 2021-05-13 NOTE — ED Provider Notes (Signed)
Patient seen by psychiatry and cleared for discharge.  He denies any SI or HI.   Lacretia Leigh, MD 05/13/21 [phone]

## 2021-05-13 NOTE — Discharge Instructions (Addendum)
For your behavioral health needs, you are advised to follow up with the Partial Hospitalization Program (PHP) at the [facility] at [place].  This program meets Monday - Friday from 9:00 am - 1:00 pm.  You are scheduled for a virtual intake appointment on Tueday, May 17, 2021 at 10:00 am.  If you have any questions, contact Lorin Glass, LCSW at the phone number indicated below:       [facility]. [address] [place], [place] [address]      Contact person: Lorin Glass, LCSW      [phone]
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
## 2021-05-13 NOTE — ED Notes (Signed)
Sitter at pt bedside.

## 2021-05-13 NOTE — ED Notes (Signed)
Pt refuse lunch tray, pt stated that he does not want to eat.

## 2021-05-17 ENCOUNTER — Telehealth (HOSPITAL_COMMUNITY): Payer: Self-pay | Admitting: Professional

## 2021-05-17 ENCOUNTER — Ambulatory Visit (HOSPITAL_COMMUNITY): Payer: Medicare Other

## 2021-05-19 ENCOUNTER — Telehealth (HOSPITAL_COMMUNITY): Payer: Self-pay | Admitting: Internal Medicine

## 2021-05-19 NOTE — BH Assessment (Signed)
Care Management - Follow Up [facility] Discharges   Writer made contact with the patient. Patient reports that he has a follow up appointment at a mental health facility on [address]

## 2021-05-30 DIAGNOSIS — M24859 Other specific joint derangements of unspecified hip, not elsewhere classified: Secondary | ICD-10-CM | POA: Diagnosis not present

## 2021-06-07 DIAGNOSIS — Z79899 Other long term (current) drug therapy: Secondary | ICD-10-CM | POA: Diagnosis not present

## 2021-06-07 DIAGNOSIS — F331 Major depressive disorder, recurrent, moderate: Secondary | ICD-10-CM | POA: Diagnosis not present

## 2021-06-14 DIAGNOSIS — N492 Inflammatory disorders of scrotum: Secondary | ICD-10-CM | POA: Diagnosis not present

## 2021-06-20 DIAGNOSIS — Z0000 Encounter for general adult medical examination without abnormal findings: Secondary | ICD-10-CM | POA: Diagnosis not present

## 2021-06-20 DIAGNOSIS — Z1389 Encounter for screening for other disorder: Secondary | ICD-10-CM | POA: Diagnosis not present

## 2021-06-24 DIAGNOSIS — D649 Anemia, unspecified: Secondary | ICD-10-CM | POA: Diagnosis not present

## 2021-06-24 DIAGNOSIS — M25551 Pain in right hip: Secondary | ICD-10-CM | POA: Diagnosis not present

## 2021-06-29 ENCOUNTER — Telehealth: Payer: Self-pay | Admitting: Hematology and Oncology

## 2021-06-29 NOTE — Telephone Encounter (Signed)
Scheduled appt per 11/16 referral. Pt is aware of appt date and time . 

## 2021-07-04 DIAGNOSIS — Z79899 Other long term (current) drug therapy: Secondary | ICD-10-CM | POA: Diagnosis not present

## 2021-07-04 DIAGNOSIS — F331 Major depressive disorder, recurrent, moderate: Secondary | ICD-10-CM | POA: Diagnosis not present

## 2021-07-05 ENCOUNTER — Encounter: Payer: Self-pay | Admitting: Urgent Care

## 2021-07-06 ENCOUNTER — Encounter: Payer: Self-pay | Admitting: Orthopedic Surgery

## 2021-07-06 ENCOUNTER — Other Ambulatory Visit: Payer: Self-pay

## 2021-07-06 ENCOUNTER — Other Ambulatory Visit: Payer: Medicare Other

## 2021-07-06 ENCOUNTER — Other Ambulatory Visit: Payer: Self-pay | Admitting: Orthopedic Surgery

## 2021-07-06 ENCOUNTER — Other Ambulatory Visit
Admission: RE | Admit: 2021-07-06 | Discharge: 2021-07-06 | Disposition: A | Discharge status: Home / Self Care | Payer: Medicare Other | Source: Ambulatory Visit | Attending: Orthopedic Surgery | Admitting: Orthopedic Surgery

## 2021-07-06 DIAGNOSIS — Z01812 Encounter for preprocedural laboratory examination: Secondary | ICD-10-CM | POA: Insufficient documentation

## 2021-07-06 DIAGNOSIS — D696 Thrombocytopenia, unspecified: Secondary | ICD-10-CM | POA: Insufficient documentation

## 2021-07-06 DIAGNOSIS — Z01810 Encounter for preprocedural cardiovascular examination: Secondary | ICD-10-CM

## 2021-07-06 HISTORY — DX: Idiopathic aseptic necrosis of unspecified bone: M87.00

## 2021-07-06 HISTORY — DX: Chronic obstructive pulmonary disease, unspecified: J44.9

## 2021-07-06 HISTORY — DX: Anemia, unspecified: D64.9

## 2021-07-06 LAB — CBC WITH DIFFERENTIAL/PLATELET
Abs Immature Granulocytes: 0.01 10*3/uL (ref 0.00–0.07)
Basophils Absolute: 0 10*3/uL (ref 0.0–0.1)
Basophils Relative: 1 %
Eosinophils Absolute: 0.2 10*3/uL (ref 0.0–0.5)
Eosinophils Relative: 4 %
HCT: 26.4 % — ABNORMAL LOW (ref 39.0–52.0)
Hemoglobin: 8.5 g/dL — ABNORMAL LOW (ref 13.0–17.0)
Immature Granulocytes: 0 %
Lymphocytes Relative: 17 %
Lymphs Abs: 0.7 10*3/uL (ref 0.7–4.0)
MCH: 33.2 pg (ref 26.0–34.0)
MCHC: 32.2 g/dL (ref 30.0–36.0)
MCV: 103.1 fL — ABNORMAL HIGH (ref 80.0–100.0)
Monocytes Absolute: 0.3 10*3/uL (ref 0.1–1.0)
Monocytes Relative: 8 %
Neutro Abs: 2.8 10*3/uL (ref 1.7–7.7)
Neutrophils Relative %: 70 %
Platelets: 75 10*3/uL — ABNORMAL LOW (ref 150–400)
RBC: 2.56 MIL/uL — ABNORMAL LOW (ref 4.22–5.81)
RDW: 17.3 % — ABNORMAL HIGH (ref 11.5–15.5)
WBC: 4 10*3/uL (ref 4.0–10.5)
nRBC: 0 % (ref 0.0–0.2)

## 2021-07-06 LAB — TYPE AND SCREEN
ABO/RH(D): AB POS
Antibody Screen: NEGATIVE

## 2021-07-06 LAB — COMPREHENSIVE METABOLIC PANEL
ALT: 21 U/L (ref 0–44)
AST: 50 U/L — ABNORMAL HIGH (ref 15–41)
Albumin: 2.9 g/dL — ABNORMAL LOW (ref 3.5–5.0)
Alkaline Phosphatase: 95 U/L (ref 38–126)
Anion gap: 9 (ref 5–15)
BUN: 16 mg/dL (ref 8–23)
CO2: 22 mmol/L (ref 22–32)
Calcium: 9.2 mg/dL (ref 8.9–10.3)
Chloride: 105 mmol/L (ref 98–111)
Creatinine, Ser: 1.71 mg/dL — ABNORMAL HIGH (ref 0.61–1.24)
GFR, Estimated: 43 mL/min — ABNORMAL LOW (ref >60)
Glucose, Bld: 87 mg/dL (ref 70–99)
Potassium: 3.4 mmol/L — ABNORMAL LOW (ref 3.5–5.1)
Sodium: 136 mmol/L (ref 135–145)
Total Bilirubin: 4.2 mg/dL — ABNORMAL HIGH (ref 0.3–1.2)
Total Protein: 7.5 g/dL (ref 6.5–8.1)

## 2021-07-06 LAB — URINALYSIS, ROUTINE W REFLEX MICROSCOPIC
Bacteria, UA: NONE SEEN
Bilirubin Urine: NEGATIVE
Glucose, UA: NEGATIVE mg/dL
Ketones, ur: NEGATIVE mg/dL
Leukocytes,Ua: NEGATIVE
Nitrite: NEGATIVE
Protein, ur: NEGATIVE mg/dL
Specific Gravity, Urine: 1.014 (ref 1.005–1.030)
Squamous Epithelial / HPF: NONE SEEN (ref 0–5)
pH: 5 (ref 5.0–8.0)

## 2021-07-06 LAB — SURGICAL PCR SCREEN
MRSA, PCR: NEGATIVE
Staphylococcus aureus: NEGATIVE

## 2021-07-06 NOTE — Progress Notes (Signed)
  [facility] Perioperative Services: Pre-Admission/Anesthesia Testing  Abnormal Lab Notification   Date: 07/06/21 Name: [name] MRN:   [id number]  Re: Abnormal labs noted during PAT appointment   Notified:  Provider Name Provider Role Notification Mode  Lovell Sheehan, MD Orthopedics Routed and/or faxed via [place] and Notes:  ABNORMAL LAB VALUE(S): Lab Results  Component Value Date   CREATININE 1.71 (H) 07/06/2021   ALBUMIN 2.9 (L) 07/06/2021   AST 50 (H) 07/06/2021   BILITOT 4.2 (H) 07/06/2021   HGB 8.5 (L) 07/06/2021   HCT 26.4 (L) 07/06/2021   MCV 103.1 (H) 07/06/2021   PLT 75 (L) 07/06/2021   [name] is scheduled for a TOTAL HIP REVISION (Right: Hip) on 07/11/2021. Patient reportedly seeing hematology Friday (07/08/2021). With his unexplained thrombocytopenia (range over the last year has been 33-75 K/uL), further workup is warranted for assessment of potentially more serious conditions on the differential list such as ITP or an underlying myelodysplastic syndrome. Beyond further laboratory testing, is likely that patient will need bone marrow sampling and an ultrasound of his spleen to confirm/rule out the aforementioned conditions.   With that being said, patient is still on the schedule for Monday (07/11/2021). Given the elective nature of his procedure, and need for further evaluation by hematology, I think that it would be prudent to postpone his case. Again, patient is seeing hematology on Friday and wants to wait for their opinion prior to postponing. Office aware and advising that patient will be calling them Monday to touch base with them following visit with hematology. This is reasonable, however I do think that it would be in his best interest to undergo any diagnostic testing needed by hematology prior to pursuing this elective orthopedic procedure.   Honor Loh, MSN, APRN, FNP-C, CEN [facility]   Peri-operative Services Nurse Practitioner Phone: [phone] Fax: [phone] 07/06/21 4:59 PM
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
## 2021-07-06 NOTE — Patient Instructions (Addendum)
Your procedure is scheduled on:07-11-21 Monday Report to the Registration Desk on the 1st floor of the [facility].Then proceed to the 2nd floor Surgery Desk in the [facility] To find out your arrival time, please call [phone] between 1PM - 3PM on:07-08-21 Friday  REMEMBER: Instructions that are not followed completely may result in serious medical risk, up to and including death; or upon the discretion of your surgeon and anesthesiologist your surgery may need to be rescheduled.  Do not eat food after midnight the night before surgery.  No gum chewing, lozengers or hard candies.  You may however, drink CLEAR liquids up to 2 hours before you are scheduled to arrive for your surgery. Do not drink anything within 2 hours of your scheduled arrival time.  Clear liquids include: - water  - apple juice without pulp - gatorade (not RED, PURPLE, OR BLUE) - black coffee or tea (Do NOT add milk or creamers to the coffee or tea) Do NOT drink anything that is not on this list.  TAKE THESE MEDICATIONS THE MORNING OF SURGERY WITH A SIP OF WATER: -buPROPion (WELLBUTRIN XL) 150 MG 24 hr tablet -Famotidine-Ca Carb-Mag Hydrox (PEPCID COMPLETE PO)-Take one the night before your surgery and again the morning of surgery -You may take ALPRAZolam (XANAX) 0.25 MG tablet the day of surgery if needed for anxiety  One week prior to surgery: Stop Anti-inflammatories (NSAIDS) such as Advil, Aleve, Ibuprofen, Motrin, Naproxen, Naprosyn and Aspirin based products such as Excedrin, Goodys Powder, BC Powder.You may however, continue to take Tylenol if needed for pain up until the day of surgery.  Stop ANY OVER THE COUNTER supplements/vitamins NOW (07-06-21) until after surgery- (Vitamin C)- Continue your ferrous sulfate up until the day prior to surgery  No Alcohol for 24 hours before or after surgery.  No Smoking including e-cigarettes for 24 hours prior to surgery.  No chewable tobacco products for at  least 6 hours prior to surgery.  No nicotine patches on the day of surgery.  Do not use any "recreational" drugs for at least a week prior to your surgery.  Please be advised that the combination of cocaine and anesthesia may have negative outcomes, up to and including death. If you test positive for cocaine, your surgery will be cancelled.  On the morning of surgery brush your teeth with toothpaste and water, you may rinse your mouth with mouthwash if you wish. Do not swallow any toothpaste or mouthwash.  Use CHG Soap as directed on instruction sheet.  Do not wear jewelry, make-up, hairpins, clips or nail polish.  Do not wear lotions, powders, or perfumes.   Do not shave body from the neck down 48 hours prior to surgery just in case you cut yourself which could leave a site for infection.  Also, freshly shaved skin may become irritated if using the CHG soap.  Contact lenses, hearing aids and dentures may not be worn into surgery.  Do not bring valuables to the hospital. [facility] is not responsible for any missing/lost belongings or valuables.   Notify your doctor if there is any change in your medical condition (cold, fever, infection).  Wear comfortable clothing (specific to your surgery type) to the hospital.  After surgery, you can help prevent lung complications by doing breathing exercises.  Take deep breaths and cough every 1-2 hours. Your doctor may order a device called an Incentive Spirometer to help you take deep breaths. When coughing or sneezing, hold a pillow firmly against your  incision with both hands. This is called "splinting." Doing this helps protect your incision. It also decreases belly discomfort.  If you are being admitted to the hospital overnight, leave your suitcase in the car. After surgery it may be brought to your room.  If you are being discharged the day of surgery, you will not be allowed to drive home. You will need a responsible adult (18 years  or older) to drive you home and stay with you that night.   If you are taking public transportation, you will need to have a responsible adult (18 years or older) with you. Please confirm with your physician that it is acceptable to use public transportation.   Please call the [facility] at [phone] if you have any questions about these instructions.  Surgery Visitation Policy:  Patients undergoing a surgery or procedure may have one family member or support person with them as long as that person is not COVID-19 positive or experiencing its symptoms.  That person may remain in the waiting area during the procedure and may rotate out with other people.  Inpatient Visitation:    Visiting hours are 7 a.m. to 8 p.m. Up to two visitors ages 16+ are allowed at one time in a patient room. The visitors may rotate out with other people during the day. Visitors must check out when they leave, or other visitors will not be allowed. One designated support person may remain overnight. The visitor must pass COVID-19 screenings, use hand sanitizer when entering and exiting the patient's room and wear a mask at all times, including in the patient's room. Patients must also wear a mask when staff or their visitor are in the room. Masking is required regardless of vaccination status.

## 2021-07-06 NOTE — Pre-Procedure Instructions (Signed)
Pt will need Rapid covid test the day of surgery

## 2021-07-08 ENCOUNTER — Inpatient Hospital Stay: Payer: Medicare Other

## 2021-07-08 ENCOUNTER — Inpatient Hospital Stay: Payer: Medicare Other | Attending: Hematology and Oncology | Admitting: Hematology and Oncology

## 2021-07-08 ENCOUNTER — Encounter: Payer: Self-pay | Admitting: Hematology and Oncology

## 2021-07-08 ENCOUNTER — Other Ambulatory Visit: Payer: Self-pay

## 2021-07-08 ENCOUNTER — Telehealth: Payer: Self-pay | Admitting: *Deleted

## 2021-07-08 VITALS — BP 142/70 | HR 105 | Temp 97.1°F | Resp 18 | Wt 136.9 lb

## 2021-07-08 DIAGNOSIS — Z888 Allergy status to other drugs, medicaments and biological substances status: Secondary | ICD-10-CM | POA: Insufficient documentation

## 2021-07-08 DIAGNOSIS — M25551 Pain in right hip: Secondary | ICD-10-CM | POA: Diagnosis not present

## 2021-07-08 DIAGNOSIS — Z800 Family history of malignant neoplasm of digestive organs: Secondary | ICD-10-CM | POA: Insufficient documentation

## 2021-07-08 DIAGNOSIS — Z79899 Other long term (current) drug therapy: Secondary | ICD-10-CM | POA: Insufficient documentation

## 2021-07-08 DIAGNOSIS — D649 Anemia, unspecified: Secondary | ICD-10-CM | POA: Diagnosis not present

## 2021-07-08 DIAGNOSIS — Z87891 Personal history of nicotine dependence: Secondary | ICD-10-CM | POA: Insufficient documentation

## 2021-07-08 DIAGNOSIS — K7460 Unspecified cirrhosis of liver: Secondary | ICD-10-CM | POA: Insufficient documentation

## 2021-07-08 DIAGNOSIS — D539 Nutritional anemia, unspecified: Secondary | ICD-10-CM | POA: Diagnosis not present

## 2021-07-08 DIAGNOSIS — M1990 Unspecified osteoarthritis, unspecified site: Secondary | ICD-10-CM | POA: Insufficient documentation

## 2021-07-08 DIAGNOSIS — I252 Old myocardial infarction: Secondary | ICD-10-CM | POA: Diagnosis not present

## 2021-07-08 DIAGNOSIS — M879 Osteonecrosis, unspecified: Secondary | ICD-10-CM | POA: Diagnosis not present

## 2021-07-08 DIAGNOSIS — D696 Thrombocytopenia, unspecified: Secondary | ICD-10-CM | POA: Diagnosis not present

## 2021-07-08 DIAGNOSIS — Z883 Allergy status to other anti-infective agents status: Secondary | ICD-10-CM | POA: Diagnosis not present

## 2021-07-08 DIAGNOSIS — Z882 Allergy status to sulfonamides status: Secondary | ICD-10-CM | POA: Diagnosis not present

## 2021-07-08 DIAGNOSIS — Z885 Allergy status to narcotic agent status: Secondary | ICD-10-CM | POA: Diagnosis not present

## 2021-07-08 LAB — CMP (CANCER CENTER ONLY)
ALT: 21 U/L (ref 0–44)
AST: 44 U/L — ABNORMAL HIGH (ref 15–41)
Albumin: 2.9 g/dL — ABNORMAL LOW (ref 3.5–5.0)
Alkaline Phosphatase: 109 U/L (ref 38–126)
Anion gap: 9 (ref 5–15)
BUN: 13 mg/dL (ref 8–23)
CO2: 21 mmol/L — ABNORMAL LOW (ref 22–32)
Calcium: 9.2 mg/dL (ref 8.9–10.3)
Chloride: 111 mmol/L (ref 98–111)
Creatinine: 1.51 mg/dL — ABNORMAL HIGH (ref 0.61–1.24)
GFR, Estimated: 50 mL/min — ABNORMAL LOW (ref >60)
Glucose, Bld: 97 mg/dL (ref 70–99)
Potassium: 3.7 mmol/L (ref 3.5–5.1)
Sodium: 141 mmol/L (ref 135–145)
Total Bilirubin: 4.2 mg/dL (ref 0.3–1.2)
Total Protein: 7.5 g/dL (ref 6.5–8.1)

## 2021-07-08 LAB — RETIC PANEL
Immature Retic Fract: 25.6 % — ABNORMAL HIGH (ref 2.3–15.9)
RBC.: 2.53 MIL/uL — ABNORMAL LOW (ref 4.22–5.81)
Retic Count, Absolute: 88.3 10*3/uL (ref 19.0–186.0)
Retic Ct Pct: 3.5 % — ABNORMAL HIGH (ref 0.4–3.1)
Reticulocyte Hemoglobin: 35.8 pg (ref >27.9)

## 2021-07-08 LAB — CBC WITH DIFFERENTIAL (CANCER CENTER ONLY)
Abs Immature Granulocytes: 0.02 10*3/uL (ref 0.00–0.07)
Basophils Absolute: 0 10*3/uL (ref 0.0–0.1)
Basophils Relative: 1 %
Eosinophils Absolute: 0.2 10*3/uL (ref 0.0–0.5)
Eosinophils Relative: 5 %
HCT: 26.6 % — ABNORMAL LOW (ref 39.0–52.0)
Hemoglobin: 8.4 g/dL — ABNORMAL LOW (ref 13.0–17.0)
Immature Granulocytes: 1 %
Lymphocytes Relative: 18 %
Lymphs Abs: 0.7 10*3/uL (ref 0.7–4.0)
MCH: 32.6 pg (ref 26.0–34.0)
MCHC: 31.6 g/dL (ref 30.0–36.0)
MCV: 103.1 fL — ABNORMAL HIGH (ref 80.0–100.0)
Monocytes Absolute: 0.3 10*3/uL (ref 0.1–1.0)
Monocytes Relative: 8 %
Neutro Abs: 2.6 10*3/uL (ref 1.7–7.7)
Neutrophils Relative %: 67 %
Platelet Count: 66 10*3/uL — ABNORMAL LOW (ref 150–400)
RBC: 2.58 MIL/uL — ABNORMAL LOW (ref 4.22–5.81)
RDW: 17.4 % — ABNORMAL HIGH (ref 11.5–15.5)
WBC Count: 3.7 10*3/uL — ABNORMAL LOW (ref 4.0–10.5)
nRBC: 0 % (ref 0.0–0.2)

## 2021-07-08 LAB — FERRITIN: Ferritin: 496 ng/mL — ABNORMAL HIGH (ref 24–336)

## 2021-07-08 LAB — IRON AND TIBC
Iron: 97 ug/dL (ref 42–163)
Saturation Ratios: 42 % (ref 20–55)
TIBC: 229 ug/dL (ref 202–409)
UIBC: 132 ug/dL (ref 117–376)

## 2021-07-08 LAB — FOLATE: Folate: 10.6 ng/mL (ref >5.9)

## 2021-07-08 LAB — VITAMIN B12: Vitamin B-12: 1184 pg/mL — ABNORMAL HIGH (ref 180–914)

## 2021-07-08 LAB — LACTATE DEHYDROGENASE: LDH: 294 U/L — ABNORMAL HIGH (ref 98–192)

## 2021-07-08 NOTE — Telephone Encounter (Signed)
Critical Value received t bili 4.2 @ 11:11.  Message given to Sharlynn Oliphant, RN @ 11:13.
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
## 2021-07-11 ENCOUNTER — Encounter: Admission: RE | Payer: Self-pay | Source: Home / Self Care

## 2021-07-11 ENCOUNTER — Inpatient Hospital Stay: Admission: RE | Admit: 2021-07-11 | Payer: Medicare Other | Source: Home / Self Care | Admitting: Orthopedic Surgery

## 2021-07-11 LAB — KAPPA/LAMBDA LIGHT CHAINS
Kappa free light chain: 132.8 mg/L — ABNORMAL HIGH (ref 3.3–19.4)
Kappa, lambda light chain ratio: 0.94 (ref 0.26–1.65)
Lambda free light chains: 141 mg/L — ABNORMAL HIGH (ref 5.7–26.3)

## 2021-07-11 LAB — HOMOCYSTEINE: Homocysteine: 17.8 umol/L — ABNORMAL HIGH (ref 0.0–17.2)

## 2021-07-11 LAB — METHYLMALONIC ACID, SERUM: Methylmalonic Acid, Quantitative: 176 nmol/L (ref 0–378)

## 2021-07-11 SURGERY — TOTAL HIP REVISION
Anesthesia: Spinal | Site: Hip | Laterality: Right

## 2021-07-11 MED ORDER — ORAL CARE MOUTH RINSE: 15.0000 mL | Freq: Once | OROMUCOSAL | Status: DC

## 2021-07-11 MED ORDER — POVIDONE-IODINE 10 % EX SWAB: 2.0000 "application " | Freq: Once | CUTANEOUS | Status: DC

## 2021-07-11 MED ORDER — CHLORHEXIDINE GLUCONATE 0.12 % MT SOLN: 15.0000 mL | Freq: Once | OROMUCOSAL | Status: DC

## 2021-07-11 MED ORDER — CEFAZOLIN SODIUM-DEXTROSE 2-4 GM/100ML-% IV SOLN: 2.0000 g | INTRAVENOUS | Status: DC

## 2021-07-11 MED ORDER — LACTATED RINGERS IV SOLN: INTRAVENOUS | Status: DC

## 2021-07-11 MED ORDER — TRANEXAMIC ACID-NACL 1000-0.7 MG/100ML-% IV SOLN: 1000.0000 mg | INTRAVENOUS | Status: DC

## 2021-07-12 LAB — MULTIPLE MYELOMA PANEL, SERUM
Albumin SerPl Elph-Mcnc: 3.1 g/dL (ref 2.9–4.4)
Albumin/Glob SerPl: 0.8 (ref 0.7–1.7)
Alpha 1: 0.3 g/dL (ref 0.0–0.4)
Alpha2 Glob SerPl Elph-Mcnc: 0.4 g/dL (ref 0.4–1.0)
B-Globulin SerPl Elph-Mcnc: 1.2 g/dL (ref 0.7–1.3)
Gamma Glob SerPl Elph-Mcnc: 2.1 g/dL — ABNORMAL HIGH (ref 0.4–1.8)
Globulin, Total: 4 g/dL — ABNORMAL HIGH (ref 2.2–3.9)
IgA: 1095 mg/dL — ABNORMAL HIGH (ref 61–437)
IgG (Immunoglobin G), Serum: 2057 mg/dL — ABNORMAL HIGH (ref 603–1613)
IgM (Immunoglobulin M), Srm: 225 mg/dL — ABNORMAL HIGH (ref 20–172)
Total Protein ELP: 7.1 g/dL (ref 6.0–8.5)

## 2021-07-12 LAB — COPPER, SERUM: Copper: 85 ug/dL (ref 69–132)

## 2021-07-13 ENCOUNTER — Other Ambulatory Visit: Payer: Self-pay | Admitting: Hematology and Oncology

## 2021-07-13 ENCOUNTER — Telehealth: Payer: Self-pay | Admitting: *Deleted

## 2021-07-13 DIAGNOSIS — D539 Nutritional anemia, unspecified: Secondary | ICD-10-CM

## 2021-07-13 NOTE — Telephone Encounter (Signed)
-----  Message from Orson Slick, MD sent at 07/13/2021  9:02 AM EST ----- Please let Mr. Bieser know that his labs did not show a clear reason for his low Hemoglobin. His iron, b12, and folate are all normal. No other clear blood disorder was found. The next step to working up this finding would be a bone marrow biopsy. If he is agreeable we will schedule this ASAP to see if there is a bone marrow disorder causing his anemia.   ----- Message ----- From: Buel Ream, Lab [facility] [place] Sent: 07/08/2021  10:39 AM EST To: Orson Slick, MD

## 2021-07-13 NOTE — Telephone Encounter (Signed)
TCT patient regarding recent lab results.  Spoke with pt and advised that his lab results do not show a clear reason for his low HGB/platelet count.  Dr. Lorenso Courier recommends a bone marrow biopsy for mor definitive answers. Pt is agreeable to this . Dr. Lorenso Courier to place order for bone marrow biopsy

## 2021-07-14 ENCOUNTER — Telehealth: Payer: Self-pay | Admitting: *Deleted

## 2021-07-14 NOTE — Telephone Encounter (Signed)
Received call from pt's sister regarding upcoming Bone Marrow bx.  She is asking if it can be done sooner than 08/04/21. Advised that I would call radiology scheduling to see if there is a way to do this sooner. Advised they scheduled as openings occur.. Advised that I would try [facility] to see if they had any earlier openings.  She voiced understanding.

## 2021-07-18 HISTORY — PX: BONE MARROW BIOPSY: SHX199

## 2021-07-21 ENCOUNTER — Other Ambulatory Visit: Payer: Self-pay | Admitting: Radiology

## 2021-07-22 ENCOUNTER — Other Ambulatory Visit: Payer: Self-pay

## 2021-07-22 ENCOUNTER — Ambulatory Visit
Admission: RE | Admit: 2021-07-22 | Discharge: 2021-07-22 | Disposition: A | Discharge status: Home / Self Care | Payer: Medicare Other | Source: Ambulatory Visit | Attending: Hematology and Oncology | Admitting: Hematology and Oncology

## 2021-07-22 DIAGNOSIS — Z87891 Personal history of nicotine dependence: Secondary | ICD-10-CM | POA: Insufficient documentation

## 2021-07-22 DIAGNOSIS — R161 Splenomegaly, not elsewhere classified: Secondary | ICD-10-CM | POA: Diagnosis not present

## 2021-07-22 DIAGNOSIS — D696 Thrombocytopenia, unspecified: Secondary | ICD-10-CM | POA: Diagnosis not present

## 2021-07-22 DIAGNOSIS — D61818 Other pancytopenia: Secondary | ICD-10-CM | POA: Diagnosis not present

## 2021-07-22 DIAGNOSIS — D539 Nutritional anemia, unspecified: Secondary | ICD-10-CM | POA: Insufficient documentation

## 2021-07-22 DIAGNOSIS — I252 Old myocardial infarction: Secondary | ICD-10-CM | POA: Insufficient documentation

## 2021-07-22 DIAGNOSIS — J449 Chronic obstructive pulmonary disease, unspecified: Secondary | ICD-10-CM | POA: Diagnosis not present

## 2021-07-22 DIAGNOSIS — D469 Myelodysplastic syndrome, unspecified: Secondary | ICD-10-CM | POA: Diagnosis not present

## 2021-07-22 LAB — CBC WITH DIFFERENTIAL/PLATELET
Abs Immature Granulocytes: 0.01 10*3/uL (ref 0.00–0.07)
Basophils Absolute: 0 10*3/uL (ref 0.0–0.1)
Basophils Relative: 1 %
Eosinophils Absolute: 0.1 10*3/uL (ref 0.0–0.5)
Eosinophils Relative: 4 %
HCT: 24.4 % — ABNORMAL LOW (ref 39.0–52.0)
Hemoglobin: 7.8 g/dL — ABNORMAL LOW (ref 13.0–17.0)
Immature Granulocytes: 0 %
Lymphocytes Relative: 21 %
Lymphs Abs: 0.7 10*3/uL (ref 0.7–4.0)
MCH: 33.2 pg (ref 26.0–34.0)
MCHC: 32 g/dL (ref 30.0–36.0)
MCV: 103.8 fL — ABNORMAL HIGH (ref 80.0–100.0)
Monocytes Absolute: 0.4 10*3/uL (ref 0.1–1.0)
Monocytes Relative: 11 %
Neutro Abs: 2.1 10*3/uL (ref 1.7–7.7)
Neutrophils Relative %: 63 %
Platelets: 77 10*3/uL — ABNORMAL LOW (ref 150–400)
RBC: 2.35 MIL/uL — ABNORMAL LOW (ref 4.22–5.81)
RDW: 17.8 % — ABNORMAL HIGH (ref 11.5–15.5)
WBC: 3.3 10*3/uL — ABNORMAL LOW (ref 4.0–10.5)
nRBC: 0 % (ref 0.0–0.2)

## 2021-07-22 MED ORDER — MIDAZOLAM HCL 2 MG/2ML IJ SOLN
INTRAMUSCULAR | Status: AC | PRN
  Administered 2021-07-22: 1 mg via INTRAVENOUS

## 2021-07-22 MED ORDER — HEPARIN SOD (PORK) LOCK FLUSH 100 UNIT/ML IV SOLN
INTRAVENOUS | Status: AC
  Filled 2021-07-22: qty 5

## 2021-07-22 MED ORDER — MIDAZOLAM HCL 5 MG/5ML IJ SOLN
INTRAMUSCULAR | Status: AC | PRN
  Administered 2021-07-22: 1 mg via INTRAVENOUS

## 2021-07-22 MED ORDER — SODIUM CHLORIDE 0.9 % IV SOLN: INTRAVENOUS | Status: DC

## 2021-07-22 MED ORDER — FENTANYL CITRATE (PF) 100 MCG/2ML IJ SOLN
INTRAMUSCULAR | Status: AC | PRN
Start: 2021-07-22 — End: 2021-07-22
  Administered 2021-07-22 (×2): 50 ug via INTRAVENOUS

## 2021-07-22 MED ORDER — FENTANYL CITRATE (PF) 100 MCG/2ML IJ SOLN
INTRAMUSCULAR | Status: AC
  Filled 2021-07-22: qty 2

## 2021-07-22 MED ORDER — MIDAZOLAM HCL 2 MG/2ML IJ SOLN
INTRAMUSCULAR | Status: AC
  Filled 2021-07-22: qty 2

## 2021-07-22 NOTE — Procedures (Signed)
Vascular and Interventional Radiology Procedure Note  Patient: [name] DOB: August 01, 1956 Medical Record Number: [id number] Note Date/Time: 07/22/21 9:37 AM   Performing Physician: Michaelle Birks, MD Assistant(s): None  Diagnosis: Thrombocytopenia  Procedure: BONE MARROW ASPIRATION and BIOPSY  Anesthesia: Conscious Sedation Complications: None Estimated Blood Loss: Minimal Specimens: Sent for Pathology  Findings:  Successful CT-guided bone marrow biopsy A total of 2 cores were obtained. Hemostasis of the tract was achieved using Manual Pressure.  Plan: Bed rest for 2 hours.  See detailed procedure note with images in PACS. The patient tolerated the procedure well without incident or complication and was returned to Recovery in stable condition.    Michaelle Birks, MD [facility]   Pager. [place]
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
## 2021-07-22 NOTE — Progress Notes (Signed)
Patient clinically stable post BMB per Dr Maryelizabeth Kaufmann, tolerated well. Received Versed 2 mg along with Fentanyl 100 mcg IV for procedure. Sister to bedside post procedure with update given. Report given to Normal  in specials post procedure.

## 2021-07-29 ENCOUNTER — Other Ambulatory Visit: Payer: Self-pay | Admitting: *Deleted

## 2021-07-29 DIAGNOSIS — D539 Nutritional anemia, unspecified: Secondary | ICD-10-CM

## 2021-08-01 ENCOUNTER — Encounter: Payer: Self-pay | Admitting: Hematology and Oncology

## 2021-08-01 ENCOUNTER — Telehealth: Payer: Self-pay

## 2021-08-01 ENCOUNTER — Inpatient Hospital Stay: Payer: Medicare Other | Attending: Hematology and Oncology

## 2021-08-01 ENCOUNTER — Encounter (HOSPITAL_COMMUNITY): Payer: Self-pay | Admitting: Hematology and Oncology

## 2021-08-01 ENCOUNTER — Inpatient Hospital Stay (HOSPITAL_BASED_OUTPATIENT_CLINIC_OR_DEPARTMENT_OTHER): Payer: Medicare Other | Admitting: Hematology and Oncology

## 2021-08-01 VITALS — BP 157/89 | HR 122 | Temp 97.5°F | Resp 17 | Ht 68.0 in | Wt 142.6 lb

## 2021-08-01 DIAGNOSIS — D696 Thrombocytopenia, unspecified: Secondary | ICD-10-CM | POA: Insufficient documentation

## 2021-08-01 DIAGNOSIS — D649 Anemia, unspecified: Secondary | ICD-10-CM | POA: Diagnosis not present

## 2021-08-01 DIAGNOSIS — D4620 Refractory anemia with excess of blasts, unspecified: Secondary | ICD-10-CM | POA: Diagnosis not present

## 2021-08-01 DIAGNOSIS — K219 Gastro-esophageal reflux disease without esophagitis: Secondary | ICD-10-CM | POA: Diagnosis not present

## 2021-08-01 DIAGNOSIS — F419 Anxiety disorder, unspecified: Secondary | ICD-10-CM | POA: Diagnosis not present

## 2021-08-01 DIAGNOSIS — J449 Chronic obstructive pulmonary disease, unspecified: Secondary | ICD-10-CM | POA: Diagnosis not present

## 2021-08-01 DIAGNOSIS — D539 Nutritional anemia, unspecified: Secondary | ICD-10-CM | POA: Diagnosis not present

## 2021-08-01 DIAGNOSIS — I252 Old myocardial infarction: Secondary | ICD-10-CM | POA: Diagnosis not present

## 2021-08-01 DIAGNOSIS — D469 Myelodysplastic syndrome, unspecified: Secondary | ICD-10-CM | POA: Insufficient documentation

## 2021-08-01 DIAGNOSIS — Z79899 Other long term (current) drug therapy: Secondary | ICD-10-CM | POA: Diagnosis not present

## 2021-08-01 LAB — CBC WITH DIFFERENTIAL (CANCER CENTER ONLY)
Abs Immature Granulocytes: 0.01 10*3/uL (ref 0.00–0.07)
Basophils Absolute: 0 10*3/uL (ref 0.0–0.1)
Basophils Relative: 1 %
Eosinophils Absolute: 0.2 10*3/uL (ref 0.0–0.5)
Eosinophils Relative: 5 %
HCT: 25.9 % — ABNORMAL LOW (ref 39.0–52.0)
Hemoglobin: 8.1 g/dL — ABNORMAL LOW (ref 13.0–17.0)
Immature Granulocytes: 0 %
Lymphocytes Relative: 19 %
Lymphs Abs: 0.7 10*3/uL (ref 0.7–4.0)
MCH: 32.4 pg (ref 26.0–34.0)
MCHC: 31.3 g/dL (ref 30.0–36.0)
MCV: 103.6 fL — ABNORMAL HIGH (ref 80.0–100.0)
Monocytes Absolute: 0.4 10*3/uL (ref 0.1–1.0)
Monocytes Relative: 10 %
Neutro Abs: 2.6 10*3/uL (ref 1.7–7.7)
Neutrophils Relative %: 65 %
Platelet Count: 59 10*3/uL — ABNORMAL LOW (ref 150–400)
RBC: 2.5 MIL/uL — ABNORMAL LOW (ref 4.22–5.81)
RDW: 18.6 % — ABNORMAL HIGH (ref 11.5–15.5)
WBC Count: 3.9 10*3/uL — ABNORMAL LOW (ref 4.0–10.5)
nRBC: 0 % (ref 0.0–0.2)

## 2021-08-01 LAB — CMP (CANCER CENTER ONLY)
ALT: 20 U/L (ref 0–44)
AST: 49 U/L — ABNORMAL HIGH (ref 15–41)
Albumin: 2.7 g/dL — ABNORMAL LOW (ref 3.5–5.0)
Alkaline Phosphatase: 113 U/L (ref 38–126)
Anion gap: 8 (ref 5–15)
BUN: 11 mg/dL (ref 8–23)
CO2: 22 mmol/L (ref 22–32)
Calcium: 8.3 mg/dL — ABNORMAL LOW (ref 8.9–10.3)
Chloride: 111 mmol/L (ref 98–111)
Creatinine: 1.45 mg/dL — ABNORMAL HIGH (ref 0.61–1.24)
GFR, Estimated: 53 mL/min — ABNORMAL LOW (ref >60)
Glucose, Bld: 90 mg/dL (ref 70–99)
Potassium: 4 mmol/L (ref 3.5–5.1)
Sodium: 141 mmol/L (ref 135–145)
Total Bilirubin: 4.2 mg/dL (ref 0.3–1.2)
Total Protein: 7.2 g/dL (ref 6.5–8.1)
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
## 2021-08-01 NOTE — Telephone Encounter (Signed)
CRITICAL VALUE STICKER  CRITICAL VALUE: Total Bili = 4.2  RECEIVER (on-site recipient of call): Yetta Glassman, [place] NOTIFIED: 08/01/21 at 2:06pm  MESSENGER (representative from lab): Ulice Dash  MD NOTIFIED: Lorenso Courier  TIME OF NOTIFICATION: 08/01/21 at 2:10pm  RESPONSE: Notification given to [facility], LPN for follow-up with pt and provider.

## 2021-08-04 ENCOUNTER — Other Ambulatory Visit: Payer: Self-pay

## 2021-08-04 ENCOUNTER — Ambulatory Visit (HOSPITAL_COMMUNITY): Payer: Medicare Other

## 2021-08-04 ENCOUNTER — Inpatient Hospital Stay: Payer: Medicare Other | Attending: Hematology and Oncology

## 2021-08-04 VITALS — BP 151/80 | HR 105 | Temp 98.0°F | Resp 17

## 2021-08-04 DIAGNOSIS — D464 Refractory anemia, unspecified: Secondary | ICD-10-CM | POA: Insufficient documentation

## 2021-08-04 DIAGNOSIS — D696 Thrombocytopenia, unspecified: Secondary | ICD-10-CM | POA: Diagnosis not present

## 2021-08-04 DIAGNOSIS — Z79899 Other long term (current) drug therapy: Secondary | ICD-10-CM | POA: Insufficient documentation

## 2021-08-04 DIAGNOSIS — D4620 Refractory anemia with excess of blasts, unspecified: Secondary | ICD-10-CM

## 2021-08-04 MED ORDER — EPOETIN ALFA-EPBX 40000 UNIT/ML IJ SOLN
40000.0000 [IU] | Freq: Once | INTRAMUSCULAR | Status: AC
  Administered 2021-08-04: 10:00:00 40000 [IU] via SUBCUTANEOUS
  Filled 2021-08-04: qty 1

## 2021-08-04 NOTE — Patient Instructions (Signed)
Epoetin Alfa injection °What is this medication? °EPOETIN ALFA (e POE e tin AL fa) helps your body make more red blood cells. This medicine is used to treat anemia caused by chronic kidney disease, cancer chemotherapy, or HIV-therapy. It may also be used before surgery if you have anemia. °This medicine may be used for other purposes; ask your health care provider or pharmacist if you have questions. °COMMON BRAND NAME(S): Epogen, Procrit, Retacrit °What should I tell my care team before I take this medication? °They need to know if you have any of these conditions: °cancer °heart disease °high blood pressure °history of blood clots °history of stroke °low levels of folate, iron, or vitamin B12 in the blood °seizures °an unusual or allergic reaction to erythropoietin, albumin, benzyl alcohol, hamster proteins, other medicines, foods, dyes, or preservatives °pregnant or trying to get pregnant °breast-feeding °How should I use this medication? °This medicine is for injection into a vein or under the skin. It is usually given by a health care professional in a hospital or clinic setting. °If you get this medicine at home, you will be taught how to prepare and give this medicine. Use exactly as directed. Take your medicine at regular intervals. Do not take your medicine more often than directed. °It is important that you put your used needles and syringes in a special sharps container. Do not put them in a trash can. If you do not have a sharps container, call your pharmacist or healthcare provider to get one. °A special MedGuide will be given to you by the pharmacist with each prescription and refill. Be sure to read this information carefully each time. °Talk to your pediatrician regarding the use of this medicine in children. While this drug may be prescribed for selected conditions, precautions do apply. °Overdosage: If you think you have taken too much of this medicine contact a poison control center or emergency  room at once. °NOTE: This medicine is only for you. Do not share this medicine with others. °What if I miss a dose? °If you miss a dose, take it as soon as you can. If it is almost time for your next dose, take only that dose. Do not take double or extra doses. °What may interact with this medication? °Interactions have not been studied. °This list may not describe all possible interactions. Give your health care provider a list of all the medicines, herbs, non-prescription drugs, or dietary supplements you use. Also tell them if you smoke, drink alcohol, or use illegal drugs. Some items may interact with your medicine. °What should I watch for while using this medication? °Your condition will be monitored carefully while you are receiving this medicine. °You may need blood work done while you are taking this medicine. °This medicine may cause a decrease in vitamin B6. You should make sure that you get enough vitamin B6 while you are taking this medicine. Discuss the foods you eat and the vitamins you take with your health care professional. °What side effects may I notice from receiving this medication? °Side effects that you should report to your doctor or health care professional as soon as possible: °allergic reactions like skin rash, itching or hives, swelling of the face, lips, or tongue °seizures °signs and symptoms of a blood clot such as breathing problems; changes in vision; chest pain; severe, sudden headache; pain, swelling, warmth in the leg; trouble speaking; sudden numbness or weakness of the face, arm or leg °signs and symptoms of a stroke like   changes in vision; confusion; trouble speaking or understanding; severe headaches; sudden numbness or weakness of the face, arm or leg; trouble walking; dizziness; loss of balance or coordination °Side effects that usually do not require medical attention (report to your doctor or health care professional if they continue or are  bothersome): °chills °cough °dizziness °fever °headaches °joint pain °muscle cramps °muscle pain °nausea, vomiting °pain, redness, or irritation at site where injected °This list may not describe all possible side effects. Call your doctor for medical advice about side effects. You may report side effects to FDA at 1-800-FDA-1088. °Where should I keep my medication? °Keep out of the reach of children. °Store in a refrigerator between 2 and 8 degrees C (36 and 46 degrees F). Do not freeze or shake. Throw away any unused portion if using a single-dose vial. Multi-dose vials can be kept in the refrigerator for up to 21 days after the initial dose. Throw away unused medicine. °NOTE: This sheet is a summary. It may not cover all possible information. If you have questions about this medicine, talk to your doctor, pharmacist, or health care provider. °© 2022 [employer] (2017-04-03 00:00:00) ° °

## 2021-08-05 ENCOUNTER — Encounter (HOSPITAL_COMMUNITY): Payer: Self-pay | Admitting: Hematology and Oncology

## 2021-08-09 ENCOUNTER — Encounter: Payer: Self-pay | Admitting: Hematology and Oncology

## 2021-08-18 ENCOUNTER — Other Ambulatory Visit: Payer: Self-pay | Admitting: Physician Assistant

## 2021-08-18 DIAGNOSIS — D4620 Refractory anemia with excess of blasts, unspecified: Secondary | ICD-10-CM

## 2021-08-19 ENCOUNTER — Other Ambulatory Visit: Payer: Self-pay

## 2021-08-19 ENCOUNTER — Inpatient Hospital Stay (HOSPITAL_BASED_OUTPATIENT_CLINIC_OR_DEPARTMENT_OTHER): Payer: Medicare Other | Admitting: Physician Assistant

## 2021-08-19 ENCOUNTER — Telehealth: Payer: Self-pay

## 2021-08-19 ENCOUNTER — Inpatient Hospital Stay: Payer: Medicare Other | Attending: Hematology and Oncology

## 2021-08-19 ENCOUNTER — Inpatient Hospital Stay: Payer: Medicare Other

## 2021-08-19 VITALS — BP 149/90 | HR 103 | Temp 98.1°F | Resp 17 | Wt 147.6 lb

## 2021-08-19 DIAGNOSIS — D469 Myelodysplastic syndrome, unspecified: Secondary | ICD-10-CM | POA: Insufficient documentation

## 2021-08-19 DIAGNOSIS — D696 Thrombocytopenia, unspecified: Secondary | ICD-10-CM | POA: Insufficient documentation

## 2021-08-19 DIAGNOSIS — E8809 Other disorders of plasma-protein metabolism, not elsewhere classified: Secondary | ICD-10-CM | POA: Diagnosis not present

## 2021-08-19 DIAGNOSIS — D464 Refractory anemia, unspecified: Secondary | ICD-10-CM | POA: Diagnosis not present

## 2021-08-19 DIAGNOSIS — D4620 Refractory anemia with excess of blasts, unspecified: Secondary | ICD-10-CM

## 2021-08-19 DIAGNOSIS — M25559 Pain in unspecified hip: Secondary | ICD-10-CM | POA: Insufficient documentation

## 2021-08-19 DIAGNOSIS — R5383 Other fatigue: Secondary | ICD-10-CM | POA: Diagnosis not present

## 2021-08-19 DIAGNOSIS — D539 Nutritional anemia, unspecified: Secondary | ICD-10-CM | POA: Diagnosis not present

## 2021-08-19 DIAGNOSIS — R161 Splenomegaly, not elsewhere classified: Secondary | ICD-10-CM | POA: Insufficient documentation

## 2021-08-19 DIAGNOSIS — K7460 Unspecified cirrhosis of liver: Secondary | ICD-10-CM | POA: Diagnosis not present

## 2021-08-19 DIAGNOSIS — Z87891 Personal history of nicotine dependence: Secondary | ICD-10-CM | POA: Diagnosis not present

## 2021-08-19 DIAGNOSIS — R351 Nocturia: Secondary | ICD-10-CM | POA: Diagnosis not present

## 2021-08-19 DIAGNOSIS — Z79899 Other long term (current) drug therapy: Secondary | ICD-10-CM | POA: Diagnosis not present

## 2021-08-19 DIAGNOSIS — R600 Localized edema: Secondary | ICD-10-CM | POA: Diagnosis not present

## 2021-08-19 DIAGNOSIS — G8929 Other chronic pain: Secondary | ICD-10-CM | POA: Diagnosis not present

## 2021-08-19 LAB — CBC WITH DIFFERENTIAL (CANCER CENTER ONLY)
Abs Immature Granulocytes: 0.01 10*3/uL (ref 0.00–0.07)
Basophils Absolute: 0 10*3/uL (ref 0.0–0.1)
Basophils Relative: 1 %
Eosinophils Absolute: 0.1 10*3/uL (ref 0.0–0.5)
Eosinophils Relative: 3 %
HCT: 28.2 % — ABNORMAL LOW (ref 39.0–52.0)
Hemoglobin: 9 g/dL — ABNORMAL LOW (ref 13.0–17.0)
Immature Granulocytes: 0 %
Lymphocytes Relative: 24 %
Lymphs Abs: 0.9 10*3/uL (ref 0.7–4.0)
MCH: 32.4 pg (ref 26.0–34.0)
MCHC: 31.9 g/dL (ref 30.0–36.0)
MCV: 101.4 fL — ABNORMAL HIGH (ref 80.0–100.0)
Monocytes Absolute: 0.4 10*3/uL (ref 0.1–1.0)
Monocytes Relative: 11 %
Neutro Abs: 2.2 10*3/uL (ref 1.7–7.7)
Neutrophils Relative %: 61 %
Platelet Count: 79 10*3/uL — ABNORMAL LOW (ref 150–400)
RBC: 2.78 MIL/uL — ABNORMAL LOW (ref 4.22–5.81)
RDW: 18.6 % — ABNORMAL HIGH (ref 11.5–15.5)
WBC Count: 3.6 10*3/uL — ABNORMAL LOW (ref 4.0–10.5)
nRBC: 0 % (ref 0.0–0.2)

## 2021-08-19 LAB — CMP (CANCER CENTER ONLY)
ALT: 16 U/L (ref 0–44)
AST: 39 U/L (ref 15–41)
Albumin: 2.9 g/dL — ABNORMAL LOW (ref 3.5–5.0)
Alkaline Phosphatase: 87 U/L (ref 38–126)
Anion gap: 8 (ref 5–15)
BUN: 14 mg/dL (ref 8–23)
CO2: 24 mmol/L (ref 22–32)
Calcium: 9.5 mg/dL (ref 8.9–10.3)
Chloride: 107 mmol/L (ref 98–111)
Creatinine: 1.51 mg/dL — ABNORMAL HIGH (ref 0.61–1.24)
GFR, Estimated: 50 mL/min — ABNORMAL LOW (ref >60)
Glucose, Bld: 87 mg/dL (ref 70–99)
Potassium: 3.7 mmol/L (ref 3.5–5.1)
Sodium: 139 mmol/L (ref 135–145)
Total Bilirubin: 4.8 mg/dL (ref 0.3–1.2)
Total Protein: 7 g/dL (ref 6.5–8.1)

## 2021-08-19 LAB — SAMPLE TO BLOOD BANK

## 2021-08-19 MED ORDER — EPOETIN ALFA-EPBX 40000 UNIT/ML IJ SOLN
40000.0000 [IU] | Freq: Once | INTRAMUSCULAR | Status: AC
  Administered 2021-08-19: 40000 [IU] via SUBCUTANEOUS
  Filled 2021-08-19: qty 1

## 2021-08-19 NOTE — Patient Instructions (Signed)
Epoetin Alfa injection °What is this medication? °EPOETIN ALFA (e POE e tin AL fa) helps your body make more red blood cells. This medicine is used to treat anemia caused by chronic kidney disease, cancer chemotherapy, or HIV-therapy. It may also be used before surgery if you have anemia. °This medicine may be used for other purposes; ask your health care provider or pharmacist if you have questions. °COMMON BRAND NAME(S): Epogen, Procrit, Retacrit °What should I tell my care team before I take this medication? °They need to know if you have any of these conditions: °cancer °heart disease °high blood pressure °history of blood clots °history of stroke °low levels of folate, iron, or vitamin B12 in the blood °seizures °an unusual or allergic reaction to erythropoietin, albumin, benzyl alcohol, hamster proteins, other medicines, foods, dyes, or preservatives °pregnant or trying to get pregnant °breast-feeding °How should I use this medication? °This medicine is for injection into a vein or under the skin. It is usually given by a health care professional in a hospital or clinic setting. °If you get this medicine at home, you will be taught how to prepare and give this medicine. Use exactly as directed. Take your medicine at regular intervals. Do not take your medicine more often than directed. °It is important that you put your used needles and syringes in a special sharps container. Do not put them in a trash can. If you do not have a sharps container, call your pharmacist or healthcare provider to get one. °A special MedGuide will be given to you by the pharmacist with each prescription and refill. Be sure to read this information carefully each time. °Talk to your pediatrician regarding the use of this medicine in children. While this drug may be prescribed for selected conditions, precautions do apply. °Overdosage: If you think you have taken too much of this medicine contact a poison control center or emergency  room at once. °NOTE: This medicine is only for you. Do not share this medicine with others. °What if I miss a dose? °If you miss a dose, take it as soon as you can. If it is almost time for your next dose, take only that dose. Do not take double or extra doses. °What may interact with this medication? °Interactions have not been studied. °This list may not describe all possible interactions. Give your health care provider a list of all the medicines, herbs, non-prescription drugs, or dietary supplements you use. Also tell them if you smoke, drink alcohol, or use illegal drugs. Some items may interact with your medicine. °What should I watch for while using this medication? °Your condition will be monitored carefully while you are receiving this medicine. °You may need blood work done while you are taking this medicine. °This medicine may cause a decrease in vitamin B6. You should make sure that you get enough vitamin B6 while you are taking this medicine. Discuss the foods you eat and the vitamins you take with your health care professional. °What side effects may I notice from receiving this medication? °Side effects that you should report to your doctor or health care professional as soon as possible: °allergic reactions like skin rash, itching or hives, swelling of the face, lips, or tongue °seizures °signs and symptoms of a blood clot such as breathing problems; changes in vision; chest pain; severe, sudden headache; pain, swelling, warmth in the leg; trouble speaking; sudden numbness or weakness of the face, arm or leg °signs and symptoms of a stroke like   changes in vision; confusion; trouble speaking or understanding; severe headaches; sudden numbness or weakness of the face, arm or leg; trouble walking; dizziness; loss of balance or coordination °Side effects that usually do not require medical attention (report to your doctor or health care professional if they continue or are  bothersome): °chills °cough °dizziness °fever °headaches °joint pain °muscle cramps °muscle pain °nausea, vomiting °pain, redness, or irritation at site where injected °This list may not describe all possible side effects. Call your doctor for medical advice about side effects. You may report side effects to FDA at 1-800-FDA-1088. °Where should I keep my medication? °Keep out of the reach of children. °Store in a refrigerator between 2 and 8 degrees C (36 and 46 degrees F). Do not freeze or shake. Throw away any unused portion if using a single-dose vial. Multi-dose vials can be kept in the refrigerator for up to 21 days after the initial dose. Throw away unused medicine. °NOTE: This sheet is a summary. It may not cover all possible information. If you have questions about this medicine, talk to your doctor, pharmacist, or health care provider. °© 2022 [employer] (2017-04-03 00:00:00) ° °

## 2021-08-19 NOTE — Telephone Encounter (Signed)
CRITICAL VALUE STICKER  CRITICAL VALUE: Total Bili = 4.8  RECEIVER (on-site recipient of call): Yetta Glassman, CMA  DATE & TIME NOTIFIED: 08/19/21 at 1:44pm  MESSENGER (representative from lab): Suanne Marker  MD NOTIFIED: Lorenso Courier  TIME OF NOTIFICATION: 08/19/21 at 1:50pm  RESPONSE: Notification given to Joesphine Bare., RN for follow-up with pt and provider.

## 2021-08-22 ENCOUNTER — Telehealth: Payer: Self-pay | Admitting: Physician Assistant

## 2021-08-22 NOTE — Telephone Encounter (Signed)
Scheduled per 1/6 los, pt has been called and confirmed appt change

## 2021-08-23 LAB — ERYTHROPOIETIN: Erythropoietin: 24.7 m[IU]/mL — ABNORMAL HIGH (ref 2.6–18.5)

## 2021-08-24 ENCOUNTER — Encounter: Payer: Self-pay | Admitting: Hematology and Oncology
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
## 2021-08-30 ENCOUNTER — Inpatient Hospital Stay: Payer: Medicare Other

## 2021-08-30 ENCOUNTER — Inpatient Hospital Stay (HOSPITAL_BASED_OUTPATIENT_CLINIC_OR_DEPARTMENT_OTHER): Payer: Medicare Other | Admitting: Hematology and Oncology

## 2021-08-30 ENCOUNTER — Other Ambulatory Visit: Payer: Self-pay | Admitting: Hematology and Oncology

## 2021-08-30 ENCOUNTER — Telehealth: Payer: Self-pay

## 2021-08-30 ENCOUNTER — Other Ambulatory Visit: Payer: Self-pay

## 2021-08-30 VITALS — BP 161/85 | HR 103 | Temp 97.9°F | Resp 18 | Ht 68.0 in | Wt 146.3 lb

## 2021-08-30 DIAGNOSIS — D4620 Refractory anemia with excess of blasts, unspecified: Secondary | ICD-10-CM

## 2021-08-30 DIAGNOSIS — Z79899 Other long term (current) drug therapy: Secondary | ICD-10-CM | POA: Diagnosis not present

## 2021-08-30 DIAGNOSIS — K7460 Unspecified cirrhosis of liver: Secondary | ICD-10-CM | POA: Diagnosis not present

## 2021-08-30 DIAGNOSIS — D649 Anemia, unspecified: Secondary | ICD-10-CM | POA: Diagnosis not present

## 2021-08-30 DIAGNOSIS — D696 Thrombocytopenia, unspecified: Secondary | ICD-10-CM | POA: Diagnosis not present

## 2021-08-30 DIAGNOSIS — R600 Localized edema: Secondary | ICD-10-CM | POA: Diagnosis not present

## 2021-08-30 DIAGNOSIS — D539 Nutritional anemia, unspecified: Secondary | ICD-10-CM | POA: Diagnosis not present

## 2021-08-30 DIAGNOSIS — D464 Refractory anemia, unspecified: Secondary | ICD-10-CM | POA: Diagnosis not present

## 2021-08-30 LAB — CBC WITH DIFFERENTIAL (CANCER CENTER ONLY)
Abs Immature Granulocytes: 0.01 10*3/uL (ref 0.00–0.07)
Basophils Absolute: 0 10*3/uL (ref 0.0–0.1)
Basophils Relative: 1 %
Eosinophils Absolute: 0.1 10*3/uL (ref 0.0–0.5)
Eosinophils Relative: 3 %
HCT: 29.4 % — ABNORMAL LOW (ref 39.0–52.0)
Hemoglobin: 9.1 g/dL — ABNORMAL LOW (ref 13.0–17.0)
Immature Granulocytes: 0 %
Lymphocytes Relative: 19 %
Lymphs Abs: 0.7 10*3/uL (ref 0.7–4.0)
MCH: 31.6 pg (ref 26.0–34.0)
MCHC: 31 g/dL (ref 30.0–36.0)
MCV: 102.1 fL — ABNORMAL HIGH (ref 80.0–100.0)
Monocytes Absolute: 0.3 10*3/uL (ref 0.1–1.0)
Monocytes Relative: 9 %
Neutro Abs: 2.5 10*3/uL (ref 1.7–7.7)
Neutrophils Relative %: 68 %
Platelet Count: 71 10*3/uL — ABNORMAL LOW (ref 150–400)
RBC: 2.88 MIL/uL — ABNORMAL LOW (ref 4.22–5.81)
RDW: 19.6 % — ABNORMAL HIGH (ref 11.5–15.5)
WBC Count: 3.6 10*3/uL — ABNORMAL LOW (ref 4.0–10.5)
nRBC: 0 % (ref 0.0–0.2)

## 2021-08-30 LAB — CMP (CANCER CENTER ONLY)
ALT: 18 U/L (ref 0–44)
AST: 43 U/L — ABNORMAL HIGH (ref 15–41)
Albumin: 2.9 g/dL — ABNORMAL LOW (ref 3.5–5.0)
Alkaline Phosphatase: 126 U/L (ref 38–126)
Anion gap: 7 (ref 5–15)
BUN: 16 mg/dL (ref 8–23)
CO2: 24 mmol/L (ref 22–32)
Calcium: 9.2 mg/dL (ref 8.9–10.3)
Chloride: 110 mmol/L (ref 98–111)
Creatinine: 1.58 mg/dL — ABNORMAL HIGH (ref 0.61–1.24)
GFR, Estimated: 48 mL/min — ABNORMAL LOW (ref >60)
Glucose, Bld: 116 mg/dL — ABNORMAL HIGH (ref 70–99)
Potassium: 3.7 mmol/L (ref 3.5–5.1)
Sodium: 141 mmol/L (ref 135–145)
Total Bilirubin: 4.2 mg/dL (ref 0.3–1.2)
Total Protein: 7 g/dL (ref 6.5–8.1)

## 2021-08-30 LAB — BILIRUBIN, DIRECT: Bilirubin, Direct: 1.2 mg/dL — ABNORMAL HIGH (ref 0.0–0.2)

## 2021-08-30 MED ORDER — EPOETIN ALFA-EPBX 20000 UNIT/ML IJ SOLN
20000.0000 [IU] | Freq: Once | INTRAMUSCULAR | Status: AC
  Administered 2021-08-30: 20000 [IU] via SUBCUTANEOUS
  Filled 2021-08-30: qty 1

## 2021-08-30 MED ORDER — EPOETIN ALFA-EPBX 20000 UNIT/ML IJ SOLN: 60000.0000 [IU] | Freq: Once | INTRAMUSCULAR | Status: DC

## 2021-08-30 MED ORDER — EPOETIN ALFA-EPBX 40000 UNIT/ML IJ SOLN
40000.0000 [IU] | Freq: Once | INTRAMUSCULAR | Status: AC
  Administered 2021-08-30: 40000 [IU] via SUBCUTANEOUS
  Filled 2021-08-30: qty 1
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
## 2021-08-30 NOTE — Telephone Encounter (Signed)
CRITICAL VALUE STICKER  CRITICAL VALUE: Total Bili = 4.2  RECEIVER (on-site recipient of call): Yetta Glassman, CMA  DATE & TIME NOTIFIED: 08/30/21 at 10:50am  MESSENGER (representative from lab): Hillary  MD NOTIFIED: DORSEY  TIME OF NOTIFICATION: 08/30/21 at 10:57am  RESPONSE: Notification given to Thu, RN for follow-up with provider and pt.

## 2021-08-31 ENCOUNTER — Telehealth: Payer: Self-pay | Admitting: Hematology and Oncology

## 2021-08-31 LAB — SURGICAL PATHOLOGY

## 2021-08-31 NOTE — Telephone Encounter (Signed)
Scheduled per 1/17 los, pt has been called and confirmed

## 2021-09-01 ENCOUNTER — Inpatient Hospital Stay: Payer: Medicare Other

## 2021-09-09 DIAGNOSIS — F331 Major depressive disorder, recurrent, moderate: Secondary | ICD-10-CM | POA: Diagnosis not present

## 2021-09-09 DIAGNOSIS — Z79899 Other long term (current) drug therapy: Secondary | ICD-10-CM | POA: Diagnosis not present

## 2021-09-14 ENCOUNTER — Other Ambulatory Visit: Payer: Self-pay

## 2021-09-14 ENCOUNTER — Inpatient Hospital Stay: Payer: Medicare Other | Attending: Hematology and Oncology

## 2021-09-14 ENCOUNTER — Telehealth: Payer: Self-pay

## 2021-09-14 ENCOUNTER — Other Ambulatory Visit: Payer: Self-pay | Admitting: Hematology and Oncology

## 2021-09-14 ENCOUNTER — Inpatient Hospital Stay: Payer: Medicare Other

## 2021-09-14 VITALS — BP 153/84 | HR 97 | Temp 98.0°F | Resp 18

## 2021-09-14 DIAGNOSIS — D464 Refractory anemia, unspecified: Secondary | ICD-10-CM | POA: Insufficient documentation

## 2021-09-14 DIAGNOSIS — D696 Thrombocytopenia, unspecified: Secondary | ICD-10-CM | POA: Diagnosis not present

## 2021-09-14 DIAGNOSIS — Z79899 Other long term (current) drug therapy: Secondary | ICD-10-CM | POA: Insufficient documentation

## 2021-09-14 DIAGNOSIS — D469 Myelodysplastic syndrome, unspecified: Secondary | ICD-10-CM | POA: Insufficient documentation

## 2021-09-14 DIAGNOSIS — K7460 Unspecified cirrhosis of liver: Secondary | ICD-10-CM | POA: Insufficient documentation

## 2021-09-14 DIAGNOSIS — D4620 Refractory anemia with excess of blasts, unspecified: Secondary | ICD-10-CM

## 2021-09-14 DIAGNOSIS — D539 Nutritional anemia, unspecified: Secondary | ICD-10-CM | POA: Diagnosis not present

## 2021-09-14 LAB — CBC WITH DIFFERENTIAL (CANCER CENTER ONLY)
Abs Immature Granulocytes: 0.01 10*3/uL (ref 0.00–0.07)
Basophils Absolute: 0 10*3/uL (ref 0.0–0.1)
Basophils Relative: 1 %
Eosinophils Absolute: 0.1 10*3/uL (ref 0.0–0.5)
Eosinophils Relative: 3 %
HCT: 30 % — ABNORMAL LOW (ref 39.0–52.0)
Hemoglobin: 9.6 g/dL — ABNORMAL LOW (ref 13.0–17.0)
Immature Granulocytes: 0 %
Lymphocytes Relative: 18 %
Lymphs Abs: 0.8 10*3/uL (ref 0.7–4.0)
MCH: 31.7 pg (ref 26.0–34.0)
MCHC: 32 g/dL (ref 30.0–36.0)
MCV: 99 fL (ref 80.0–100.0)
Monocytes Absolute: 0.4 10*3/uL (ref 0.1–1.0)
Monocytes Relative: 10 %
Neutro Abs: 3.1 10*3/uL (ref 1.7–7.7)
Neutrophils Relative %: 68 %
Platelet Count: 81 10*3/uL — ABNORMAL LOW (ref 150–400)
RBC: 3.03 MIL/uL — ABNORMAL LOW (ref 4.22–5.81)
RDW: 19.4 % — ABNORMAL HIGH (ref 11.5–15.5)
WBC Count: 4.4 10*3/uL (ref 4.0–10.5)
nRBC: 0 % (ref 0.0–0.2)

## 2021-09-14 LAB — CMP (CANCER CENTER ONLY)
ALT: 20 U/L (ref 0–44)
AST: 45 U/L — ABNORMAL HIGH (ref 15–41)
Albumin: 3.2 g/dL — ABNORMAL LOW (ref 3.5–5.0)
Alkaline Phosphatase: 96 U/L (ref 38–126)
Anion gap: 8 (ref 5–15)
BUN: 18 mg/dL (ref 8–23)
CO2: 24 mmol/L (ref 22–32)
Calcium: 8.9 mg/dL (ref 8.9–10.3)
Chloride: 107 mmol/L (ref 98–111)
Creatinine: 1.56 mg/dL — ABNORMAL HIGH (ref 0.61–1.24)
GFR, Estimated: 48 mL/min — ABNORMAL LOW (ref >60)
Glucose, Bld: 102 mg/dL — ABNORMAL HIGH (ref 70–99)
Potassium: 3.4 mmol/L — ABNORMAL LOW (ref 3.5–5.1)
Sodium: 139 mmol/L (ref 135–145)
Total Bilirubin: 5.3 mg/dL (ref 0.3–1.2)
Total Protein: 7.5 g/dL (ref 6.5–8.1)

## 2021-09-14 MED ORDER — EPOETIN ALFA-EPBX 20000 UNIT/ML IJ SOLN
20000.0000 [IU] | Freq: Once | INTRAMUSCULAR | Status: AC
  Administered 2021-09-14: 20000 [IU] via SUBCUTANEOUS
  Filled 2021-09-14: qty 1

## 2021-09-14 MED ORDER — EPOETIN ALFA-EPBX 40000 UNIT/ML IJ SOLN
40000.0000 [IU] | Freq: Once | INTRAMUSCULAR | Status: AC
  Administered 2021-09-14: 40000 [IU] via SUBCUTANEOUS
  Filled 2021-09-14: qty 1

## 2021-09-14 NOTE — Patient Instructions (Signed)
Epoetin Alfa injection °What is this medication? °EPOETIN ALFA (e POE e tin AL fa) helps your body make more red blood cells. This medicine is used to treat anemia caused by chronic kidney disease, cancer chemotherapy, or HIV-therapy. It may also be used before surgery if you have anemia. °This medicine may be used for other purposes; ask your health care provider or pharmacist if you have questions. °COMMON BRAND NAME(S): Epogen, Procrit, Retacrit °What should I tell my care team before I take this medication? °They need to know if you have any of these conditions: °cancer °heart disease °high blood pressure °history of blood clots °history of stroke °low levels of folate, iron, or vitamin B12 in the blood °seizures °an unusual or allergic reaction to erythropoietin, albumin, benzyl alcohol, hamster proteins, other medicines, foods, dyes, or preservatives °pregnant or trying to get pregnant °breast-feeding °How should I use this medication? °This medicine is for injection into a vein or under the skin. It is usually given by a health care professional in a hospital or clinic setting. °If you get this medicine at home, you will be taught how to prepare and give this medicine. Use exactly as directed. Take your medicine at regular intervals. Do not take your medicine more often than directed. °It is important that you put your used needles and syringes in a special sharps container. Do not put them in a trash can. If you do not have a sharps container, call your pharmacist or healthcare provider to get one. °A special MedGuide will be given to you by the pharmacist with each prescription and refill. Be sure to read this information carefully each time. °Talk to your pediatrician regarding the use of this medicine in children. While this drug may be prescribed for selected conditions, precautions do apply. °Overdosage: If you think you have taken too much of this medicine contact a poison control center or emergency  room at once. °NOTE: This medicine is only for you. Do not share this medicine with others. °What if I miss a dose? °If you miss a dose, take it as soon as you can. If it is almost time for your next dose, take only that dose. Do not take double or extra doses. °What may interact with this medication? °Interactions have not been studied. °This list may not describe all possible interactions. Give your health care provider a list of all the medicines, herbs, non-prescription drugs, or dietary supplements you use. Also tell them if you smoke, drink alcohol, or use illegal drugs. Some items may interact with your medicine. °What should I watch for while using this medication? °Your condition will be monitored carefully while you are receiving this medicine. °You may need blood work done while you are taking this medicine. °This medicine may cause a decrease in vitamin B6. You should make sure that you get enough vitamin B6 while you are taking this medicine. Discuss the foods you eat and the vitamins you take with your health care professional. °What side effects may I notice from receiving this medication? °Side effects that you should report to your doctor or health care professional as soon as possible: °allergic reactions like skin rash, itching or hives, swelling of the face, lips, or tongue °seizures °signs and symptoms of a blood clot such as breathing problems; changes in vision; chest pain; severe, sudden headache; pain, swelling, warmth in the leg; trouble speaking; sudden numbness or weakness of the face, arm or leg °signs and symptoms of a stroke like   changes in vision; confusion; trouble speaking or understanding; severe headaches; sudden numbness or weakness of the face, arm or leg; trouble walking; dizziness; loss of balance or coordination °Side effects that usually do not require medical attention (report to your doctor or health care professional if they continue or are  bothersome): °chills °cough °dizziness °fever °headaches °joint pain °muscle cramps °muscle pain °nausea, vomiting °pain, redness, or irritation at site where injected °This list may not describe all possible side effects. Call your doctor for medical advice about side effects. You may report side effects to FDA at 1-800-FDA-1088. °Where should I keep my medication? °Keep out of the reach of children. °Store in a refrigerator between 2 and 8 degrees C (36 and 46 degrees F). Do not freeze or shake. Throw away any unused portion if using a single-dose vial. Multi-dose vials can be kept in the refrigerator for up to 21 days after the initial dose. Throw away unused medicine. °NOTE: This sheet is a summary. It may not cover all possible information. If you have questions about this medicine, talk to your doctor, pharmacist, or health care provider. °© 2022 [employer] (2017-04-03 00:00:00) ° °

## 2021-09-14 NOTE — Telephone Encounter (Signed)
CRITICAL VALUE STICKER  CRITICAL VALUE: Total Bili = 5.3  RECEIVER (on-site recipient of call): Yetta Glassman, CMA  DATE & TIME NOTIFIED: 09/14/21 at 12:15pm  MESSENGER (representative from lab): Suanne Marker  MD NOTIFIED: Lorenso Courier  TIME OF NOTIFICATION: 09/14/21 at 12:17pm  RESPONSE: Notification given to [facility], RN for follow-up with provider.

## 2021-09-20 DIAGNOSIS — T84050A Periprosthetic osteolysis of internal prosthetic right hip joint, initial encounter: Secondary | ICD-10-CM | POA: Diagnosis not present

## 2021-09-20 DIAGNOSIS — Z96641 Presence of right artificial hip joint: Secondary | ICD-10-CM | POA: Diagnosis not present

## 2021-09-20 DIAGNOSIS — T859XXA Unspecified complication of internal prosthetic device, implant and graft, initial encounter: Secondary | ICD-10-CM | POA: Diagnosis not present

## 2021-09-28 ENCOUNTER — Inpatient Hospital Stay (HOSPITAL_BASED_OUTPATIENT_CLINIC_OR_DEPARTMENT_OTHER): Payer: Medicare Other | Admitting: Hematology and Oncology

## 2021-09-28 ENCOUNTER — Inpatient Hospital Stay: Payer: Medicare Other

## 2021-09-28 ENCOUNTER — Other Ambulatory Visit: Payer: Self-pay

## 2021-09-28 VITALS — BP 154/62 | HR 83 | Temp 96.9°F | Resp 17 | Wt 145.8 lb

## 2021-09-28 DIAGNOSIS — D4620 Refractory anemia with excess of blasts, unspecified: Secondary | ICD-10-CM | POA: Diagnosis not present

## 2021-09-28 DIAGNOSIS — D464 Refractory anemia, unspecified: Secondary | ICD-10-CM | POA: Diagnosis not present

## 2021-09-28 DIAGNOSIS — D539 Nutritional anemia, unspecified: Secondary | ICD-10-CM

## 2021-09-28 DIAGNOSIS — D696 Thrombocytopenia, unspecified: Secondary | ICD-10-CM | POA: Diagnosis not present

## 2021-09-28 DIAGNOSIS — Z79899 Other long term (current) drug therapy: Secondary | ICD-10-CM | POA: Diagnosis not present

## 2021-09-28 DIAGNOSIS — K7460 Unspecified cirrhosis of liver: Secondary | ICD-10-CM | POA: Diagnosis not present

## 2021-09-28 DIAGNOSIS — D469 Myelodysplastic syndrome, unspecified: Secondary | ICD-10-CM | POA: Diagnosis not present

## 2021-09-28 LAB — CMP (CANCER CENTER ONLY)
ALT: 20 U/L (ref 0–44)
AST: 47 U/L — ABNORMAL HIGH (ref 15–41)
Albumin: 3.1 g/dL — ABNORMAL LOW (ref 3.5–5.0)
Alkaline Phosphatase: 114 U/L (ref 38–126)
Anion gap: 5 (ref 5–15)
BUN: 15 mg/dL (ref 8–23)
CO2: 26 mmol/L (ref 22–32)
Calcium: 9.1 mg/dL (ref 8.9–10.3)
Chloride: 110 mmol/L (ref 98–111)
Creatinine: 1.52 mg/dL — ABNORMAL HIGH (ref 0.61–1.24)
GFR, Estimated: 50 mL/min — ABNORMAL LOW (ref >60)
Glucose, Bld: 149 mg/dL — ABNORMAL HIGH (ref 70–99)
Potassium: 4.1 mmol/L (ref 3.5–5.1)
Sodium: 141 mmol/L (ref 135–145)
Total Bilirubin: 4 mg/dL (ref 0.3–1.2)
Total Protein: 7.1 g/dL (ref 6.5–8.1)

## 2021-09-28 LAB — CBC WITH DIFFERENTIAL (CANCER CENTER ONLY)
Abs Immature Granulocytes: 0.01 10*3/uL (ref 0.00–0.07)
Basophils Absolute: 0 10*3/uL (ref 0.0–0.1)
Basophils Relative: 0 %
Eosinophils Absolute: 0.1 10*3/uL (ref 0.0–0.5)
Eosinophils Relative: 3 %
HCT: 29.9 % — ABNORMAL LOW (ref 39.0–52.0)
Hemoglobin: 9.4 g/dL — ABNORMAL LOW (ref 13.0–17.0)
Immature Granulocytes: 0 %
Lymphocytes Relative: 17 %
Lymphs Abs: 0.6 10*3/uL — ABNORMAL LOW (ref 0.7–4.0)
MCH: 31.3 pg (ref 26.0–34.0)
MCHC: 31.4 g/dL (ref 30.0–36.0)
MCV: 99.7 fL (ref 80.0–100.0)
Monocytes Absolute: 0.3 10*3/uL (ref 0.1–1.0)
Monocytes Relative: 8 %
Neutro Abs: 2.7 10*3/uL (ref 1.7–7.7)
Neutrophils Relative %: 72 %
Platelet Count: 87 10*3/uL — ABNORMAL LOW (ref 150–400)
RBC: 3 MIL/uL — ABNORMAL LOW (ref 4.22–5.81)
RDW: 19.7 % — ABNORMAL HIGH (ref 11.5–15.5)
WBC Count: 3.8 10*3/uL — ABNORMAL LOW (ref 4.0–10.5)
nRBC: 0 % (ref 0.0–0.2)

## 2021-09-28 MED ORDER — EPOETIN ALFA-EPBX 20000 UNIT/ML IJ SOLN
20000.0000 [IU] | Freq: Once | INTRAMUSCULAR | Status: AC
  Administered 2021-09-28: 20000 [IU] via SUBCUTANEOUS
  Filled 2021-09-28: qty 1

## 2021-09-28 MED ORDER — EPOETIN ALFA-EPBX 40000 UNIT/ML IJ SOLN
40000.0000 [IU] | Freq: Once | INTRAMUSCULAR | Status: AC
  Administered 2021-09-28: 40000 [IU] via SUBCUTANEOUS
  Filled 2021-09-28: qty 1

## 2021-09-28 NOTE — Patient Instructions (Signed)
Epoetin Alfa injection °What is this medication? °EPOETIN ALFA (e POE e tin AL fa) helps your body make more red blood cells. This medicine is used to treat anemia caused by chronic kidney disease, cancer chemotherapy, or HIV-therapy. It may also be used before surgery if you have anemia. °This medicine may be used for other purposes; ask your health care provider or pharmacist if you have questions. °COMMON BRAND NAME(S): Epogen, Procrit, Retacrit °What should I tell my care team before I take this medication? °They need to know if you have any of these conditions: °cancer °heart disease °high blood pressure °history of blood clots °history of stroke °low levels of folate, iron, or vitamin B12 in the blood °seizures °an unusual or allergic reaction to erythropoietin, albumin, benzyl alcohol, hamster proteins, other medicines, foods, dyes, or preservatives °pregnant or trying to get pregnant °breast-feeding °How should I use this medication? °This medicine is for injection into a vein or under the skin. It is usually given by a health care professional in a hospital or clinic setting. °If you get this medicine at home, you will be taught how to prepare and give this medicine. Use exactly as directed. Take your medicine at regular intervals. Do not take your medicine more often than directed. °It is important that you put your used needles and syringes in a special sharps container. Do not put them in a trash can. If you do not have a sharps container, call your pharmacist or healthcare provider to get one. °A special MedGuide will be given to you by the pharmacist with each prescription and refill. Be sure to read this information carefully each time. °Talk to your pediatrician regarding the use of this medicine in children. While this drug may be prescribed for selected conditions, precautions do apply. °Overdosage: If you think you have taken too much of this medicine contact a poison control center or emergency  room at once. °NOTE: This medicine is only for you. Do not share this medicine with others. °What if I miss a dose? °If you miss a dose, take it as soon as you can. If it is almost time for your next dose, take only that dose. Do not take double or extra doses. °What may interact with this medication? °Interactions have not been studied. °This list may not describe all possible interactions. Give your health care provider a list of all the medicines, herbs, non-prescription drugs, or dietary supplements you use. Also tell them if you smoke, drink alcohol, or use illegal drugs. Some items may interact with your medicine. °What should I watch for while using this medication? °Your condition will be monitored carefully while you are receiving this medicine. °You may need blood work done while you are taking this medicine. °This medicine may cause a decrease in vitamin B6. You should make sure that you get enough vitamin B6 while you are taking this medicine. Discuss the foods you eat and the vitamins you take with your health care professional. °What side effects may I notice from receiving this medication? °Side effects that you should report to your doctor or health care professional as soon as possible: °allergic reactions like skin rash, itching or hives, swelling of the face, lips, or tongue °seizures °signs and symptoms of a blood clot such as breathing problems; changes in vision; chest pain; severe, sudden headache; pain, swelling, warmth in the leg; trouble speaking; sudden numbness or weakness of the face, arm or leg °signs and symptoms of a stroke like   changes in vision; confusion; trouble speaking or understanding; severe headaches; sudden numbness or weakness of the face, arm or leg; trouble walking; dizziness; loss of balance or coordination °Side effects that usually do not require medical attention (report to your doctor or health care professional if they continue or are  bothersome): °chills °cough °dizziness °fever °headaches °joint pain °muscle cramps °muscle pain °nausea, vomiting °pain, redness, or irritation at site where injected °This list may not describe all possible side effects. Call your doctor for medical advice about side effects. You may report side effects to FDA at 1-800-FDA-1088. °Where should I keep my medication? °Keep out of the reach of children. °Store in a refrigerator between 2 and 8 degrees C (36 and 46 degrees F). Do not freeze or shake. Throw away any unused portion if using a single-dose vial. Multi-dose vials can be kept in the refrigerator for up to 21 days after the initial dose. Throw away unused medicine. °NOTE: This sheet is a summary. It may not cover all possible information. If you have questions about this medicine, talk to your doctor, pharmacist, or health care provider. °© 2022 [employer] (2017-04-03 00:00:00) ° °
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
## 2021-09-28 NOTE — Progress Notes (Signed)
Critical Lab Value  Call from [facility] in lab Bilirubin is  4.0 mg/dl Reported to Dr. Suella Grove RN

## 2021-09-29 ENCOUNTER — Telehealth: Payer: Self-pay | Admitting: Hematology and Oncology

## 2021-09-29 NOTE — Telephone Encounter (Signed)
Scheduled per 2/15 los, pt sister has been called and confirmed

## 2021-10-05 ENCOUNTER — Encounter: Payer: Self-pay | Admitting: *Deleted

## 2021-10-05 ENCOUNTER — Inpatient Hospital Stay: Payer: Medicare Other

## 2021-10-05 ENCOUNTER — Other Ambulatory Visit: Payer: Self-pay

## 2021-10-05 ENCOUNTER — Telehealth: Payer: Self-pay | Admitting: *Deleted

## 2021-10-05 VITALS — BP 144/73 | HR 90 | Temp 98.3°F | Resp 18

## 2021-10-05 DIAGNOSIS — D464 Refractory anemia, unspecified: Secondary | ICD-10-CM | POA: Diagnosis not present

## 2021-10-05 DIAGNOSIS — D469 Myelodysplastic syndrome, unspecified: Secondary | ICD-10-CM | POA: Diagnosis not present

## 2021-10-05 DIAGNOSIS — D696 Thrombocytopenia, unspecified: Secondary | ICD-10-CM | POA: Diagnosis not present

## 2021-10-05 DIAGNOSIS — Z79899 Other long term (current) drug therapy: Secondary | ICD-10-CM | POA: Diagnosis not present

## 2021-10-05 DIAGNOSIS — D4620 Refractory anemia with excess of blasts, unspecified: Secondary | ICD-10-CM

## 2021-10-05 DIAGNOSIS — D539 Nutritional anemia, unspecified: Secondary | ICD-10-CM | POA: Diagnosis not present

## 2021-10-05 DIAGNOSIS — K7460 Unspecified cirrhosis of liver: Secondary | ICD-10-CM | POA: Diagnosis not present

## 2021-10-05 LAB — CBC WITH DIFFERENTIAL (CANCER CENTER ONLY)
Abs Immature Granulocytes: 0.01 10*3/uL (ref 0.00–0.07)
Basophils Absolute: 0 10*3/uL (ref 0.0–0.1)
Basophils Relative: 1 %
Eosinophils Absolute: 0.1 10*3/uL (ref 0.0–0.5)
Eosinophils Relative: 2 %
HCT: 29.2 % — ABNORMAL LOW (ref 39.0–52.0)
Hemoglobin: 9 g/dL — ABNORMAL LOW (ref 13.0–17.0)
Immature Granulocytes: 0 %
Lymphocytes Relative: 19 %
Lymphs Abs: 0.7 10*3/uL (ref 0.7–4.0)
MCH: 31 pg (ref 26.0–34.0)
MCHC: 30.8 g/dL (ref 30.0–36.0)
MCV: 100.7 fL — ABNORMAL HIGH (ref 80.0–100.0)
Monocytes Absolute: 0.4 10*3/uL (ref 0.1–1.0)
Monocytes Relative: 11 %
Neutro Abs: 2.4 10*3/uL (ref 1.7–7.7)
Neutrophils Relative %: 67 %
Platelet Count: 72 10*3/uL — ABNORMAL LOW (ref 150–400)
RBC: 2.9 MIL/uL — ABNORMAL LOW (ref 4.22–5.81)
RDW: 20.1 % — ABNORMAL HIGH (ref 11.5–15.5)
WBC Count: 3.7 10*3/uL — ABNORMAL LOW (ref 4.0–10.5)
nRBC: 0 % (ref 0.0–0.2)

## 2021-10-05 LAB — CMP (CANCER CENTER ONLY)
ALT: 22 U/L (ref 0–44)
AST: 44 U/L — ABNORMAL HIGH (ref 15–41)
Albumin: 3 g/dL — ABNORMAL LOW (ref 3.5–5.0)
Alkaline Phosphatase: 85 U/L (ref 38–126)
Anion gap: 8 (ref 5–15)
BUN: 20 mg/dL (ref 8–23)
CO2: 23 mmol/L (ref 22–32)
Calcium: 9.4 mg/dL (ref 8.9–10.3)
Chloride: 109 mmol/L (ref 98–111)
Creatinine: 1.55 mg/dL — ABNORMAL HIGH (ref 0.61–1.24)
GFR, Estimated: 49 mL/min — ABNORMAL LOW (ref >60)
Glucose, Bld: 86 mg/dL (ref 70–99)
Potassium: 4 mmol/L (ref 3.5–5.1)
Sodium: 140 mmol/L (ref 135–145)
Total Bilirubin: 4.3 mg/dL (ref 0.3–1.2)
Total Protein: 7.4 g/dL (ref 6.5–8.1)

## 2021-10-05 MED ORDER — EPOETIN ALFA-EPBX 20000 UNIT/ML IJ SOLN: 60000.0000 [IU] | INTRAMUSCULAR | Status: DC

## 2021-10-05 MED ORDER — EPOETIN ALFA-EPBX 40000 UNIT/ML IJ SOLN
40000.0000 [IU] | Freq: Once | INTRAMUSCULAR | Status: AC
  Administered 2021-10-05: 40000 [IU] via SUBCUTANEOUS
  Filled 2021-10-05: qty 1

## 2021-10-05 MED ORDER — EPOETIN ALFA-EPBX 20000 UNIT/ML IJ SOLN
20000.0000 [IU] | Freq: Once | INTRAMUSCULAR | Status: AC
  Administered 2021-10-05: 20000 [IU] via SUBCUTANEOUS
  Filled 2021-10-05: qty 1

## 2021-10-05 NOTE — Patient Instructions (Signed)
Epoetin Alfa injection °What is this medication? °EPOETIN ALFA (e POE e tin AL fa) helps your body make more red blood cells. This medicine is used to treat anemia caused by chronic kidney disease, cancer chemotherapy, or HIV-therapy. It may also be used before surgery if you have anemia. °This medicine may be used for other purposes; ask your health care provider or pharmacist if you have questions. °COMMON BRAND NAME(S): Epogen, Procrit, Retacrit °What should I tell my care team before I take this medication? °They need to know if you have any of these conditions: °cancer °heart disease °high blood pressure °history of blood clots °history of stroke °low levels of folate, iron, or vitamin B12 in the blood °seizures °an unusual or allergic reaction to erythropoietin, albumin, benzyl alcohol, hamster proteins, other medicines, foods, dyes, or preservatives °pregnant or trying to get pregnant °breast-feeding °How should I use this medication? °This medicine is for injection into a vein or under the skin. It is usually given by a health care professional in a hospital or clinic setting. °If you get this medicine at home, you will be taught how to prepare and give this medicine. Use exactly as directed. Take your medicine at regular intervals. Do not take your medicine more often than directed. °It is important that you put your used needles and syringes in a special sharps container. Do not put them in a trash can. If you do not have a sharps container, call your pharmacist or healthcare provider to get one. °A special MedGuide will be given to you by the pharmacist with each prescription and refill. Be sure to read this information carefully each time. °Talk to your pediatrician regarding the use of this medicine in children. While this drug may be prescribed for selected conditions, precautions do apply. °Overdosage: If you think you have taken too much of this medicine contact a poison control center or emergency  room at once. °NOTE: This medicine is only for you. Do not share this medicine with others. °What if I miss a dose? °If you miss a dose, take it as soon as you can. If it is almost time for your next dose, take only that dose. Do not take double or extra doses. °What may interact with this medication? °Interactions have not been studied. °This list may not describe all possible interactions. Give your health care provider a list of all the medicines, herbs, non-prescription drugs, or dietary supplements you use. Also tell them if you smoke, drink alcohol, or use illegal drugs. Some items may interact with your medicine. °What should I watch for while using this medication? °Your condition will be monitored carefully while you are receiving this medicine. °You may need blood work done while you are taking this medicine. °This medicine may cause a decrease in vitamin B6. You should make sure that you get enough vitamin B6 while you are taking this medicine. Discuss the foods you eat and the vitamins you take with your health care professional. °What side effects may I notice from receiving this medication? °Side effects that you should report to your doctor or health care professional as soon as possible: °allergic reactions like skin rash, itching or hives, swelling of the face, lips, or tongue °seizures °signs and symptoms of a blood clot such as breathing problems; changes in vision; chest pain; severe, sudden headache; pain, swelling, warmth in the leg; trouble speaking; sudden numbness or weakness of the face, arm or leg °signs and symptoms of a stroke like   changes in vision; confusion; trouble speaking or understanding; severe headaches; sudden numbness or weakness of the face, arm or leg; trouble walking; dizziness; loss of balance or coordination °Side effects that usually do not require medical attention (report to your doctor or health care professional if they continue or are  bothersome): °chills °cough °dizziness °fever °headaches °joint pain °muscle cramps °muscle pain °nausea, vomiting °pain, redness, or irritation at site where injected °This list may not describe all possible side effects. Call your doctor for medical advice about side effects. You may report side effects to FDA at 1-800-FDA-1088. °Where should I keep my medication? °Keep out of the reach of children. °Store in a refrigerator between 2 and 8 degrees C (36 and 46 degrees F). Do not freeze or shake. Throw away any unused portion if using a single-dose vial. Multi-dose vials can be kept in the refrigerator for up to 21 days after the initial dose. Throw away unused medicine. °NOTE: This sheet is a summary. It may not cover all possible information. If you have questions about this medicine, talk to your doctor, pharmacist, or health care provider. °© 2022 [employer] (2017-04-03 00:00:00) ° °

## 2021-10-05 NOTE — Telephone Encounter (Signed)
CRITICAL VALUE STICKER  CRITICAL VALUE: T. Bili 4.3  RECEIVER (on-site recipient of call): Drucie Ip, RN  DATE & TIME NOTIFIED: 3:34 pm  10/05/21  MESSENGER (representative from lab):Jay  MD NOTIFIED: Dr. Lorenso Courier  TIME OF NOTIFICATION: 3:35 pm 10/05/21  RESPONSE:  acknowledged

## 2021-10-05 NOTE — Progress Notes (Unsigned)
CRITICAL TOTAL BILIRUBIN OF 4.3, CALLED TO, READ BACK BY AND VERIFIED WITH BETH TRACEY RN @ 1100 BY J Tajee Savant ON 10/05/21.

## 2021-10-07 ENCOUNTER — Ambulatory Visit: Payer: Medicare Other | Admitting: Hematology and Oncology

## 2021-10-07 ENCOUNTER — Other Ambulatory Visit: Payer: Medicare Other

## 2021-10-07 DIAGNOSIS — F331 Major depressive disorder, recurrent, moderate: Secondary | ICD-10-CM | POA: Diagnosis not present

## 2021-10-07 DIAGNOSIS — Z79899 Other long term (current) drug therapy: Secondary | ICD-10-CM | POA: Diagnosis not present

## 2021-10-12 ENCOUNTER — Other Ambulatory Visit: Payer: Self-pay

## 2021-10-12 ENCOUNTER — Inpatient Hospital Stay: Payer: Medicare Other | Attending: Hematology and Oncology

## 2021-10-12 ENCOUNTER — Inpatient Hospital Stay: Payer: Medicare Other

## 2021-10-12 VITALS — BP 156/69 | HR 94 | Temp 98.1°F | Resp 18

## 2021-10-12 DIAGNOSIS — E8809 Other disorders of plasma-protein metabolism, not elsewhere classified: Secondary | ICD-10-CM | POA: Diagnosis not present

## 2021-10-12 DIAGNOSIS — R600 Localized edema: Secondary | ICD-10-CM | POA: Insufficient documentation

## 2021-10-12 DIAGNOSIS — Z79899 Other long term (current) drug therapy: Secondary | ICD-10-CM | POA: Insufficient documentation

## 2021-10-12 DIAGNOSIS — R7989 Other specified abnormal findings of blood chemistry: Secondary | ICD-10-CM | POA: Diagnosis not present

## 2021-10-12 DIAGNOSIS — Z87891 Personal history of nicotine dependence: Secondary | ICD-10-CM | POA: Insufficient documentation

## 2021-10-12 DIAGNOSIS — D4620 Refractory anemia with excess of blasts, unspecified: Secondary | ICD-10-CM | POA: Diagnosis not present

## 2021-10-12 LAB — CMP (CANCER CENTER ONLY)
ALT: 18 U/L (ref 0–44)
AST: 40 U/L (ref 15–41)
Albumin: 3.1 g/dL — ABNORMAL LOW (ref 3.5–5.0)
Alkaline Phosphatase: 112 U/L (ref 38–126)
Anion gap: 8 (ref 5–15)
BUN: 18 mg/dL (ref 8–23)
CO2: 24 mmol/L (ref 22–32)
Calcium: 9 mg/dL (ref 8.9–10.3)
Chloride: 110 mmol/L (ref 98–111)
Creatinine: 1.55 mg/dL — ABNORMAL HIGH (ref 0.61–1.24)
GFR, Estimated: 49 mL/min — ABNORMAL LOW (ref >60)
Glucose, Bld: 87 mg/dL (ref 70–99)
Potassium: 3.3 mmol/L — ABNORMAL LOW (ref 3.5–5.1)
Sodium: 142 mmol/L (ref 135–145)
Total Bilirubin: 4.8 mg/dL (ref 0.3–1.2)
Total Protein: 7.2 g/dL (ref 6.5–8.1)

## 2021-10-12 LAB — CBC WITH DIFFERENTIAL (CANCER CENTER ONLY)
Abs Immature Granulocytes: 0.02 10*3/uL (ref 0.00–0.07)
Basophils Absolute: 0 10*3/uL (ref 0.0–0.1)
Basophils Relative: 1 %
Eosinophils Absolute: 0.1 10*3/uL (ref 0.0–0.5)
Eosinophils Relative: 3 %
HCT: 30.3 % — ABNORMAL LOW (ref 39.0–52.0)
Hemoglobin: 9.7 g/dL — ABNORMAL LOW (ref 13.0–17.0)
Immature Granulocytes: 1 %
Lymphocytes Relative: 16 %
Lymphs Abs: 0.7 10*3/uL (ref 0.7–4.0)
MCH: 31.6 pg (ref 26.0–34.0)
MCHC: 32 g/dL (ref 30.0–36.0)
MCV: 98.7 fL (ref 80.0–100.0)
Monocytes Absolute: 0.5 10*3/uL (ref 0.1–1.0)
Monocytes Relative: 10 %
Neutro Abs: 3.1 10*3/uL (ref 1.7–7.7)
Neutrophils Relative %: 69 %
Platelet Count: 89 10*3/uL — ABNORMAL LOW (ref 150–400)
RBC: 3.07 MIL/uL — ABNORMAL LOW (ref 4.22–5.81)
RDW: 20.7 % — ABNORMAL HIGH (ref 11.5–15.5)
WBC Count: 4.4 10*3/uL (ref 4.0–10.5)
nRBC: 0 % (ref 0.0–0.2)

## 2021-10-12 MED ORDER — EPOETIN ALFA-EPBX 40000 UNIT/ML IJ SOLN
40000.0000 [IU] | Freq: Once | INTRAMUSCULAR | Status: AC
  Administered 2021-10-12: 40000 [IU] via SUBCUTANEOUS
  Filled 2021-10-12: qty 1

## 2021-10-12 MED ORDER — EPOETIN ALFA-EPBX 20000 UNIT/ML IJ SOLN
20000.0000 [IU] | Freq: Once | INTRAMUSCULAR | Status: AC
  Administered 2021-10-12: 20000 [IU] via SUBCUTANEOUS
  Filled 2021-10-12: qty 1

## 2021-10-12 NOTE — Progress Notes (Signed)
CRITICAL VALUE STICKER ? ?CRITICAL VALUE: Bilirubin 4.8 ? ?RECEIVER (on-site recipient of call): [employer] ? ?DATE & TIME NOTIFIED: 10/12/21 1153 ? ?MESSENGER (representative from lab): Lelan Pons ? ?MD NOTIFIED: Dr. Lorenso Courier ? ?TIME OF NOTIFICATION: 1937 ? ?RESPONSE: Dr. Lorenso Courier informed, and he said this is the patient's baseline.  ?

## 2021-10-12 NOTE — Patient Instructions (Signed)
Epoetin Alfa injection °What is this medication? °EPOETIN ALFA (e POE e tin AL fa) helps your body make more red blood cells. This medicine is used to treat anemia caused by chronic kidney disease, cancer chemotherapy, or HIV-therapy. It may also be used before surgery if you have anemia. °This medicine may be used for other purposes; ask your health care provider or pharmacist if you have questions. °COMMON BRAND NAME(S): Epogen, Procrit, Retacrit °What should I tell my care team before I take this medication? °They need to know if you have any of these conditions: °cancer °heart disease °high blood pressure °history of blood clots °history of stroke °low levels of folate, iron, or vitamin B12 in the blood °seizures °an unusual or allergic reaction to erythropoietin, albumin, benzyl alcohol, hamster proteins, other medicines, foods, dyes, or preservatives °pregnant or trying to get pregnant °breast-feeding °How should I use this medication? °This medicine is for injection into a vein or under the skin. It is usually given by a health care professional in a hospital or clinic setting. °If you get this medicine at home, you will be taught how to prepare and give this medicine. Use exactly as directed. Take your medicine at regular intervals. Do not take your medicine more often than directed. °It is important that you put your used needles and syringes in a special sharps container. Do not put them in a trash can. If you do not have a sharps container, call your pharmacist or healthcare provider to get one. °A special MedGuide will be given to you by the pharmacist with each prescription and refill. Be sure to read this information carefully each time. °Talk to your pediatrician regarding the use of this medicine in children. While this drug may be prescribed for selected conditions, precautions do apply. °Overdosage: If you think you have taken too much of this medicine contact a poison control center or emergency  room at once. °NOTE: This medicine is only for you. Do not share this medicine with others. °What if I miss a dose? °If you miss a dose, take it as soon as you can. If it is almost time for your next dose, take only that dose. Do not take double or extra doses. °What may interact with this medication? °Interactions have not been studied. °This list may not describe all possible interactions. Give your health care provider a list of all the medicines, herbs, non-prescription drugs, or dietary supplements you use. Also tell them if you smoke, drink alcohol, or use illegal drugs. Some items may interact with your medicine. °What should I watch for while using this medication? °Your condition will be monitored carefully while you are receiving this medicine. °You may need blood work done while you are taking this medicine. °This medicine may cause a decrease in vitamin B6. You should make sure that you get enough vitamin B6 while you are taking this medicine. Discuss the foods you eat and the vitamins you take with your health care professional. °What side effects may I notice from receiving this medication? °Side effects that you should report to your doctor or health care professional as soon as possible: °allergic reactions like skin rash, itching or hives, swelling of the face, lips, or tongue °seizures °signs and symptoms of a blood clot such as breathing problems; changes in vision; chest pain; severe, sudden headache; pain, swelling, warmth in the leg; trouble speaking; sudden numbness or weakness of the face, arm or leg °signs and symptoms of a stroke like   changes in vision; confusion; trouble speaking or understanding; severe headaches; sudden numbness or weakness of the face, arm or leg; trouble walking; dizziness; loss of balance or coordination °Side effects that usually do not require medical attention (report to your doctor or health care professional if they continue or are  bothersome): °chills °cough °dizziness °fever °headaches °joint pain °muscle cramps °muscle pain °nausea, vomiting °pain, redness, or irritation at site where injected °This list may not describe all possible side effects. Call your doctor for medical advice about side effects. You may report side effects to FDA at 1-800-FDA-1088. °Where should I keep my medication? °Keep out of the reach of children. °Store in a refrigerator between 2 and 8 degrees C (36 and 46 degrees F). Do not freeze or shake. Throw away any unused portion if using a single-dose vial. Multi-dose vials can be kept in the refrigerator for up to 21 days after the initial dose. Throw away unused medicine. °NOTE: This sheet is a summary. It may not cover all possible information. If you have questions about this medicine, talk to your doctor, pharmacist, or health care provider. °© 2022 [employer] (2017-04-03 00:00:00) ° °

## 2021-10-19 ENCOUNTER — Encounter: Payer: Self-pay | Admitting: Hematology and Oncology

## 2021-10-19 ENCOUNTER — Inpatient Hospital Stay: Payer: Medicare Other

## 2021-10-19 ENCOUNTER — Other Ambulatory Visit: Payer: Self-pay

## 2021-10-19 VITALS — BP 162/79 | HR 100 | Temp 98.7°F | Resp 18

## 2021-10-19 DIAGNOSIS — E8809 Other disorders of plasma-protein metabolism, not elsewhere classified: Secondary | ICD-10-CM | POA: Diagnosis not present

## 2021-10-19 DIAGNOSIS — R600 Localized edema: Secondary | ICD-10-CM | POA: Diagnosis not present

## 2021-10-19 DIAGNOSIS — R7989 Other specified abnormal findings of blood chemistry: Secondary | ICD-10-CM | POA: Diagnosis not present

## 2021-10-19 DIAGNOSIS — D4620 Refractory anemia with excess of blasts, unspecified: Secondary | ICD-10-CM

## 2021-10-19 DIAGNOSIS — Z87891 Personal history of nicotine dependence: Secondary | ICD-10-CM | POA: Diagnosis not present

## 2021-10-19 DIAGNOSIS — Z79899 Other long term (current) drug therapy: Secondary | ICD-10-CM | POA: Diagnosis not present

## 2021-10-19 LAB — CBC WITH DIFFERENTIAL (CANCER CENTER ONLY)
Abs Immature Granulocytes: 0.03 10*3/uL (ref 0.00–0.07)
Basophils Absolute: 0 10*3/uL (ref 0.0–0.1)
Basophils Relative: 0 %
Eosinophils Absolute: 0.2 10*3/uL (ref 0.0–0.5)
Eosinophils Relative: 3 %
HCT: 30.9 % — ABNORMAL LOW (ref 39.0–52.0)
Hemoglobin: 9.9 g/dL — ABNORMAL LOW (ref 13.0–17.0)
Immature Granulocytes: 1 %
Lymphocytes Relative: 20 %
Lymphs Abs: 1 10*3/uL (ref 0.7–4.0)
MCH: 31.8 pg (ref 26.0–34.0)
MCHC: 32 g/dL (ref 30.0–36.0)
MCV: 99.4 fL (ref 80.0–100.0)
Monocytes Absolute: 0.5 10*3/uL (ref 0.1–1.0)
Monocytes Relative: 10 %
Neutro Abs: 3.2 10*3/uL (ref 1.7–7.7)
Neutrophils Relative %: 66 %
Platelet Count: 83 10*3/uL — ABNORMAL LOW (ref 150–400)
RBC: 3.11 MIL/uL — ABNORMAL LOW (ref 4.22–5.81)
RDW: 21 % — ABNORMAL HIGH (ref 11.5–15.5)
WBC Count: 4.9 10*3/uL (ref 4.0–10.5)
nRBC: 0 % (ref 0.0–0.2)

## 2021-10-19 LAB — CMP (CANCER CENTER ONLY)
ALT: 19 U/L (ref 0–44)
AST: 48 U/L — ABNORMAL HIGH (ref 15–41)
Albumin: 2.8 g/dL — ABNORMAL LOW (ref 3.5–5.0)
Alkaline Phosphatase: 83 U/L (ref 38–126)
Anion gap: 7 (ref 5–15)
BUN: 20 mg/dL (ref 8–23)
CO2: 24 mmol/L (ref 22–32)
Calcium: 9.1 mg/dL (ref 8.9–10.3)
Chloride: 108 mmol/L (ref 98–111)
Creatinine: 1.54 mg/dL — ABNORMAL HIGH (ref 0.61–1.24)
GFR, Estimated: 49 mL/min — ABNORMAL LOW (ref >60)
Glucose, Bld: 86 mg/dL (ref 70–99)
Potassium: 4.4 mmol/L (ref 3.5–5.1)
Sodium: 139 mmol/L (ref 135–145)
Total Bilirubin: 5.6 mg/dL (ref 0.3–1.2)
Total Protein: 7.2 g/dL (ref 6.5–8.1)

## 2021-10-19 MED ORDER — EPOETIN ALFA-EPBX 20000 UNIT/ML IJ SOLN
20000.0000 [IU] | Freq: Once | INTRAMUSCULAR | Status: AC
  Administered 2021-10-19: 20000 [IU] via SUBCUTANEOUS
  Filled 2021-10-19: qty 1

## 2021-10-19 MED ORDER — EPOETIN ALFA-EPBX 40000 UNIT/ML IJ SOLN
40000.0000 [IU] | Freq: Once | INTRAMUSCULAR | Status: AC
  Administered 2021-10-19: 40000 [IU] via SUBCUTANEOUS
  Filled 2021-10-19: qty 1

## 2021-10-19 NOTE — Progress Notes (Signed)
Pt's blood pressure is 162/79, he is asymptomatic. Per MD's treatment conditions for Retacrit injection under supportive therapy -to notify MD if SBP is >180, okay to proceed.  ?

## 2021-10-19 NOTE — Progress Notes (Unsigned)
CRIT TBILI (5.6) CALLED TO ANSYI SILAS AT [phone] ?

## 2021-10-19 NOTE — Patient Instructions (Signed)
Epoetin Alfa injection °What is this medication? °EPOETIN ALFA (e POE e tin AL fa) helps your body make more red blood cells. This medicine is used to treat anemia caused by chronic kidney disease, cancer chemotherapy, or HIV-therapy. It may also be used before surgery if you have anemia. °This medicine may be used for other purposes; ask your health care provider or pharmacist if you have questions. °COMMON BRAND NAME(S): Epogen, Procrit, Retacrit °What should I tell my care team before I take this medication? °They need to know if you have any of these conditions: °cancer °heart disease °high blood pressure °history of blood clots °history of stroke °low levels of folate, iron, or vitamin B12 in the blood °seizures °an unusual or allergic reaction to erythropoietin, albumin, benzyl alcohol, hamster proteins, other medicines, foods, dyes, or preservatives °pregnant or trying to get pregnant °breast-feeding °How should I use this medication? °This medicine is for injection into a vein or under the skin. It is usually given by a health care professional in a hospital or clinic setting. °If you get this medicine at home, you will be taught how to prepare and give this medicine. Use exactly as directed. Take your medicine at regular intervals. Do not take your medicine more often than directed. °It is important that you put your used needles and syringes in a special sharps container. Do not put them in a trash can. If you do not have a sharps container, call your pharmacist or healthcare provider to get one. °A special MedGuide will be given to you by the pharmacist with each prescription and refill. Be sure to read this information carefully each time. °Talk to your pediatrician regarding the use of this medicine in children. While this drug may be prescribed for selected conditions, precautions do apply. °Overdosage: If you think you have taken too much of this medicine contact a poison control center or emergency  room at once. °NOTE: This medicine is only for you. Do not share this medicine with others. °What if I miss a dose? °If you miss a dose, take it as soon as you can. If it is almost time for your next dose, take only that dose. Do not take double or extra doses. °What may interact with this medication? °Interactions have not been studied. °This list may not describe all possible interactions. Give your health care provider a list of all the medicines, herbs, non-prescription drugs, or dietary supplements you use. Also tell them if you smoke, drink alcohol, or use illegal drugs. Some items may interact with your medicine. °What should I watch for while using this medication? °Your condition will be monitored carefully while you are receiving this medicine. °You may need blood work done while you are taking this medicine. °This medicine may cause a decrease in vitamin B6. You should make sure that you get enough vitamin B6 while you are taking this medicine. Discuss the foods you eat and the vitamins you take with your health care professional. °What side effects may I notice from receiving this medication? °Side effects that you should report to your doctor or health care professional as soon as possible: °allergic reactions like skin rash, itching or hives, swelling of the face, lips, or tongue °seizures °signs and symptoms of a blood clot such as breathing problems; changes in vision; chest pain; severe, sudden headache; pain, swelling, warmth in the leg; trouble speaking; sudden numbness or weakness of the face, arm or leg °signs and symptoms of a stroke like   changes in vision; confusion; trouble speaking or understanding; severe headaches; sudden numbness or weakness of the face, arm or leg; trouble walking; dizziness; loss of balance or coordination °Side effects that usually do not require medical attention (report to your doctor or health care professional if they continue or are  bothersome): °chills °cough °dizziness °fever °headaches °joint pain °muscle cramps °muscle pain °nausea, vomiting °pain, redness, or irritation at site where injected °This list may not describe all possible side effects. Call your doctor for medical advice about side effects. You may report side effects to FDA at 1-800-FDA-1088. °Where should I keep my medication? °Keep out of the reach of children. °Store in a refrigerator between 2 and 8 degrees C (36 and 46 degrees F). Do not freeze or shake. Throw away any unused portion if using a single-dose vial. Multi-dose vials can be kept in the refrigerator for up to 21 days after the initial dose. Throw away unused medicine. °NOTE: This sheet is a summary. It may not cover all possible information. If you have questions about this medicine, talk to your doctor, pharmacist, or health care provider. °© 2022 [employer] (2017-04-03 00:00:00) ° °

## 2021-10-19 NOTE — Progress Notes (Signed)
Received call from patient sister Stanton Kidney regarding financial assistance. ? ?Introduced myself as Arboriculturist and to listen to concerns. She states patient had concerns with financials as far as amount being left after Medicare pays their portion and she assists him. Advised I would review for possible assistance for diagnosis/medication. ? ?Discussed one-time $1000 Radio broadcast assistant to assist with personal expenses while going through treatment. Advised of the income guidelines. She states she will email me documentation for grant purposes. He will be approved based on income. Advised once this is done, we will work on completing process and paperwork. ?She verbalized understanding. ? ?Clinical biochemist via [employer])  online on behalf of patient. Patient approved based on diagnosis for a guaranteed amount of $5500 with possible access to an additional $5,000 if needed totalling $10,500 10/19/21 - 10/20/22 with a lookback date of 04/22/21. This will assist with oop treatment related cost after insurance pays their portion. A copy of the approval letter sent to [facility] for billing/claim submissions. A copy will be mailed to patient for his records only. ? ?Also applied for Urgent Need and Patient Aid grants through [employer]. Patient approved for $500 and $100 one-time stipends through [employer]. A copy of the approval letters will be mailed to patient with instructions as well as checks sent directly to patient in 7-10 business days. His sister has my card for any additional financial questions or concerns. ? ?

## 2021-10-25 ENCOUNTER — Other Ambulatory Visit: Payer: Self-pay | Admitting: Physician Assistant

## 2021-10-25 DIAGNOSIS — D4620 Refractory anemia with excess of blasts, unspecified: Secondary | ICD-10-CM

## 2021-10-26 ENCOUNTER — Inpatient Hospital Stay: Payer: Medicare Other

## 2021-10-26 ENCOUNTER — Telehealth: Payer: Self-pay

## 2021-10-26 ENCOUNTER — Encounter: Payer: Self-pay | Admitting: Physician Assistant

## 2021-10-26 ENCOUNTER — Other Ambulatory Visit: Payer: Self-pay

## 2021-10-26 ENCOUNTER — Inpatient Hospital Stay (HOSPITAL_BASED_OUTPATIENT_CLINIC_OR_DEPARTMENT_OTHER): Payer: Medicare Other | Admitting: Physician Assistant

## 2021-10-26 VITALS — BP 159/67 | HR 100 | Temp 97.3°F | Resp 17 | Wt 147.4 lb

## 2021-10-26 DIAGNOSIS — E8809 Other disorders of plasma-protein metabolism, not elsewhere classified: Secondary | ICD-10-CM | POA: Diagnosis not present

## 2021-10-26 DIAGNOSIS — R7989 Other specified abnormal findings of blood chemistry: Secondary | ICD-10-CM | POA: Diagnosis not present

## 2021-10-26 DIAGNOSIS — D4620 Refractory anemia with excess of blasts, unspecified: Secondary | ICD-10-CM | POA: Diagnosis not present

## 2021-10-26 DIAGNOSIS — D696 Thrombocytopenia, unspecified: Secondary | ICD-10-CM

## 2021-10-26 DIAGNOSIS — R600 Localized edema: Secondary | ICD-10-CM | POA: Diagnosis not present

## 2021-10-26 DIAGNOSIS — Z79899 Other long term (current) drug therapy: Secondary | ICD-10-CM | POA: Diagnosis not present

## 2021-10-26 DIAGNOSIS — Z87891 Personal history of nicotine dependence: Secondary | ICD-10-CM | POA: Diagnosis not present

## 2021-10-26 LAB — CBC WITH DIFFERENTIAL (CANCER CENTER ONLY)
Abs Immature Granulocytes: 0.03 10*3/uL (ref 0.00–0.07)
Basophils Absolute: 0 10*3/uL (ref 0.0–0.1)
Basophils Relative: 0 %
Eosinophils Absolute: 0.1 10*3/uL (ref 0.0–0.5)
Eosinophils Relative: 3 %
HCT: 32.8 % — ABNORMAL LOW (ref 39.0–52.0)
Hemoglobin: 10.3 g/dL — ABNORMAL LOW (ref 13.0–17.0)
Immature Granulocytes: 1 %
Lymphocytes Relative: 16 %
Lymphs Abs: 0.8 10*3/uL (ref 0.7–4.0)
MCH: 30.9 pg (ref 26.0–34.0)
MCHC: 31.4 g/dL (ref 30.0–36.0)
MCV: 98.5 fL (ref 80.0–100.0)
Monocytes Absolute: 0.6 10*3/uL (ref 0.1–1.0)
Monocytes Relative: 11 %
Neutro Abs: 3.4 10*3/uL (ref 1.7–7.7)
Neutrophils Relative %: 69 %
Platelet Count: 101 10*3/uL — ABNORMAL LOW (ref 150–400)
RBC: 3.33 MIL/uL — ABNORMAL LOW (ref 4.22–5.81)
RDW: 20.9 % — ABNORMAL HIGH (ref 11.5–15.5)
WBC Count: 4.9 10*3/uL (ref 4.0–10.5)
nRBC: 0 % (ref 0.0–0.2)

## 2021-10-26 LAB — CMP (CANCER CENTER ONLY)
ALT: 21 U/L (ref 0–44)
AST: 45 U/L — ABNORMAL HIGH (ref 15–41)
Albumin: 3.1 g/dL — ABNORMAL LOW (ref 3.5–5.0)
Alkaline Phosphatase: 88 U/L (ref 38–126)
Anion gap: 8 (ref 5–15)
BUN: 25 mg/dL — ABNORMAL HIGH (ref 8–23)
CO2: 22 mmol/L (ref 22–32)
Calcium: 9.3 mg/dL (ref 8.9–10.3)
Chloride: 111 mmol/L (ref 98–111)
Creatinine: 1.7 mg/dL — ABNORMAL HIGH (ref 0.61–1.24)
GFR, Estimated: 44 mL/min — ABNORMAL LOW (ref >60)
Glucose, Bld: 97 mg/dL (ref 70–99)
Potassium: 3.9 mmol/L (ref 3.5–5.1)
Sodium: 141 mmol/L (ref 135–145)
Total Bilirubin: 4.9 mg/dL (ref 0.3–1.2)
Total Protein: 7.6 g/dL (ref 6.5–8.1)

## 2021-10-26 MED ORDER — EPOETIN ALFA-EPBX 20000 UNIT/ML IJ SOLN
20000.0000 [IU] | Freq: Once | INTRAMUSCULAR | Status: AC
  Administered 2021-10-26: 20000 [IU] via SUBCUTANEOUS
  Filled 2021-10-26: qty 1

## 2021-10-26 MED ORDER — EPOETIN ALFA-EPBX 40000 UNIT/ML IJ SOLN
40000.0000 [IU] | Freq: Once | INTRAMUSCULAR | Status: AC
  Administered 2021-10-26: 40000 [IU] via SUBCUTANEOUS
  Filled 2021-10-26: qty 1

## 2021-10-26 NOTE — Telephone Encounter (Signed)
Per Elouise Munroe request, current labs faxed to Dr. Kurtis Bushman at [facility] in [place]. ?
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
## 2021-10-26 NOTE — Telephone Encounter (Signed)
CRITICAL VALUE STICKER ? ?CRITICAL VALUE:  bilirubin  4.9 ? ?RECEIVER (on-site recipient of call):  [employer], LPN ? ?DATE & TIME NOTIFIED: 3:39 ? ?MESSENGER (representative from lab): Rhonda ? ?MD NOTIFIED: Dede Query, PA-C ? ? ?TIME OF NOTIFICATION:  3:45 ? ?RESPONSE:  ? ?

## 2021-10-26 NOTE — Progress Notes (Unsigned)
CRIT TBILI (4.9) CALLED TO AMBER DDUDLEY AT [phone]. RB ?

## 2021-10-26 NOTE — Progress Notes (Signed)
Patient approved for one-time $1000 [employer] grant to assist with personal expenses while going through treatment. He was given a copy of the approval letter ad expense sheet at registration along with the Outpatient pharmacy information. He received a gift card today from his grant. ? ?He has my card along with paperwork in brown clasp folder for any additional financial questions or concerns. ?

## 2021-10-26 NOTE — Patient Instructions (Signed)
Epoetin Alfa injection °What is this medication? °EPOETIN ALFA (e POE e tin AL fa) helps your body make more red blood cells. This medicine is used to treat anemia caused by chronic kidney disease, cancer chemotherapy, or HIV-therapy. It may also be used before surgery if you have anemia. °This medicine may be used for other purposes; ask your health care provider or pharmacist if you have questions. °COMMON BRAND NAME(S): Epogen, Procrit, Retacrit °What should I tell my care team before I take this medication? °They need to know if you have any of these conditions: °cancer °heart disease °high blood pressure °history of blood clots °history of stroke °low levels of folate, iron, or vitamin B12 in the blood °seizures °an unusual or allergic reaction to erythropoietin, albumin, benzyl alcohol, hamster proteins, other medicines, foods, dyes, or preservatives °pregnant or trying to get pregnant °breast-feeding °How should I use this medication? °This medicine is for injection into a vein or under the skin. It is usually given by a health care professional in a hospital or clinic setting. °If you get this medicine at home, you will be taught how to prepare and give this medicine. Use exactly as directed. Take your medicine at regular intervals. Do not take your medicine more often than directed. °It is important that you put your used needles and syringes in a special sharps container. Do not put them in a trash can. If you do not have a sharps container, call your pharmacist or healthcare provider to get one. °A special MedGuide will be given to you by the pharmacist with each prescription and refill. Be sure to read this information carefully each time. °Talk to your pediatrician regarding the use of this medicine in children. While this drug may be prescribed for selected conditions, precautions do apply. °Overdosage: If you think you have taken too much of this medicine contact a poison control center or emergency  room at once. °NOTE: This medicine is only for you. Do not share this medicine with others. °What if I miss a dose? °If you miss a dose, take it as soon as you can. If it is almost time for your next dose, take only that dose. Do not take double or extra doses. °What may interact with this medication? °Interactions have not been studied. °This list may not describe all possible interactions. Give your health care provider a list of all the medicines, herbs, non-prescription drugs, or dietary supplements you use. Also tell them if you smoke, drink alcohol, or use illegal drugs. Some items may interact with your medicine. °What should I watch for while using this medication? °Your condition will be monitored carefully while you are receiving this medicine. °You may need blood work done while you are taking this medicine. °This medicine may cause a decrease in vitamin B6. You should make sure that you get enough vitamin B6 while you are taking this medicine. Discuss the foods you eat and the vitamins you take with your health care professional. °What side effects may I notice from receiving this medication? °Side effects that you should report to your doctor or health care professional as soon as possible: °allergic reactions like skin rash, itching or hives, swelling of the face, lips, or tongue °seizures °signs and symptoms of a blood clot such as breathing problems; changes in vision; chest pain; severe, sudden headache; pain, swelling, warmth in the leg; trouble speaking; sudden numbness or weakness of the face, arm or leg °signs and symptoms of a stroke like   changes in vision; confusion; trouble speaking or understanding; severe headaches; sudden numbness or weakness of the face, arm or leg; trouble walking; dizziness; loss of balance or coordination °Side effects that usually do not require medical attention (report to your doctor or health care professional if they continue or are  bothersome): °chills °cough °dizziness °fever °headaches °joint pain °muscle cramps °muscle pain °nausea, vomiting °pain, redness, or irritation at site where injected °This list may not describe all possible side effects. Call your doctor for medical advice about side effects. You may report side effects to FDA at 1-800-FDA-1088. °Where should I keep my medication? °Keep out of the reach of children. °Store in a refrigerator between 2 and 8 degrees C (36 and 46 degrees F). Do not freeze or shake. Throw away any unused portion if using a single-dose vial. Multi-dose vials can be kept in the refrigerator for up to 21 days after the initial dose. Throw away unused medicine. °NOTE: This sheet is a summary. It may not cover all possible information. If you have questions about this medicine, talk to your doctor, pharmacist, or health care provider. °© 2022 [employer] (2017-04-03 00:00:00) ° °

## 2021-10-27 ENCOUNTER — Encounter: Payer: Self-pay | Admitting: Physician Assistant

## 2021-10-27 NOTE — Progress Notes (Signed)
Called patient's sister [name] to discuss [employer] in detail and how expenses are covered. She verbalized understanding. ? ?Discussed approved copay assistance and he would receive an approval letter in the mail for his records only. ? ?Discussed approved financial assistance from [facility]. ? ?She and patient are very appreciative. ? ?She has my card for any additional financial questions or concerns. ?

## 2021-10-29 ENCOUNTER — Encounter: Payer: Self-pay | Admitting: Hematology and Oncology

## 2021-11-02 ENCOUNTER — Inpatient Hospital Stay: Payer: Medicare Other

## 2021-11-02 ENCOUNTER — Telehealth: Payer: Self-pay | Admitting: *Deleted

## 2021-11-02 ENCOUNTER — Other Ambulatory Visit: Payer: Self-pay

## 2021-11-02 VITALS — BP 157/72 | HR 96 | Temp 98.7°F | Resp 18

## 2021-11-02 DIAGNOSIS — D4620 Refractory anemia with excess of blasts, unspecified: Secondary | ICD-10-CM

## 2021-11-02 DIAGNOSIS — E8809 Other disorders of plasma-protein metabolism, not elsewhere classified: Secondary | ICD-10-CM | POA: Diagnosis not present

## 2021-11-02 DIAGNOSIS — R7989 Other specified abnormal findings of blood chemistry: Secondary | ICD-10-CM | POA: Diagnosis not present

## 2021-11-02 DIAGNOSIS — R600 Localized edema: Secondary | ICD-10-CM | POA: Diagnosis not present

## 2021-11-02 DIAGNOSIS — Z79899 Other long term (current) drug therapy: Secondary | ICD-10-CM | POA: Diagnosis not present

## 2021-11-02 DIAGNOSIS — Z87891 Personal history of nicotine dependence: Secondary | ICD-10-CM | POA: Diagnosis not present

## 2021-11-02 LAB — CMP (CANCER CENTER ONLY)
ALT: 16 U/L (ref 0–44)
AST: 38 U/L (ref 15–41)
Albumin: 3.2 g/dL — ABNORMAL LOW (ref 3.5–5.0)
Alkaline Phosphatase: 81 U/L (ref 38–126)
Anion gap: 8 (ref 5–15)
BUN: 27 mg/dL — ABNORMAL HIGH (ref 8–23)
CO2: 23 mmol/L (ref 22–32)
Calcium: 9.8 mg/dL (ref 8.9–10.3)
Chloride: 109 mmol/L (ref 98–111)
Creatinine: 1.85 mg/dL — ABNORMAL HIGH (ref 0.61–1.24)
GFR, Estimated: 39 mL/min — ABNORMAL LOW (ref >60)
Glucose, Bld: 96 mg/dL (ref 70–99)
Potassium: 3.8 mmol/L (ref 3.5–5.1)
Sodium: 140 mmol/L (ref 135–145)
Total Bilirubin: 6.7 mg/dL (ref 0.3–1.2)
Total Protein: 7.4 g/dL (ref 6.5–8.1)

## 2021-11-02 LAB — CBC WITH DIFFERENTIAL (CANCER CENTER ONLY)
Abs Immature Granulocytes: 0.02 10*3/uL (ref 0.00–0.07)
Basophils Absolute: 0 10*3/uL (ref 0.0–0.1)
Basophils Relative: 0 %
Eosinophils Absolute: 0.1 10*3/uL (ref 0.0–0.5)
Eosinophils Relative: 2 %
HCT: 34.3 % — ABNORMAL LOW (ref 39.0–52.0)
Hemoglobin: 10.6 g/dL — ABNORMAL LOW (ref 13.0–17.0)
Immature Granulocytes: 0 %
Lymphocytes Relative: 15 %
Lymphs Abs: 0.8 10*3/uL (ref 0.7–4.0)
MCH: 30.5 pg (ref 26.0–34.0)
MCHC: 30.9 g/dL (ref 30.0–36.0)
MCV: 98.8 fL (ref 80.0–100.0)
Monocytes Absolute: 0.5 10*3/uL (ref 0.1–1.0)
Monocytes Relative: 10 %
Neutro Abs: 3.7 10*3/uL (ref 1.7–7.7)
Neutrophils Relative %: 73 %
Platelet Count: 92 10*3/uL — ABNORMAL LOW (ref 150–400)
RBC: 3.47 MIL/uL — ABNORMAL LOW (ref 4.22–5.81)
RDW: 20.1 % — ABNORMAL HIGH (ref 11.5–15.5)
WBC Count: 5.2 10*3/uL (ref 4.0–10.5)
nRBC: 0 % (ref 0.0–0.2)

## 2021-11-02 MED ORDER — EPOETIN ALFA-EPBX 40000 UNIT/ML IJ SOLN
40000.0000 [IU] | Freq: Once | INTRAMUSCULAR | Status: AC
  Administered 2021-11-02: 40000 [IU] via SUBCUTANEOUS
  Filled 2021-11-02: qty 1

## 2021-11-02 MED ORDER — EPOETIN ALFA-EPBX 20000 UNIT/ML IJ SOLN
20000.0000 [IU] | Freq: Once | INTRAMUSCULAR | Status: AC
  Administered 2021-11-02: 20000 [IU] via SUBCUTANEOUS
  Filled 2021-11-02: qty 1

## 2021-11-02 NOTE — Progress Notes (Signed)
Pt came in for Retacrit, pulse was 154. Dede Query PA was made aware and said to recheck in 15 mins. Recheck was 96 on the dinamap, and 92 by radial pulse. Pt is asymptomatic, seems like it may have been the machine reading incorrectly. Dede Query, PA gave the okay to proceed with injection today. Pt stable at discharge.  ?

## 2021-11-02 NOTE — Patient Instructions (Signed)
Epoetin Alfa injection °What is this medication? °EPOETIN ALFA (e POE e tin AL fa) helps your body make more red blood cells. This medicine is used to treat anemia caused by chronic kidney disease, cancer chemotherapy, or HIV-therapy. It may also be used before surgery if you have anemia. °This medicine may be used for other purposes; ask your health care provider or pharmacist if you have questions. °COMMON BRAND NAME(S): Epogen, Procrit, Retacrit °What should I tell my care team before I take this medication? °They need to know if you have any of these conditions: °cancer °heart disease °high blood pressure °history of blood clots °history of stroke °low levels of folate, iron, or vitamin B12 in the blood °seizures °an unusual or allergic reaction to erythropoietin, albumin, benzyl alcohol, hamster proteins, other medicines, foods, dyes, or preservatives °pregnant or trying to get pregnant °breast-feeding °How should I use this medication? °This medicine is for injection into a vein or under the skin. It is usually given by a health care professional in a hospital or clinic setting. °If you get this medicine at home, you will be taught how to prepare and give this medicine. Use exactly as directed. Take your medicine at regular intervals. Do not take your medicine more often than directed. °It is important that you put your used needles and syringes in a special sharps container. Do not put them in a trash can. If you do not have a sharps container, call your pharmacist or healthcare provider to get one. °A special MedGuide will be given to you by the pharmacist with each prescription and refill. Be sure to read this information carefully each time. °Talk to your pediatrician regarding the use of this medicine in children. While this drug may be prescribed for selected conditions, precautions do apply. °Overdosage: If you think you have taken too much of this medicine contact a poison control center or emergency  room at once. °NOTE: This medicine is only for you. Do not share this medicine with others. °What if I miss a dose? °If you miss a dose, take it as soon as you can. If it is almost time for your next dose, take only that dose. Do not take double or extra doses. °What may interact with this medication? °Interactions have not been studied. °This list may not describe all possible interactions. Give your health care provider a list of all the medicines, herbs, non-prescription drugs, or dietary supplements you use. Also tell them if you smoke, drink alcohol, or use illegal drugs. Some items may interact with your medicine. °What should I watch for while using this medication? °Your condition will be monitored carefully while you are receiving this medicine. °You may need blood work done while you are taking this medicine. °This medicine may cause a decrease in vitamin B6. You should make sure that you get enough vitamin B6 while you are taking this medicine. Discuss the foods you eat and the vitamins you take with your health care professional. °What side effects may I notice from receiving this medication? °Side effects that you should report to your doctor or health care professional as soon as possible: °allergic reactions like skin rash, itching or hives, swelling of the face, lips, or tongue °seizures °signs and symptoms of a blood clot such as breathing problems; changes in vision; chest pain; severe, sudden headache; pain, swelling, warmth in the leg; trouble speaking; sudden numbness or weakness of the face, arm or leg °signs and symptoms of a stroke like   changes in vision; confusion; trouble speaking or understanding; severe headaches; sudden numbness or weakness of the face, arm or leg; trouble walking; dizziness; loss of balance or coordination °Side effects that usually do not require medical attention (report to your doctor or health care professional if they continue or are  bothersome): °chills °cough °dizziness °fever °headaches °joint pain °muscle cramps °muscle pain °nausea, vomiting °pain, redness, or irritation at site where injected °This list may not describe all possible side effects. Call your doctor for medical advice about side effects. You may report side effects to FDA at 1-800-FDA-1088. °Where should I keep my medication? °Keep out of the reach of children. °Store in a refrigerator between 2 and 8 degrees C (36 and 46 degrees F). Do not freeze or shake. Throw away any unused portion if using a single-dose vial. Multi-dose vials can be kept in the refrigerator for up to 21 days after the initial dose. Throw away unused medicine. °NOTE: This sheet is a summary. It may not cover all possible information. If you have questions about this medicine, talk to your doctor, pharmacist, or health care provider. °© 2022 [employer] (2017-04-03 00:00:00) ° °

## 2021-11-02 NOTE — Telephone Encounter (Signed)
CRITICAL VALUE STICKER ? ?CRITICAL VALUE: TBili 6.7 ? ?RECEIVER (on-site recipient of call): Drucie Ip, RN ? ?DATE & TIME NOTIFIED: 11/02/21  1:30 pm ? ?MESSENGER (representative from lab): Ulice Dash ? ?MD NOTIFIED: Dede Query, PA ? ?TIME OF NOTIFICATION:1:45 pm ? ?RESPONSE:   ?

## 2021-11-08 ENCOUNTER — Other Ambulatory Visit: Payer: Self-pay | Admitting: Orthopedic Surgery

## 2021-11-08 ENCOUNTER — Encounter: Payer: Self-pay | Admitting: Orthopedic Surgery

## 2021-11-08 DIAGNOSIS — Z01818 Encounter for other preprocedural examination: Secondary | ICD-10-CM
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
## 2021-11-10 ENCOUNTER — Inpatient Hospital Stay (HOSPITAL_BASED_OUTPATIENT_CLINIC_OR_DEPARTMENT_OTHER): Payer: Medicare Other | Admitting: Physician Assistant

## 2021-11-10 ENCOUNTER — Inpatient Hospital Stay: Payer: Medicare Other | Admitting: Hematology and Oncology

## 2021-11-10 ENCOUNTER — Telehealth: Payer: Self-pay | Admitting: *Deleted

## 2021-11-10 ENCOUNTER — Inpatient Hospital Stay: Payer: Medicare Other

## 2021-11-10 ENCOUNTER — Other Ambulatory Visit: Payer: Self-pay

## 2021-11-10 VITALS — BP 160/82 | HR 96 | Temp 98.3°F | Resp 16 | Ht 68.0 in | Wt 137.2 lb

## 2021-11-10 DIAGNOSIS — R7989 Other specified abnormal findings of blood chemistry: Secondary | ICD-10-CM

## 2021-11-10 DIAGNOSIS — D4620 Refractory anemia with excess of blasts, unspecified: Secondary | ICD-10-CM

## 2021-11-10 DIAGNOSIS — R600 Localized edema: Secondary | ICD-10-CM | POA: Diagnosis not present

## 2021-11-10 DIAGNOSIS — Z87891 Personal history of nicotine dependence: Secondary | ICD-10-CM | POA: Diagnosis not present

## 2021-11-10 DIAGNOSIS — Z79899 Other long term (current) drug therapy: Secondary | ICD-10-CM | POA: Diagnosis not present

## 2021-11-10 DIAGNOSIS — E8809 Other disorders of plasma-protein metabolism, not elsewhere classified: Secondary | ICD-10-CM | POA: Diagnosis not present

## 2021-11-10 LAB — CMP (CANCER CENTER ONLY)
ALT: 15 U/L (ref 0–44)
AST: 35 U/L (ref 15–41)
Albumin: 3.2 g/dL — ABNORMAL LOW (ref 3.5–5.0)
Alkaline Phosphatase: 79 U/L (ref 38–126)
Anion gap: 9 (ref 5–15)
BUN: 28 mg/dL — ABNORMAL HIGH (ref 8–23)
CO2: 23 mmol/L (ref 22–32)
Calcium: 9.7 mg/dL (ref 8.9–10.3)
Chloride: 108 mmol/L (ref 98–111)
Creatinine: 2.1 mg/dL — ABNORMAL HIGH (ref 0.61–1.24)
GFR, Estimated: 34 mL/min — ABNORMAL LOW (ref >60)
Glucose, Bld: 98 mg/dL (ref 70–99)
Potassium: 3.9 mmol/L (ref 3.5–5.1)
Sodium: 140 mmol/L (ref 135–145)
Total Bilirubin: 6.3 mg/dL (ref 0.3–1.2)
Total Protein: 7.6 g/dL (ref 6.5–8.1)

## 2021-11-10 LAB — CBC WITH DIFFERENTIAL (CANCER CENTER ONLY)
Abs Immature Granulocytes: 0.02 10*3/uL (ref 0.00–0.07)
Basophils Absolute: 0 10*3/uL (ref 0.0–0.1)
Basophils Relative: 0 %
Eosinophils Absolute: 0.1 10*3/uL (ref 0.0–0.5)
Eosinophils Relative: 2 %
HCT: 35.6 % — ABNORMAL LOW (ref 39.0–52.0)
Hemoglobin: 11 g/dL — ABNORMAL LOW (ref 13.0–17.0)
Immature Granulocytes: 0 %
Lymphocytes Relative: 18 %
Lymphs Abs: 1 10*3/uL (ref 0.7–4.0)
MCH: 30.5 pg (ref 26.0–34.0)
MCHC: 30.9 g/dL (ref 30.0–36.0)
MCV: 98.6 fL (ref 80.0–100.0)
Monocytes Absolute: 0.6 10*3/uL (ref 0.1–1.0)
Monocytes Relative: 12 %
Neutro Abs: 3.6 10*3/uL (ref 1.7–7.7)
Neutrophils Relative %: 68 %
Platelet Count: 96 10*3/uL — ABNORMAL LOW (ref 150–400)
RBC: 3.61 MIL/uL — ABNORMAL LOW (ref 4.22–5.81)
RDW: 20.1 % — ABNORMAL HIGH (ref 11.5–15.5)
WBC Count: 5.4 10*3/uL (ref 4.0–10.5)
nRBC: 0 % (ref 0.0–0.2)

## 2021-11-10 MED ORDER — SODIUM CHLORIDE 0.9 % IV SOLN: Freq: Once | INTRAVENOUS | Status: AC

## 2021-11-10 MED ORDER — EPOETIN ALFA-EPBX 20000 UNIT/ML IJ SOLN
20000.0000 [IU] | Freq: Once | INTRAMUSCULAR | Status: AC
  Administered 2021-11-10: 20000 [IU] via SUBCUTANEOUS
  Filled 2021-11-10: qty 1

## 2021-11-10 MED ORDER — EPOETIN ALFA-EPBX 40000 UNIT/ML IJ SOLN
40000.0000 [IU] | Freq: Once | INTRAMUSCULAR | Status: AC
  Administered 2021-11-10: 40000 [IU] via SUBCUTANEOUS
  Filled 2021-11-10: qty 1

## 2021-11-10 NOTE — Telephone Encounter (Signed)
CRITICAL VALUE STICKER ? ?CRITICAL VALUE: T.Bili 6.3 ? ?RECEIVER (on-site recipient of call): Drucie Ip, RN ? ?DATE & TIME NOTIFIED: 11/10/21  2:15 pm ? ?MESSENGER (representative from lab): Ulice Dash ? ?MD NOTIFIED:  Dede Query, PA ? ?TIME OF NOTIFICATION: 2:17 pm ? ?RESPONSE:  Pt made aware per Dede Query, PA ?

## 2021-11-10 NOTE — Progress Notes (Signed)
Pt staying in clinic for IV fluids as his creatinine is 2.10 ?Peripheral IV started per Mena Goes, RN and 1 liter of fluid infused over 2 hours. Pt resting well while fluids infusing. ?Pt in recliner with blanket and call bell within reach ?

## 2021-11-10 NOTE — Progress Notes (Signed)
Increased IV fluids to 999/hr as patient wants to go home. OK per Dede Query, PA ?

## 2021-11-10 NOTE — Patient Instructions (Signed)
Epoetin Alfa injection °What is this medication? °EPOETIN ALFA (e POE e tin AL fa) helps your body make more red blood cells. This medicine is used to treat anemia caused by chronic kidney disease, cancer chemotherapy, or HIV-therapy. It may also be used before surgery if you have anemia. °This medicine may be used for other purposes; ask your health care provider or pharmacist if you have questions. °COMMON BRAND NAME(S): Epogen, Procrit, Retacrit °What should I tell my care team before I take this medication? °They need to know if you have any of these conditions: °cancer °heart disease °high blood pressure °history of blood clots °history of stroke °low levels of folate, iron, or vitamin B12 in the blood °seizures °an unusual or allergic reaction to erythropoietin, albumin, benzyl alcohol, hamster proteins, other medicines, foods, dyes, or preservatives °pregnant or trying to get pregnant °breast-feeding °How should I use this medication? °This medicine is for injection into a vein or under the skin. It is usually given by a health care professional in a hospital or clinic setting. °If you get this medicine at home, you will be taught how to prepare and give this medicine. Use exactly as directed. Take your medicine at regular intervals. Do not take your medicine more often than directed. °It is important that you put your used needles and syringes in a special sharps container. Do not put them in a trash can. If you do not have a sharps container, call your pharmacist or healthcare provider to get one. °A special MedGuide will be given to you by the pharmacist with each prescription and refill. Be sure to read this information carefully each time. °Talk to your pediatrician regarding the use of this medicine in children. While this drug may be prescribed for selected conditions, precautions do apply. °Overdosage: If you think you have taken too much of this medicine contact a poison control center or emergency  room at once. °NOTE: This medicine is only for you. Do not share this medicine with others. °What if I miss a dose? °If you miss a dose, take it as soon as you can. If it is almost time for your next dose, take only that dose. Do not take double or extra doses. °What may interact with this medication? °Interactions have not been studied. °This list may not describe all possible interactions. Give your health care provider a list of all the medicines, herbs, non-prescription drugs, or dietary supplements you use. Also tell them if you smoke, drink alcohol, or use illegal drugs. Some items may interact with your medicine. °What should I watch for while using this medication? °Your condition will be monitored carefully while you are receiving this medicine. °You may need blood work done while you are taking this medicine. °This medicine may cause a decrease in vitamin B6. You should make sure that you get enough vitamin B6 while you are taking this medicine. Discuss the foods you eat and the vitamins you take with your health care professional. °What side effects may I notice from receiving this medication? °Side effects that you should report to your doctor or health care professional as soon as possible: °allergic reactions like skin rash, itching or hives, swelling of the face, lips, or tongue °seizures °signs and symptoms of a blood clot such as breathing problems; changes in vision; chest pain; severe, sudden headache; pain, swelling, warmth in the leg; trouble speaking; sudden numbness or weakness of the face, arm or leg °signs and symptoms of a stroke like   changes in vision; confusion; trouble speaking or understanding; severe headaches; sudden numbness or weakness of the face, arm or leg; trouble walking; dizziness; loss of balance or coordination °Side effects that usually do not require medical attention (report to your doctor or health care professional if they continue or are  bothersome): °chills °cough °dizziness °fever °headaches °joint pain °muscle cramps °muscle pain °nausea, vomiting °pain, redness, or irritation at site where injected °This list may not describe all possible side effects. Call your doctor for medical advice about side effects. You may report side effects to FDA at 1-800-FDA-1088. °Where should I keep my medication? °Keep out of the reach of children. °Store in a refrigerator between 2 and 8 degrees C (36 and 46 degrees F). Do not freeze or shake. Throw away any unused portion if using a single-dose vial. Multi-dose vials can be kept in the refrigerator for up to 21 days after the initial dose. Throw away unused medicine. °NOTE: This sheet is a summary. It may not cover all possible information. If you have questions about this medicine, talk to your doctor, pharmacist, or health care provider. °© 2022 [employer] (2017-04-03 00:00:00) ° °

## 2021-11-10 NOTE — Progress Notes (Signed)
Per Aura Fey RN and Dede Query PA, pt to get Retacrit with Hgb at 11 still since pt needs surgery in the near future.  ? ?
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
## 2021-11-11 ENCOUNTER — Encounter
Admission: RE | Admit: 2021-11-11 | Discharge: 2021-11-11 | Disposition: A | Discharge status: Home / Self Care | Payer: Medicare Other | Source: Ambulatory Visit | Attending: Orthopedic Surgery | Admitting: Orthopedic Surgery

## 2021-11-11 VITALS — BP 147/79 | HR 99 | Resp 16

## 2021-11-11 DIAGNOSIS — K7460 Unspecified cirrhosis of liver: Secondary | ICD-10-CM | POA: Insufficient documentation

## 2021-11-11 DIAGNOSIS — Z01812 Encounter for preprocedural laboratory examination: Secondary | ICD-10-CM | POA: Insufficient documentation

## 2021-11-11 DIAGNOSIS — Z01818 Encounter for other preprocedural examination: Secondary | ICD-10-CM

## 2021-11-11 HISTORY — DX: Thrombocytopenia, unspecified: D69.6

## 2021-11-11 HISTORY — DX: Acute kidney failure, unspecified: N17.9

## 2021-11-11 HISTORY — DX: Acidosis, unspecified: E87.20

## 2021-11-11 HISTORY — DX: Other myelodysplastic syndromes: D46.Z

## 2021-11-11 HISTORY — DX: Suicidal ideations: R45.851

## 2021-11-11 HISTORY — DX: Gastrointestinal hemorrhage, unspecified: K92.2

## 2021-11-11 HISTORY — DX: Unspecified cirrhosis of liver: K74.60

## 2021-11-11 LAB — URINALYSIS, ROUTINE W REFLEX MICROSCOPIC
Bacteria, UA: NONE SEEN
Bilirubin Urine: NEGATIVE
Glucose, UA: NEGATIVE mg/dL
Ketones, ur: NEGATIVE mg/dL
Leukocytes,Ua: NEGATIVE
Nitrite: NEGATIVE
Protein, ur: 30 mg/dL — AB
Specific Gravity, Urine: 1.014 (ref 1.005–1.030)
pH: 6 (ref 5.0–8.0)

## 2021-11-11 LAB — SURGICAL PCR SCREEN
MRSA, PCR: NEGATIVE
Staphylococcus aureus: NEGATIVE

## 2021-11-11 LAB — APTT: aPTT: 44 seconds — ABNORMAL HIGH (ref 24–36)

## 2021-11-11 LAB — PROTIME-INR
INR: 1.6 — ABNORMAL HIGH (ref 0.8–1.2)
Prothrombin Time: 19.1 seconds — ABNORMAL HIGH (ref 11.4–15.2)

## 2021-11-11 NOTE — Patient Instructions (Addendum)
Your procedure is scheduled on:11-21-21 Monday ?Report to the Registration Desk on the 1st floor of the [facility].Then proceed to the 2nd floor Surgery Desk in the [facility] ?To find out your arrival time, please call [phone] between 1PM - 3PM on:11-18-21 Friday ? ?REMEMBER: ?Instructions that are not followed completely may result in serious medical risk, up to and including death; or upon the discretion of your surgeon and anesthesiologist your surgery may need to be rescheduled. ? ?Do not eat food after midnight the night before surgery.  ?No gum chewing, lozengers or hard candies. ? ?You may however, drink CLEAR liquids up to 2 hours before you are scheduled to arrive for your surgery. Do not drink anything within 2 hours of your scheduled arrival time. ? ?Clear liquids include: ?- water  ?- apple juice without pulp ?- gatorade (not RED colors) ?- black coffee or tea (Do NOT add milk or creamers to the coffee or tea) ?Do NOT drink anything that is not on this list. ? ?TAKE THESE MEDICATIONS THE MORNING OF SURGERY WITH A SIP OF WATER: ?-Famotidine-Ca Carb-Mag Hydrox (PEPCID COMPLETE )-take one the night before and one the morning of surgery ?-You may ALPRAZolam (XANAX) the day of surgery if needed ? ?One week prior to surgery:Last dose on 11-13-21 Sunday ?Stop Anti-inflammatories (NSAIDS) such as Advil, Aleve, Ibuprofen, Motrin, Naproxen, Naprosyn and Aspirin based products such as Excedrin, Goodys Powder, BC Powder.You may however, continue to take Tylenol/ HYDROcodone-acetaminophen (NORCO/VICODIN) if needed for pain up until the day of surgery. ? ?Stop ANY OVER THE COUNTER supplements/vitamins 7 days prior to surgery (LEG CRAMPS)-Continue your iron pill ? ?No Alcohol for 24 hours before or after surgery. ? ?No Smoking including e-cigarettes for 24 hours prior to surgery.  ?No chewable tobacco products for at least 6 hours prior to surgery.  ?No nicotine patches on the day of surgery. ? ?Do not use any  "recreational" drugs for at least a week prior to your surgery.  ?Please be advised that the combination of cocaine and anesthesia may have negative outcomes, up to and including death. ?If you test positive for cocaine, your surgery will be cancelled. ? ?On the morning of surgery brush your teeth with toothpaste and water, you may rinse your mouth with mouthwash if you wish. ?Do not swallow any toothpaste or mouthwash. ? ?Use CHG Soap as directed on instruction sheet. ? ?Do not wear jewelry, make-up, hairpins, clips or nail polish. ? ?Do not wear lotions, powders, or perfumes.  ? ?Do not shave body from the neck down 48 hours prior to surgery just in case you cut yourself which could leave a site for infection.  ?Also, freshly shaved skin may become irritated if using the CHG soap. ? ?Contact lenses, hearing aids and dentures may not be worn into surgery. ? ?Do not bring valuables to the hospital. [facility] is not responsible for any missing/lost belongings or valuables.  ? ?Notify your doctor if there is any change in your medical condition (cold, fever, infection). ? ?Wear comfortable clothing (specific to your surgery type) to the hospital. ? ?After surgery, you can help prevent lung complications by doing breathing exercises.  ?Take deep breaths and cough every 1-2 hours. Your doctor may order a device called an Incentive Spirometer to help you take deep breaths. ?When coughing or sneezing, hold a pillow firmly against your incision with both hands. This is called ?splinting.? Doing this helps protect your incision. It also decreases belly discomfort. ? ?  If you are being admitted to the hospital overnight, leave your suitcase in the car. ?After surgery it may be brought to your room. ? ?If you are being discharged the day of surgery, you will not be allowed to drive home. ?You will need a responsible adult (18 years or older) to drive you home and stay with you that night.  ? ?If you are taking public  transportation, you will need to have a responsible adult (18 years or older) with you. ?Please confirm with your physician that it is acceptable to use public transportation.  ? ?Please call the [facility] at [phone] if you have any questions about these instructions. ? ?Surgery Visitation Policy: ? ?Patients undergoing a surgery or procedure may have two family members or support persons with them as long as the person is not COVID-19 positive or experiencing its symptoms.  ? ?Inpatient Visitation:   ? ?Visiting hours are 7 a.m. to 8 p.m. ?Up to four visitors are allowed at one time in a patient room, including children. The visitors may rotate out with other people during the day. One designated support person (adult) may remain overnight.  ?

## 2021-11-11 NOTE — Progress Notes (Signed)
?  [facility] ?Perioperative Services: Pre-Admission/Anesthesia Testing ? ?Abnormal Lab Notification ?  ?Date: 11/11/21 ? ?Name: [name] ?MRN:   [id number] ? ?Re: Abnormal labs noted during PAT appointment  ? ?Notified:  ?Provider Name Provider Role Notification Mode  ?Lovell Sheehan, MD Orthopedics (Surgeon) Routed and/or faxed via [facility]  ? ?Clinical Information and Notes:  ?ABNORMAL LAB VALUE(S): ?Lab Results  ?Component Value Date  ? INR 1.6 (H) 11/11/2021  ? LABPROT 19.1 (H) 11/11/2021  ?  ?Lab Results  ?Component Value Date  ? CREATININE 2.10 (H) 11/10/2021  ?  ?Lab Results  ?Component Value Date  ? PLT 96 (L) 11/10/2021  ?  ?Lab Results  ?Component Value Date  ? BILITOT 6.3 (HH) 11/10/2021  ? ?Clinical Notes:  ?[name] is scheduled for a TOTAL HIP REVISION (Right: Hip) on 11/21/2021. Patient has an underlying low grade myelodysplastic syndrome. Patient under care of hematology/oncology at [facility].  Patient was seen by hematology yesterday (11/10/2021).  Given patient's increased total bilirubin, patient has been referred to GI, however he is adamant that he wants to have his hip surgery done before being evaluated by gastroenterology.  Reached out to hematology/oncology provider to discuss; see response below: ? ? ? ?Sending above results to primary attending surgeon for review. ? ?Honor Loh, MSN, APRN, FNP-C, CEN ?[place]  ?Peri-operative Services Nurse Practitioner ?Phone: [phone] ?Fax: [phone] ?11/11/21 3:38 PM ? ?

## 2021-11-21 ENCOUNTER — Inpatient Hospital Stay: Payer: Medicare Other | Admitting: Urgent Care

## 2021-11-21 ENCOUNTER — Other Ambulatory Visit: Payer: Self-pay

## 2021-11-21 ENCOUNTER — Encounter
Admission: RE | Disposition: A | Discharge status: Home / Self Care | Payer: Self-pay | Source: Home / Self Care | Attending: Orthopedic Surgery

## 2021-11-21 ENCOUNTER — Encounter: Payer: Self-pay | Admitting: Orthopedic Surgery

## 2021-11-21 ENCOUNTER — Inpatient Hospital Stay
Admission: RE | Admit: 2021-11-21 | Discharge: 2021-11-24 | DRG: 467 | Disposition: A | Discharge status: Home / Self Care | Payer: Medicare Other | Attending: Orthopedic Surgery | Admitting: Orthopedic Surgery

## 2021-11-21 ENCOUNTER — Inpatient Hospital Stay: Payer: Medicare Other

## 2021-11-21 DIAGNOSIS — M24859 Other specific joint derangements of unspecified hip, not elsewhere classified: Secondary | ICD-10-CM | POA: Diagnosis not present

## 2021-11-21 DIAGNOSIS — R4182 Altered mental status, unspecified: Secondary | ICD-10-CM | POA: Diagnosis not present

## 2021-11-21 DIAGNOSIS — Z885 Allergy status to narcotic agent status: Secondary | ICD-10-CM | POA: Diagnosis not present

## 2021-11-21 DIAGNOSIS — R0602 Shortness of breath: Secondary | ICD-10-CM | POA: Diagnosis not present

## 2021-11-21 DIAGNOSIS — R000 Tachycardia, unspecified: Secondary | ICD-10-CM | POA: Diagnosis present

## 2021-11-21 DIAGNOSIS — R7989 Other specified abnormal findings of blood chemistry: Secondary | ICD-10-CM | POA: Diagnosis present

## 2021-11-21 DIAGNOSIS — Y831 Surgical operation with implant of artificial internal device as the cause of abnormal reaction of the patient, or of later complication, without mention of misadventure at the time of the procedure: Secondary | ICD-10-CM | POA: Diagnosis present

## 2021-11-21 DIAGNOSIS — I248 Other forms of acute ischemic heart disease: Secondary | ICD-10-CM | POA: Diagnosis present

## 2021-11-21 DIAGNOSIS — Z882 Allergy status to sulfonamides status: Secondary | ICD-10-CM | POA: Diagnosis not present

## 2021-11-21 DIAGNOSIS — Z87891 Personal history of nicotine dependence: Secondary | ICD-10-CM | POA: Diagnosis not present

## 2021-11-21 DIAGNOSIS — I2510 Atherosclerotic heart disease of native coronary artery without angina pectoris: Secondary | ICD-10-CM | POA: Diagnosis present

## 2021-11-21 DIAGNOSIS — J449 Chronic obstructive pulmonary disease, unspecified: Secondary | ICD-10-CM | POA: Diagnosis present

## 2021-11-21 DIAGNOSIS — R600 Localized edema: Secondary | ICD-10-CM | POA: Diagnosis not present

## 2021-11-21 DIAGNOSIS — F32A Depression, unspecified: Secondary | ICD-10-CM | POA: Diagnosis present

## 2021-11-21 DIAGNOSIS — R778 Other specified abnormalities of plasma proteins: Secondary | ICD-10-CM | POA: Diagnosis present

## 2021-11-21 DIAGNOSIS — Z7982 Long term (current) use of aspirin: Secondary | ICD-10-CM

## 2021-11-21 DIAGNOSIS — D696 Thrombocytopenia, unspecified: Secondary | ICD-10-CM | POA: Diagnosis present

## 2021-11-21 DIAGNOSIS — I479 Paroxysmal tachycardia, unspecified: Secondary | ICD-10-CM | POA: Diagnosis present

## 2021-11-21 DIAGNOSIS — N179 Acute kidney failure, unspecified: Secondary | ICD-10-CM | POA: Diagnosis present

## 2021-11-21 DIAGNOSIS — K219 Gastro-esophageal reflux disease without esophagitis: Secondary | ICD-10-CM | POA: Diagnosis present

## 2021-11-21 DIAGNOSIS — T84030A Mechanical loosening of internal right hip prosthetic joint, initial encounter: Principal | ICD-10-CM | POA: Diagnosis present

## 2021-11-21 DIAGNOSIS — Z888 Allergy status to other drugs, medicaments and biological substances status: Secondary | ICD-10-CM | POA: Diagnosis not present

## 2021-11-21 DIAGNOSIS — K7460 Unspecified cirrhosis of liver: Secondary | ICD-10-CM | POA: Diagnosis present

## 2021-11-21 DIAGNOSIS — Z955 Presence of coronary angioplasty implant and graft: Secondary | ICD-10-CM

## 2021-11-21 DIAGNOSIS — J811 Chronic pulmonary edema: Secondary | ICD-10-CM | POA: Diagnosis present

## 2021-11-21 DIAGNOSIS — I252 Old myocardial infarction: Secondary | ICD-10-CM

## 2021-11-21 DIAGNOSIS — Z96641 Presence of right artificial hip joint: Secondary | ICD-10-CM | POA: Diagnosis not present

## 2021-11-21 DIAGNOSIS — N1832 Chronic kidney disease, stage 3b: Secondary | ICD-10-CM | POA: Diagnosis present

## 2021-11-21 DIAGNOSIS — Z9889 Other specified postprocedural states: Secondary | ICD-10-CM | POA: Diagnosis not present

## 2021-11-21 DIAGNOSIS — M7989 Other specified soft tissue disorders: Secondary | ICD-10-CM | POA: Diagnosis not present

## 2021-11-21 DIAGNOSIS — F419 Anxiety disorder, unspecified: Secondary | ICD-10-CM | POA: Diagnosis present

## 2021-11-21 DIAGNOSIS — Z471 Aftercare following joint replacement surgery: Secondary | ICD-10-CM | POA: Diagnosis not present

## 2021-11-21 DIAGNOSIS — D649 Anemia, unspecified: Secondary | ICD-10-CM | POA: Diagnosis present

## 2021-11-21 DIAGNOSIS — Z96642 Presence of left artificial hip joint: Secondary | ICD-10-CM | POA: Diagnosis present

## 2021-11-21 DIAGNOSIS — Z79899 Other long term (current) drug therapy: Secondary | ICD-10-CM

## 2021-11-21 DIAGNOSIS — Z96643 Presence of artificial hip joint, bilateral: Secondary | ICD-10-CM | POA: Diagnosis not present

## 2021-11-21 DIAGNOSIS — T84050A Periprosthetic osteolysis of internal prosthetic right hip joint, initial encounter: Secondary | ICD-10-CM | POA: Diagnosis not present

## 2021-11-21 HISTORY — PX: TOTAL HIP REVISION: SHX763

## 2021-11-21 LAB — PREPARE RBC (CROSSMATCH)

## 2021-11-21 SURGERY — TOTAL HIP REVISION
Anesthesia: Spinal | Site: Hip | Laterality: Right

## 2021-11-21 MED ORDER — BUPIVACAINE-EPINEPHRINE (PF) 0.25% -1:200000 IJ SOLN
INTRAMUSCULAR | Status: AC
  Filled 2021-11-21: qty 30

## 2021-11-21 MED ORDER — OXYCODONE HCL 5 MG PO TABS
ORAL_TABLET | ORAL | Status: AC
  Administered 2021-11-21: 15 mg via ORAL
  Filled 2021-11-21: qty 3

## 2021-11-21 MED ORDER — HYDROMORPHONE HCL 1 MG/ML IJ SOLN
INTRAMUSCULAR | Status: DC | PRN
  Administered 2021-11-21 (×2): .2 mg via INTRAVENOUS

## 2021-11-21 MED ORDER — TEMAZEPAM 15 MG PO CAPS
15.0000 mg | ORAL_CAPSULE | Freq: Every day | ORAL | Status: DC
  Filled 2021-11-21 (×2): qty 1

## 2021-11-21 MED ORDER — HYDROMORPHONE HCL 2 MG PO TABS: 2.0000 mg | ORAL_TABLET | ORAL | Status: DC | PRN

## 2021-11-21 MED ORDER — STERILE WATER FOR IRRIGATION IR SOLN
Status: DC | PRN
  Administered 2021-11-21: 1000 mL

## 2021-11-21 MED ORDER — METOCLOPRAMIDE HCL 10 MG PO TABS: 5.0000 mg | ORAL_TABLET | Freq: Three times a day (TID) | ORAL | Status: DC | PRN

## 2021-11-21 MED ORDER — PHENOL 1.4 % MT LIQD: 1.0000 | OROMUCOSAL | Status: DC | PRN

## 2021-11-21 MED ORDER — FAMOTIDINE-CA CARB-MAG HYDROX 10-800-165 MG PO CHEW: 1.0000 | CHEWABLE_TABLET | Freq: Every day | ORAL | Status: DC | PRN

## 2021-11-21 MED ORDER — ACETAMINOPHEN 10 MG/ML IV SOLN
INTRAVENOUS | Status: AC
  Filled 2021-11-21: qty 100

## 2021-11-21 MED ORDER — ROCURONIUM BROMIDE 10 MG/ML (PF) SYRINGE
PREFILLED_SYRINGE | INTRAVENOUS | Status: AC
  Filled 2021-11-21: qty 10

## 2021-11-21 MED ORDER — OXYCODONE HCL 5 MG PO TABS
5.0000 mg | ORAL_TABLET | ORAL | Status: DC | PRN
  Administered 2021-11-22 (×2): 5 mg via ORAL
  Administered 2021-11-22 – 2021-11-23 (×2): 10 mg via ORAL
  Filled 2021-11-21 (×2): qty 1
  Filled 2021-11-21 (×2): qty 2

## 2021-11-21 MED ORDER — MENTHOL 3 MG MT LOZG: 1.0000 | LOZENGE | OROMUCOSAL | Status: DC | PRN

## 2021-11-21 MED ORDER — SODIUM CHLORIDE 0.9 % IV SOLN: INTRAVENOUS | Status: DC | PRN

## 2021-11-21 MED ORDER — MIDAZOLAM HCL 2 MG/2ML IJ SOLN
INTRAMUSCULAR | Status: AC
Start: 2021-11-21 — End: ?
  Filled 2021-11-21: qty 2

## 2021-11-21 MED ORDER — PHENYLEPHRINE HCL (PRESSORS) 10 MG/ML IV SOLN
INTRAVENOUS | Status: AC
  Filled 2021-11-21: qty 1

## 2021-11-21 MED ORDER — FENTANYL CITRATE (PF) 100 MCG/2ML IJ SOLN
INTRAMUSCULAR | Status: AC
  Administered 2021-11-21: 25 ug via INTRAVENOUS
  Filled 2021-11-21: qty 2

## 2021-11-21 MED ORDER — ALUM & MAG HYDROXIDE-SIMETH 200-200-20 MG/5ML PO SUSP: 30.0000 mL | ORAL | Status: DC | PRN

## 2021-11-21 MED ORDER — HYDROMORPHONE HCL 2 MG PO TABS: 1.0000 mg | ORAL_TABLET | ORAL | Status: DC | PRN

## 2021-11-21 MED ORDER — MIRTAZAPINE 15 MG PO TABS
7.5000 mg | ORAL_TABLET | Freq: Every day | ORAL | Status: DC
  Administered 2021-11-21 – 2021-11-22 (×2): 7.5 mg via ORAL
  Filled 2021-11-21 (×3): qty 1

## 2021-11-21 MED ORDER — FENTANYL CITRATE (PF) 100 MCG/2ML IJ SOLN
INTRAMUSCULAR | Status: DC | PRN
Start: 2021-11-21 — End: 2021-11-21
  Administered 2021-11-21 (×2): 50 ug via INTRAVENOUS

## 2021-11-21 MED ORDER — ACETAMINOPHEN 10 MG/ML IV SOLN
INTRAVENOUS | Status: DC | PRN
  Administered 2021-11-21: 1000 mg via INTRAVENOUS

## 2021-11-21 MED ORDER — FENTANYL CITRATE (PF) 100 MCG/2ML IJ SOLN
25.0000 ug | INTRAMUSCULAR | Status: DC | PRN
  Administered 2021-11-21 (×3): 25 ug via INTRAVENOUS

## 2021-11-21 MED ORDER — ONDANSETRON HCL 4 MG/2ML IJ SOLN: 4.0000 mg | Freq: Four times a day (QID) | INTRAMUSCULAR | Status: DC | PRN

## 2021-11-21 MED ORDER — POVIDONE-IODINE 10 % EX SWAB
2.0000 "application " | Freq: Once | CUTANEOUS | Status: AC
  Administered 2021-11-21: 2 via TOPICAL

## 2021-11-21 MED ORDER — LACTATED RINGERS IV SOLN: INTRAVENOUS | Status: DC

## 2021-11-21 MED ORDER — PHENYLEPHRINE HCL (PRESSORS) 10 MG/ML IV SOLN
INTRAVENOUS | Status: DC | PRN
  Administered 2021-11-21: 100 ug via INTRAVENOUS
  Administered 2021-11-21 (×3): 200 ug via INTRAVENOUS

## 2021-11-21 MED ORDER — LACTATED RINGERS IV SOLN
INTRAVENOUS | Status: DC
Start: 2021-11-21 — End: 2021-11-21

## 2021-11-21 MED ORDER — PRONTOSAN WOUND IRRIGATION OPTIME
TOPICAL | Status: DC | PRN
  Administered 2021-11-21: 1 via TOPICAL

## 2021-11-21 MED ORDER — CALCIUM CARBONATE ANTACID 500 MG PO CHEW: 1.0000 | CHEWABLE_TABLET | Freq: Every day | ORAL | Status: DC | PRN

## 2021-11-21 MED ORDER — BISACODYL 10 MG RE SUPP
10.0000 mg | Freq: Every day | RECTAL | Status: DC | PRN
  Administered 2021-11-23: 10 mg via RECTAL
  Filled 2021-11-21: qty 1

## 2021-11-21 MED ORDER — ALPRAZOLAM 0.25 MG PO TABS
0.1250 mg | ORAL_TABLET | Freq: Every day | ORAL | Status: DC | PRN
  Administered 2021-11-21: 0.25 mg via ORAL

## 2021-11-21 MED ORDER — FERROUS SULFATE 325 (65 FE) MG PO TABS
325.0000 mg | ORAL_TABLET | Freq: Every day | ORAL | Status: DC
  Administered 2021-11-22 – 2021-11-23 (×2): 325 mg via ORAL
  Filled 2021-11-21 (×2): qty 1

## 2021-11-21 MED ORDER — ONDANSETRON HCL 4 MG/2ML IJ SOLN
INTRAMUSCULAR | Status: AC
  Filled 2021-11-21: qty 2

## 2021-11-21 MED ORDER — FENTANYL CITRATE (PF) 100 MCG/2ML IJ SOLN
INTRAMUSCULAR | Status: AC
Start: 2021-11-21 — End: ?
  Filled 2021-11-21: qty 2

## 2021-11-21 MED ORDER — CEFAZOLIN SODIUM-DEXTROSE 2-4 GM/100ML-% IV SOLN
2.0000 g | Freq: Four times a day (QID) | INTRAVENOUS | Status: AC
  Administered 2021-11-21 (×2): 2 g via INTRAVENOUS
  Filled 2021-11-21 (×2): qty 100

## 2021-11-21 MED ORDER — CEFAZOLIN SODIUM-DEXTROSE 2-4 GM/100ML-% IV SOLN
2.0000 g | INTRAVENOUS | Status: AC
  Administered 2021-11-21: 2 g via INTRAVENOUS

## 2021-11-21 MED ORDER — METOCLOPRAMIDE HCL 5 MG/ML IJ SOLN: 5.0000 mg | Freq: Three times a day (TID) | INTRAMUSCULAR | Status: DC | PRN

## 2021-11-21 MED ORDER — ACETAMINOPHEN 325 MG PO TABS: 325.0000 mg | ORAL_TABLET | Freq: Four times a day (QID) | ORAL | Status: DC | PRN

## 2021-11-21 MED ORDER — ONDANSETRON HCL 4 MG/2ML IJ SOLN: 4.0000 mg | Freq: Once | INTRAMUSCULAR | Status: DC | PRN

## 2021-11-21 MED ORDER — ONDANSETRON HCL 4 MG/2ML IJ SOLN
INTRAMUSCULAR | Status: DC | PRN
  Administered 2021-11-21: 4 mg via INTRAVENOUS

## 2021-11-21 MED ORDER — 0.9 % SODIUM CHLORIDE (POUR BTL) OPTIME
TOPICAL | Status: DC | PRN
  Administered 2021-11-21: 1000 mL

## 2021-11-21 MED ORDER — DOCUSATE SODIUM 100 MG PO CAPS
100.0000 mg | ORAL_CAPSULE | Freq: Two times a day (BID) | ORAL | Status: DC
  Administered 2021-11-21 – 2021-11-22 (×3): 100 mg via ORAL
  Filled 2021-11-21 (×4): qty 1

## 2021-11-21 MED ORDER — CHLORHEXIDINE GLUCONATE 0.12 % MT SOLN: 15.0000 mL | Freq: Once | OROMUCOSAL | Status: AC

## 2021-11-21 MED ORDER — ASPIRIN 81 MG PO CHEW
81.0000 mg | CHEWABLE_TABLET | Freq: Two times a day (BID) | ORAL | Status: DC
  Administered 2021-11-21 – 2021-11-22 (×3): 81 mg via ORAL
  Filled 2021-11-21 (×4): qty 1

## 2021-11-21 MED ORDER — TRANEXAMIC ACID-NACL 1000-0.7 MG/100ML-% IV SOLN
INTRAVENOUS | Status: AC
  Filled 2021-11-21: qty 100

## 2021-11-21 MED ORDER — PROPOFOL 500 MG/50ML IV EMUL
INTRAVENOUS | Status: DC | PRN
Start: 2021-11-21 — End: 2021-11-21
  Administered 2021-11-21: 50 ug/kg/min via INTRAVENOUS

## 2021-11-21 MED ORDER — OXYCODONE HCL 5 MG PO TABS: 10.0000 mg | ORAL_TABLET | ORAL | Status: DC | PRN

## 2021-11-21 MED ORDER — BUPIVACAINE-EPINEPHRINE (PF) 0.25% -1:200000 IJ SOLN
INTRAMUSCULAR | Status: DC | PRN
  Administered 2021-11-21: 30 mL

## 2021-11-21 MED ORDER — HYDROMORPHONE HCL 1 MG/ML IJ SOLN: 0.5000 mg | INTRAMUSCULAR | Status: DC | PRN

## 2021-11-21 MED ORDER — TRANEXAMIC ACID-NACL 1000-0.7 MG/100ML-% IV SOLN
1000.0000 mg | INTRAVENOUS | Status: AC
  Administered 2021-11-21: 1000 mg via INTRAVENOUS

## 2021-11-21 MED ORDER — SODIUM CHLORIDE 0.9 % IR SOLN
Status: DC | PRN
  Administered 2021-11-21: 3000 mL

## 2021-11-21 MED ORDER — ROCURONIUM BROMIDE 100 MG/10ML IV SOLN
INTRAVENOUS | Status: DC | PRN
  Administered 2021-11-21: 50 mg via INTRAVENOUS
  Administered 2021-11-21: 30 mg via INTRAVENOUS
  Administered 2021-11-21: 20 mg via INTRAVENOUS

## 2021-11-21 MED ORDER — FAMOTIDINE 20 MG PO TABS: 20.0000 mg | ORAL_TABLET | Freq: Every day | ORAL | Status: DC | PRN

## 2021-11-21 MED ORDER — SUGAMMADEX SODIUM 200 MG/2ML IV SOLN
INTRAVENOUS | Status: DC | PRN
  Administered 2021-11-21: 200 mg via INTRAVENOUS

## 2021-11-21 MED ORDER — ONDANSETRON HCL 4 MG PO TABS
4.0000 mg | ORAL_TABLET | Freq: Four times a day (QID) | ORAL | Status: DC | PRN
  Administered 2021-11-22: 4 mg via ORAL
  Filled 2021-11-21: qty 1

## 2021-11-21 MED ORDER — MIDAZOLAM HCL 5 MG/5ML IJ SOLN
INTRAMUSCULAR | Status: DC | PRN
  Administered 2021-11-21: 1 mg via INTRAVENOUS

## 2021-11-21 MED ORDER — CHLORHEXIDINE GLUCONATE 0.12 % MT SOLN
OROMUCOSAL | Status: AC
  Administered 2021-11-21: 15 mL via OROMUCOSAL
  Filled 2021-11-21: qty 15

## 2021-11-21 MED ORDER — PROPOFOL 10 MG/ML IV BOLUS
INTRAVENOUS | Status: DC | PRN
  Administered 2021-11-21: 80 mg via INTRAVENOUS
  Administered 2021-11-21: 70 mg via INTRAVENOUS

## 2021-11-21 MED ORDER — ORAL CARE MOUTH RINSE
15.0000 mL | Freq: Once | OROMUCOSAL | Status: AC
Start: 2021-11-21 — End: 2021-11-21

## 2021-11-21 MED ORDER — KETOROLAC TROMETHAMINE 15 MG/ML IJ SOLN
INTRAMUSCULAR | Status: AC
  Filled 2021-11-21: qty 1

## 2021-11-21 MED ORDER — KETOROLAC TROMETHAMINE 15 MG/ML IJ SOLN
7.5000 mg | Freq: Four times a day (QID) | INTRAMUSCULAR | Status: AC
  Administered 2021-11-21 – 2021-11-22 (×4): 7.5 mg via INTRAVENOUS
  Filled 2021-11-21 (×3): qty 1

## 2021-11-21 MED ORDER — PHENYLEPHRINE HCL-NACL 20-0.9 MG/250ML-% IV SOLN
INTRAVENOUS | Status: DC | PRN
  Administered 2021-11-21: 50 ug/min via INTRAVENOUS

## 2021-11-21 MED ORDER — PROPOFOL 1000 MG/100ML IV EMUL
INTRAVENOUS | Status: AC
  Filled 2021-11-21: qty 100

## 2021-11-21 MED ORDER — HYDROMORPHONE HCL 1 MG/ML IJ SOLN
INTRAMUSCULAR | Status: AC
Start: 2021-11-21 — End: ?
  Filled 2021-11-21: qty 1

## 2021-11-21 MED ORDER — MAGNESIUM HYDROXIDE 400 MG/5ML PO SUSP: 30.0000 mL | Freq: Every day | ORAL | Status: DC | PRN

## 2021-11-21 MED ORDER — CEFAZOLIN SODIUM-DEXTROSE 2-4 GM/100ML-% IV SOLN
INTRAVENOUS | Status: AC
  Filled 2021-11-21: qty 100

## 2021-11-21 MED ORDER — LIDOCAINE HCL (CARDIAC) PF 100 MG/5ML IV SOSY
PREFILLED_SYRINGE | INTRAVENOUS | Status: DC | PRN
  Administered 2021-11-21: 80 mg via INTRAVENOUS

## 2021-11-21 MED ORDER — ALPRAZOLAM 0.5 MG PO TABS
ORAL_TABLET | ORAL | Status: AC
  Filled 2021-11-21: qty 1

## 2021-11-21 SURGICAL SUPPLY — 75 items
BAG DECANTER FOR FLEXI CONT (MISCELLANEOUS) IMPLANT
BIT DRILL JC 5IN 2.0M 127 23FL (BIT) ×2 IMPLANT
BLADE BOVIE TIP EXT 4 (BLADE) ×2 IMPLANT
BLADE SAGITTAL 25.0X1.19X90 (BLADE) ×2 IMPLANT
BNDG COHESIVE 6X5 TAN ST LF (GAUZE/BANDAGES/DRESSINGS) ×2 IMPLANT
BNDG ELASTIC 6X5.8 VLCR STR LF (GAUZE/BANDAGES/DRESSINGS) ×2 IMPLANT
CHLORAPREP W/TINT 26 (MISCELLANEOUS) ×4 IMPLANT
COVER BACK TABLE REUSABLE LG (DRAPES) ×2 IMPLANT
DRAPE 3/4 80X56 (DRAPES) ×2 IMPLANT
DRAPE INCISE IOBAN 66X60 STRL (DRAPES) ×2 IMPLANT
DRAPE U-SHAPE 47X51 STRL (DRAPES) ×2 IMPLANT
DRSG AQUACEL AG ADV 3.5X10 (GAUZE/BANDAGES/DRESSINGS) ×1 IMPLANT
DRSG AQUACEL AG ADV 3.5X14 (GAUZE/BANDAGES/DRESSINGS) ×2 IMPLANT
ELECT CAUTERY BLADE 6.4 (BLADE) ×2 IMPLANT
ELECT REM PT RETURN 9FT ADLT (ELECTROSURGICAL) ×2 IMPLANT
ELECTRODE REM PT RTRN 9FT ADLT (ELECTROSURGICAL) ×1 IMPLANT
GAUZE 4X4 16PLY ~~LOC~~+RFID DBL (SPONGE) ×2 IMPLANT
GAUZE PACK 2X3YD (PACKING) IMPLANT
GAUZE XEROFORM 1X8 LF (GAUZE/BANDAGES/DRESSINGS) ×2 IMPLANT
GAUZE XEROFORM 4X4 STRL (GAUZE/BANDAGES/DRESSINGS) ×2 IMPLANT
GLOVE SURG ENC MOIS LTX SZ8 (GLOVE) ×2 IMPLANT
GLOVE SURG ORTHO LTX SZ8.5 (GLOVE) ×2 IMPLANT
GLOVE SURG UNDER POLY LF SZ8.5 (GLOVE) ×4 IMPLANT
GOWN STRL REUS W/ TWL LRG LVL3 (GOWN DISPOSABLE) ×1 IMPLANT
GOWN STRL REUS W/ TWL XL LVL3 (GOWN DISPOSABLE) ×1 IMPLANT
GOWN STRL REUS W/TWL LRG LVL3 (GOWN DISPOSABLE) ×1
GOWN STRL REUS W/TWL XL LVL3 (GOWN DISPOSABLE) ×1
HEAD FEMORAL SZ21 -3 OXINIUM (Head) ×1 IMPLANT
HEMOVAC 400CC 10FR (MISCELLANEOUS) IMPLANT
HOLDER FOLEY CATH W/STRAP (MISCELLANEOUS) ×2 IMPLANT
HOLSTER ELECTROSUGICAL PENCIL (MISCELLANEOUS) ×2 IMPLANT
INSERT OSTEO CFIRE ACET (Orthopedic Implant) ×1 IMPLANT
IV NS 100ML SINGLE PACK (IV SOLUTION) IMPLANT
IV NS IRRIG 3000ML ARTHROMATIC (IV SOLUTION) ×2 IMPLANT
KIT TURNOVER KIT A (KITS) ×2 IMPLANT
MANIFOLD NEPTUNE II (INSTRUMENTS) ×2 IMPLANT
MAT ABSORB  FLUID 56X50 GRAY (MISCELLANEOUS) ×1
MAT ABSORB FLUID 56X50 GRAY (MISCELLANEOUS) ×1 IMPLANT
NDL SAFETY ECLIPSE 18X1.5 (NEEDLE) ×1 IMPLANT
NEEDLE HYPO 18GX1.5 SHARP (NEEDLE) ×1
NEEDLE HYPO 22GX1.5 SAFETY (NEEDLE) ×2 IMPLANT
NS IRRIG 1000ML POUR BTL (IV SOLUTION) ×2 IMPLANT
NS IRRIG 500ML POUR BTL (IV SOLUTION) IMPLANT
OSTEOTOME THIN 12 3 (MISCELLANEOUS) ×1 IMPLANT
PACK HIP PROSTHESIS (MISCELLANEOUS) ×2 IMPLANT
PAD ARMBOARD 7.5X6 YLW CONV (MISCELLANEOUS) ×2 IMPLANT
PILLOW ABDUCTION FOAM SM (MISCELLANEOUS) IMPLANT
PILLOW ABDUCTION MEDIUM (MISCELLANEOUS) ×1 IMPLANT
PRESSURIZER CEMENT PROX FEM SM (MISCELLANEOUS) IMPLANT
PRESSURIZER FEM CANAL M (MISCELLANEOUS) IMPLANT
PULSAVAC PLUS IRRIG FAN TIP (DISPOSABLE) ×2 IMPLANT
RETRIEVER SUT HEWSON (MISCELLANEOUS) ×2 IMPLANT
SOLUTION PRONTOSAN WOUND 350ML (IRRIGATION / IRRIGATOR) ×2 IMPLANT
SPONGE T-LAP 18X18 ~~LOC~~+RFID (SPONGE) ×8 IMPLANT
STAPLER SKIN PROX 35W (STAPLE) ×2 IMPLANT
STEM FEM STD OFFSET SZ20 190 (Stem) ×1 IMPLANT
SUT DVC 2 QUILL PDO  T11 36X36 (SUTURE) ×1
SUT DVC 2 QUILL PDO T11 36X36 (SUTURE) IMPLANT
SUT ETHIBOND #5 BRAIDED 30INL (SUTURE) ×2 IMPLANT
SUT MERSILENE 5MM BP 1 12 (SUTURE) IMPLANT
SUT VIC AB 1 CT1 18XCR BRD 8 (SUTURE) IMPLANT
SUT VIC AB 1 CT1 8-18 (SUTURE) ×1
SUT VIC AB 2-0 CT1 18 (SUTURE) ×4 IMPLANT
SUT VIC AB 2-0 CT1 27 (SUTURE) ×1
SUT VIC AB 2-0 CT1 TAPERPNT 27 (SUTURE) ×1 IMPLANT
SYR 20ML LL LF (SYRINGE) ×2 IMPLANT
SYR TB 1ML 27GX1/2 LL (SYRINGE) IMPLANT
TIP BRUSH PULSAVAC PLUS 24.33 (MISCELLANEOUS) ×2 IMPLANT
TIP FAN IRRIG PULSAVAC PLUS (DISPOSABLE) ×1 IMPLANT
TOWER CARTRIDGE SMART MIX (DISPOSABLE) IMPLANT
TUBE KAMVAC SUCTION (TUBING) ×1 IMPLANT
TUBE SUCT KAM VAC (TUBING) ×1 IMPLANT
WAND WEREWOLF FASTSEAL 6.0 (MISCELLANEOUS) ×2 IMPLANT
WATER STERILE IRR 1000ML POUR (IV SOLUTION) ×1 IMPLANT
WATER STERILE IRR 500ML POUR (IV SOLUTION) ×1 IMPLANT

## 2021-11-21 NOTE — Progress Notes (Signed)
Patient awake/alert x4.  Moves x4 ext, abduction pillow in place, pedal pulses intact, warm/dry. Xray completed.  ?Patient sleeping at intervals, when awakened to question pain score states 11/10. Medicated as prescribed, also given Xanax 0.25 po per Dr. Harlow Mares orders, patient states he takes xanax four times a day.  Vitals stable, will continue to monitor closely, also offer encouragement and reinforce plan of care/pain control.  ?

## 2021-11-21 NOTE — H&P (Signed)
The patient has been re-examined, and the chart reviewed, and there have been no interval changes to the documented history and physical.  Plan a right total hip revision today. ? ?Anesthesia is not consulted regarding a peripheral nerve block for post-operative pain. ? ?The risks, benefits, and alternatives have been discussed at length, and the patient is willing to proceed.   ? ?

## 2021-11-21 NOTE — Progress Notes (Signed)
Bladder scan: U/A retention 20 mL. Notified incoming nurse via shift report.  ?

## 2021-11-21 NOTE — Evaluation (Signed)
Physical Therapy Evaluation ?Patient Details ?Name: [name] ?MRN: [id number] ?DOB: Oct 25, 1955 ?Today's Date: 11/21/2021 ? ?History of Present Illness ? 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  PMH includes COPD, CAD, and MI. 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  PMH includes COPD, CAD, and MI. ?  ?Clinical Impression ? Pt somewhat lethargic during the session with level of alertness waxing and waning during the session.  Pt A&O x 4 and able to follow commands but required occasional extra time and cuing to do so.  Pt required physical assistance with all functional tasks but likely somewhat related to lethargy.  Once in standing the pt was able to take several steps at the EOB with min A for stability.  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  Pt will benefit from HHPT upon discharge to safely address deficits listed in patient problem list for decreased caregiver assistance and eventual return to PLOF. ? ?   ? ?Recommendations for follow up therapy are one component of a multi-disciplinary discharge planning process, led by the attending physician.  Recommendations may be updated based on patient status, additional functional criteria and insurance authorization. ? ?Follow Up Recommendations Home health PT ? ?  ?Assistance Recommended at Discharge Frequent or constant Supervision/Assistance  ?Patient can return home with the following ? A little help with walking and/or transfers;A little help with bathing/dressing/bathroom;Assistance with cooking/housework;Direct supervision/assist for medications management;Assist for transportation;Help with stairs or ramp for entrance ? ?  ?Equipment Recommendations  None recommended by PT  ?Recommendations for Other Services ?    ?  ?Functional Status Assessment Patient has had a recent decline in their functional status and demonstrates the ability to make significant improvements in function in a reasonable and predictable amount of time.  ? ?  ?Precautions / Restrictions Precautions ?Precautions: Fall;Posterior Hip ?Precaution Booklet Issued: Yes (comment) ?Restrictions ?Weight Bearing Restrictions: Yes ?RLE Weight Bearing: Weight bearing as tolerated  ? ?  ? ?Mobility ? Bed Mobility ?Overal bed mobility: Needs Assistance ?Bed Mobility: Supine to Sit, Sit to Supine ?  ?  ?Supine to sit: Mod assist ?Sit to supine: Mod assist ?  ?General bed mobility comments: Mod A for BLE and trunk control ?  ? ?Transfers ?Overall transfer level: Needs assistance ?Equipment used: Rolling walker (2 wheels) ?Transfers: Sit to/from Stand ?Sit to Stand: From elevated surface, Min assist ?  ?  ?  ?  ?  ?General transfer comment: Mod verbal cues for sequencing ?  ? ?Ambulation/Gait ?Ambulation/Gait assistance: Min assist ?Gait Distance (Feet): 2 Feet ?Assistive device: Rolling walker (2 wheels) ?Gait Pattern/deviations: Step-to pattern, Trunk flexed, Decreased stance time - right, Decreased step length - left ?Gait velocity: decreased ?  ?  ?General Gait Details: Pt able to take several effortful steps at the EOB with min A for stability ? ?Stairs ?  ?  ?  ?  ?  ? ?Wheelchair Mobility ?  ? ?Modified Rankin (Stroke Patients Only) ?  ? ?  ? ?Balance Overall balance assessment: Needs assistance ?  ?Sitting balance-Leahy Scale: Good ?  ?  ?Standing balance support: Bilateral upper extremity supported, During functional activity ?Standing balance-Leahy Scale: Fair ?  ?  ?  ?  ?  ?  ?  ?  ?  ?  ?  ?  ?   ? ? ? ?  Pertinent Vitals/Pain Pain Assessment ?Pain Assessment: 0-10 ?Pain Score: 8  ?Pain Location: R hip ?Pain Descriptors / Indicators: Sore ?Pain Intervention(s): Repositioned, Premedicated before  session, Ice applied, Monitored during session  ? ? ?Home Living Family/patient expects to be discharged to:: Private residence ?Living Arrangements: Alone ?Available Help at Discharge: Family;Available 24 hours/day (sister will stay with pt 24/7 at discharge) ?Type of Home: House ?Home Access: Stairs to enter ?Entrance Stairs-Rails: Right;Left (too wide for both) ?Entrance Stairs-Number of Steps: 4 ?  ?Home Layout: One level ?Home Equipment: Rolling Walker (2 wheels);BSC/3in1;Cane - single point ?   ?  ?Prior Function Prior Level of Function : Independent/Modified Independent ?  ?  ?  ?  ?  ?  ?Mobility Comments: Mod Ind amb with a SPC community distances, no fall history ?ADLs Comments: Ind with ADLs ?  ? ? ?Hand Dominance  ? Dominant Hand: Right ? ?  ?Extremity/Trunk Assessment  ? Upper Extremity Assessment ?Upper Extremity Assessment: Overall WFL for tasks assessed ?  ? ?Lower Extremity Assessment ?Lower Extremity Assessment: RLE deficits/detail ?RLE Deficits / Details: BLE ankle AROM, strength, and sensation to light touch grossly intact ?RLE Sensation: WNL ?  ? ?   ?Communication  ? Communication: No difficulties  ?Cognition Arousal/Alertness: Lethargic ?Behavior During Therapy: [facility] for tasks assessed/performed ?Overall Cognitive Status: Within Functional Limits for tasks assessed ?  ?  ?  ?  ?  ?  ?  ?  ?  ?  ?  ?  ?  ?  ?  ?  ?  ?  ?  ? ?  ?General Comments   ? ?  ?Exercises Other Exercises ?Other Exercises: Posterior hip precaution education ?Other Exercises: HEP education per handout  ? ?Assessment/Plan  ?  ?PT Assessment Patient needs continued PT services  ?PT Problem List Decreased strength;Decreased activity tolerance;Decreased balance;Decreased mobility;Decreased knowledge of use of DME;Decreased knowledge of precautions;Pain ? ?   ?  ?PT Treatment Interventions DME instruction;Gait training;Stair training;Functional mobility training;Therapeutic activities;Therapeutic exercise;Balance  training;Patient/family education   ? ?PT Goals (Current goals can be found in the Care Plan section)  ?Acute Rehab PT Goals ?Patient Stated Goal: To be able to work in the yard ?PT Goal Formulation: With patient ?Time For Goal Achievement: 12/04/21 ?Potential to Achieve Goals: Good ? ?  ?Frequency BID ?  ? ? ?Co-evaluation   ?  ?  ?  ?  ? ? ?  ?AM-PAC PT "6 Clicks" Mobility  ?Outcome Measure Help needed turning from your back to your side while in a flat bed without using bedrails?: A Lot ?Help needed moving from lying on your back to sitting on the side of a flat bed without using bedrails?: A Lot ?Help needed moving to and from a bed to a chair (including a wheelchair)?: A Lot ?Help needed standing up from a chair using your arms (e.g., wheelchair or bedside chair)?: A Little ?Help needed to walk in hospital room?: Total ?Help needed climbing 3-5 steps with a railing? : Total ?6 Click Score: 11 ? ?  ?End of Session Equipment Utilized During Treatment: Gait belt ?Activity Tolerance: Patient tolerated treatment well ?Patient left: in bed;with call bell/phone within reach;with bed alarm set;with SCD's reapplied ?Nurse Communication: Mobility status;Weight bearing status;Precautions ?PT Visit Diagnosis: Other abnormalities of gait and mobility (R26.89);Muscle weakness (generalized) (M62.81);Pain ?Pain - Right/Left: Right ?Pain - part of body: Hip ?  ? ?Time: 7672-0947 ?PT Time Calculation (min) (ACUTE ONLY): 32 min ? ? ?Charges:   PT  Evaluation ?$PT Eval Moderate Complexity: 1 Mod ?PT Treatments ?$Therapeutic Activity: 8-22 mins ?  ?   ? ? ?D. Royetta Asal PT, DPT ?11/21/21, 5:25 PM ? ? ?

## 2021-11-21 NOTE — Op Note (Signed)
11/21/2021 ? ?1:54 PM ? ?PATIENT:  Nayquan Evinger Mclaurin  ? ?MRN: [id number] ? 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 ?POST-OPERATIVE DIAGNOSIS: Same ? ?Procedure: Revision Right Total Hip Replacement, femoral and acetabular components ? ?Surgeon: Elyn Aquas. Harlow Mares, MD  ? ?Assist: [name], PA-C ? ?Anesthesia: Spinal  ? ?EBL: 500 mL  ? ?Specimens: None  ? ?Drains: None  ? ?Components used: A size 190 mm Redapt stem [employer], [employer]  Omnifit 10 degree series 2 insert 32 mm ID, and a 32 mm by -3 mm head  ? ? ?Description of the procedure in detail: After informed consent was obtained and the appropriate extremity marked in the pre-operative holding area, the patient was taken to the operating room and placed in the lateral position on the operative table. All pressure points were well padded and an axillary roll placed. The hip was prepped and draped in standard sterile fashion. A general anesthetic had been delivered by the anesthesia team. The skin and subcutaneous tissues were injected with a mixture of Marcaine with epinephrine for post-operative pain. The prior incision was utilized centered over the greater trochanter. The tensor fascia was divided and blunt dissection was taken down to the level of the joint capsule. The Charnley retractor was placed. A standard posterior approach was utilized and the capsule tagged for later closure. The hip was dislocated and the head was passed from the field with use of a bone tamp. ? ?The femoral stem was grossly loose. Osteophytes were removed and the stem extractor placed and the stem removed with minimal bone loss. There was extensive proximal osteolysis. Diseased tissue was debrided. The stem tip was left in place. The canal was reamed to a size 20 mm.  Attention was then turned to the acetabulum. The shell was found to be stable. The liner was removed with osteotomes and passed from the field. The shell was irrigated  and the screws were made tight. A trial liner was placed and a trial femur. The hip was reduced and found to be stable. The trials were removed and the above final components were impacted in to position. The hip was again stable. The wound was irrigated. The posterior capsule closed through drill holes in the greater trochanter and non-absorbable suture material. ? ?The wound was irrigated and the tensor fascia closed with #1 Vicryl and #2 Quill suture. The subcutaneous tissues were closed with 2-0 vicryl and staples for the skin. A sterile dressing was applied and an abduction pillow. Patient tolerated the procedure well and there were no apparent complication. Patient was taken to the recovery room in good condition.  ? ?Kurtis Bushman, MD ? ? ?
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
## 2021-11-21 NOTE — Transfer of Care (Signed)
Immediate Anesthesia Transfer of Care Note ? ?Patient: [name] ? ?Procedure(s) Performed: TOTAL HIP REVISION (Right: Hip) ? ?Patient Location: PACU ? ?Anesthesia Type:General ? ?Level of Consciousness: awake, alert  and oriented ? ?Airway & Oxygen Therapy: Patient Spontanous Breathing and Patient connected to face mask oxygen ? ?Post-op Assessment: Report given to RN and Post -op Vital signs reviewed and stable ? ?Post vital signs: stable ? ?Last Vitals:  ?Vitals Value Taken Time  ?BP 143/64 11/21/21 1400  ?Temp    ?Pulse 103 11/21/21 1404  ?Resp 14 11/21/21 1404  ?SpO2 99 % 11/21/21 1404  ?Vitals shown include unvalidated device data. ? ?Last Pain:  ?Vitals:  ? 11/21/21 0843  ?TempSrc: Temporal  ?PainSc: 6   ?   ? ?  ? ?Complications: No notable events documented. ?

## 2021-11-21 NOTE — Progress Notes (Signed)
?   11/21/21 1611  ?Assess: MEWS Score  ?Temp 97.7 ?F (36.5 ?C)  ?BP 133/71  ?Pulse Rate (!) 115  ?Resp 15  ?SpO2 100 %  ?O2 Device Room Air  ?Assess: MEWS Score  ?MEWS Temp 0  ?MEWS Systolic 0  ?MEWS Pulse 2  ?MEWS RR 0  ?MEWS LOC 0  ?MEWS Score 2  ?MEWS Score Color Yellow  ?Assess: if the MEWS score is Yellow or Red  ?Were vital signs taken at a resting state? Yes  ?Focused Assessment Change from prior assessment (see assessment flowsheet)  ?Does the patient meet 2 or more of the SIRS criteria? No  ?MEWS guidelines implemented *See Row Information* Yes  ?Treat  ?Pain Scale PAINAD  ?Pain Score Asleep  ?Take Vital Signs  ?Increase Vital Sign Frequency  Yellow: Q 2hr X 2 then Q 4hr X 2, if remains yellow, continue Q 4hrs  ?Escalate  ?MEWS: Escalate Yellow: discuss with charge nurse/RN and consider discussing with provider and RRT  ?Notify: Charge Nurse/RN  ?Name of Charge Nurse/RN Notified Sherryl Manges RN  ?Date Charge Nurse/RN Notified 11/21/21  ?Time Charge Nurse/RN Notified 1520  ?Notify: Provider  ?Provider Name/Title Dr. Ace Gins PA ?Hydrographic surveyor notified MD)  ?Date Provider Notified 11/21/21  ?Time Provider Notified 1520  ?Notification Type Page  ?Notification Reason Other (Comment) ?(Pt with elevating HR and elevating BP)  ?Provider response No new orders;Other (Comment) ?(Pending response from MD)  ?Date of Provider Response 11/21/21  ?Time of Provider Response  ?(Pending response)  ?Document  ?Progress note created (see row info) Yes  ?Assess: SIRS CRITERIA  ?SIRS Temperature  0  ?SIRS Pulse 1  ?SIRS Respirations  0  ?SIRS WBC 0  ?SIRS Score Sum  1  ? ? ?

## 2021-11-22 ENCOUNTER — Encounter: Payer: Self-pay | Admitting: Orthopedic Surgery

## 2021-11-22 ENCOUNTER — Inpatient Hospital Stay: Payer: Medicare Other

## 2021-11-22 DIAGNOSIS — R7989 Other specified abnormal findings of blood chemistry: Secondary | ICD-10-CM | POA: Diagnosis present

## 2021-11-22 DIAGNOSIS — I479 Paroxysmal tachycardia, unspecified: Secondary | ICD-10-CM | POA: Diagnosis present

## 2021-11-22 DIAGNOSIS — R000 Tachycardia, unspecified: Secondary | ICD-10-CM

## 2021-11-22 DIAGNOSIS — Z96641 Presence of right artificial hip joint: Secondary | ICD-10-CM | POA: Diagnosis not present

## 2021-11-22 DIAGNOSIS — R778 Other specified abnormalities of plasma proteins: Secondary | ICD-10-CM | POA: Diagnosis present

## 2021-11-22 LAB — CBC WITH DIFFERENTIAL/PLATELET
Abs Immature Granulocytes: 0.05 10*3/uL (ref 0.00–0.07)
Basophils Absolute: 0 10*3/uL (ref 0.0–0.1)
Basophils Relative: 0 %
Eosinophils Absolute: 0.2 10*3/uL (ref 0.0–0.5)
Eosinophils Relative: 2 %
HCT: 25.2 % — ABNORMAL LOW (ref 39.0–52.0)
Hemoglobin: 7.8 g/dL — ABNORMAL LOW (ref 13.0–17.0)
Immature Granulocytes: 0 %
Lymphocytes Relative: 8 %
Lymphs Abs: 1 10*3/uL (ref 0.7–4.0)
MCH: 29.9 pg (ref 26.0–34.0)
MCHC: 31 g/dL (ref 30.0–36.0)
MCV: 96.6 fL (ref 80.0–100.0)
Monocytes Absolute: 1 10*3/uL (ref 0.1–1.0)
Monocytes Relative: 8 %
Neutro Abs: 10.3 10*3/uL — ABNORMAL HIGH (ref 1.7–7.7)
Neutrophils Relative %: 82 %
Platelets: 80 10*3/uL — ABNORMAL LOW (ref 150–400)
RBC: 2.61 MIL/uL — ABNORMAL LOW (ref 4.22–5.81)
RDW: 20.5 % — ABNORMAL HIGH (ref 11.5–15.5)
WBC: 12.6 10*3/uL — ABNORMAL HIGH (ref 4.0–10.5)
nRBC: 0 % (ref 0.0–0.2)

## 2021-11-22 LAB — BASIC METABOLIC PANEL
Anion gap: 7 (ref 5–15)
BUN: 30 mg/dL — ABNORMAL HIGH (ref 8–23)
CO2: 23 mmol/L (ref 22–32)
Calcium: 8.6 mg/dL — ABNORMAL LOW (ref 8.9–10.3)
Chloride: 110 mmol/L (ref 98–111)
Creatinine, Ser: 2.37 mg/dL — ABNORMAL HIGH (ref 0.61–1.24)
GFR, Estimated: 29 mL/min — ABNORMAL LOW (ref >60)
Glucose, Bld: 110 mg/dL — ABNORMAL HIGH (ref 70–99)
Potassium: 5.1 mmol/L (ref 3.5–5.1)
Sodium: 140 mmol/L (ref 135–145)

## 2021-11-22 LAB — CBC
HCT: 27.3 % — ABNORMAL LOW (ref 39.0–52.0)
Hemoglobin: 8.4 g/dL — ABNORMAL LOW (ref 13.0–17.0)
MCH: 30 pg (ref 26.0–34.0)
MCHC: 30.8 g/dL (ref 30.0–36.0)
MCV: 97.5 fL (ref 80.0–100.0)
Platelets: 73 10*3/uL — ABNORMAL LOW (ref 150–400)
RBC: 2.8 MIL/uL — ABNORMAL LOW (ref 4.22–5.81)
RDW: 20.8 % — ABNORMAL HIGH (ref 11.5–15.5)
WBC: 10.8 10*3/uL — ABNORMAL HIGH (ref 4.0–10.5)
nRBC: 0 % (ref 0.0–0.2)

## 2021-11-22 LAB — BPAM RBC
Blood Product Expiration Date: 202304112359
ISSUE DATE / TIME: 202304101151
Unit Type and Rh: 5100

## 2021-11-22 LAB — D-DIMER, QUANTITATIVE: D-Dimer, Quant: 1.71 ug/mL-FEU — ABNORMAL HIGH (ref 0.00–0.50)

## 2021-11-22 LAB — TYPE AND SCREEN
ABO/RH(D): AB POS
Antibody Screen: NEGATIVE
Unit division: 0

## 2021-11-22 LAB — CREATININE, SERUM
Creatinine, Ser: 3.17 mg/dL — ABNORMAL HIGH (ref 0.61–1.24)
GFR, Estimated: 21 mL/min — ABNORMAL LOW (ref >60)

## 2021-11-22 LAB — LACTIC ACID, PLASMA
Lactic Acid, Venous: 1.8 mmol/L (ref 0.5–1.9)
Lactic Acid, Venous: 1.3 mmol/L (ref 0.5–1.9)

## 2021-11-22 LAB — PROCALCITONIN: Procalcitonin: 0.91 ng/mL

## 2021-11-22 LAB — TROPONIN I (HIGH SENSITIVITY)
Troponin I (High Sensitivity): 34 ng/L — ABNORMAL HIGH (ref <18)
Troponin I (High Sensitivity): 45 ng/L — ABNORMAL HIGH (ref <18)

## 2021-11-22 MED ORDER — HYDRALAZINE HCL 20 MG/ML IJ SOLN: 5.0000 mg | Freq: Four times a day (QID) | INTRAMUSCULAR | Status: DC | PRN

## 2021-11-22 MED ORDER — METOPROLOL TARTRATE 5 MG/5ML IV SOLN
5.0000 mg | INTRAVENOUS | Status: DC | PRN
  Administered 2021-11-22 – 2021-11-23 (×2): 5 mg via INTRAVENOUS
  Filled 2021-11-22 (×3): qty 5

## 2021-11-22 MED ORDER — ENSURE ENLIVE PO LIQD: 237.0000 mL | Freq: Two times a day (BID) | ORAL | Status: DC

## 2021-11-22 MED ORDER — SODIUM CHLORIDE 0.9 % IV SOLN
500.0000 mg | INTRAVENOUS | Status: DC
  Administered 2021-11-23: 500 mg via INTRAVENOUS
  Filled 2021-11-22: qty 500
  Filled 2021-11-22: qty 5

## 2021-11-22 MED ORDER — LACTATED RINGERS IV BOLUS
500.0000 mL | Freq: Once | INTRAVENOUS | Status: AC
Start: 2021-11-22 — End: 2021-11-22
  Administered 2021-11-22: 500 mL via INTRAVENOUS

## 2021-11-22 MED ORDER — ETHACRYNATE SODIUM 50 MG IV SOLR: 50.0000 mg | Freq: Once | INTRAVENOUS | Status: DC

## 2021-11-22 MED ORDER — SODIUM CHLORIDE 0.9 % IV BOLUS (SEPSIS): 1000.0000 mL | Freq: Once | INTRAVENOUS | Status: DC

## 2021-11-22 MED ORDER — SODIUM CHLORIDE 0.9 % IV SOLN
2.0000 g | INTRAVENOUS | Status: DC
  Administered 2021-11-22: 2 g via INTRAVENOUS
  Filled 2021-11-22: qty 20
  Filled 2021-11-22: qty 2

## 2021-11-22 MED ORDER — ETHACRYNATE SODIUM 50 MG IV SOLR
50.0000 mg | Freq: Once | INTRAVENOUS | Status: AC
  Administered 2021-11-23: 50 mg via INTRAVENOUS
  Filled 2021-11-22: qty 50

## 2021-11-22 MED ORDER — ADULT MULTIVITAMIN W/MINERALS CH
1.0000 | ORAL_TABLET | Freq: Every day | ORAL | Status: DC
  Administered 2021-11-22: 1 via ORAL
  Filled 2021-11-22 (×2): qty 1

## 2021-11-22 MED ORDER — BUMETANIDE 0.25 MG/ML IJ SOLN: 0.5000 mg | Freq: Once | INTRAMUSCULAR | Status: DC

## 2021-11-22 NOTE — Progress Notes (Signed)
Met with the patient and his spouse in the room ?He has DME at home with a rolling walker and a 3 in 1 ?He is agreeable to have Home health ?He refuses SNF ?[facility] is open with the patient prior to surgery by Surgeons office ?He has transportation with his wife ?

## 2021-11-22 NOTE — Assessment & Plan Note (Addendum)
-   We will continue to monitor second troponin ?- Low clinical suspicion for ACS as patient is not having any chest pain ?- If elevated heart rate persist, would recommend a.m. team consider echocardiogram ?

## 2021-11-22 NOTE — Hospital Course (Addendum)
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 ? ?Patient is status post revision of right total hip replacement, femoral and acetabular components on 11/21/2021. ? ?Vitals on day of consult showed temperature of 98.7, respiration rate of 16, heart rate of 125, blood pressure initially 116/52 and increased to 153/66, SPO2 of 97% on room air. ? ?Serum sodium 140, potassium 5.1, chloride 110, bicarb 23, nonfasting blood glucose 110, BUN of 30, serum creatinine of 2.37, GFR of 29, WBC 10.8, hemoglobin 8.4, and platelets of 73. ? ?Patient's home medications were resumed.  Patient's pain control was through ketorolac 7.5 mg every 6 hours, and patient was given cefazolin 2 g every 6 hours.   ? ?On day of operation, patient did receive LR infusion at 75 mL/h, which was discontinued on day of operation as well, per [facility] patient received approximately 6 to 8 hours of this IVF. ? ?Medicine/hospitalist service was consulted for tachycardia. ? ?Orthopedic provider ordered LR 500 L bolus. ?

## 2021-11-22 NOTE — Evaluation (Signed)
Occupational Therapy Evaluation ?Patient Details ?Name: [name] ?MRN: [id number] ?DOB: [birth year] ?Today's Date: 11/22/2021 ? ? 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  PMH includes COPD, CAD, and MI. 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  PMH includes COPD, CAD, and MI.  ? ?Clinical Impression ?  ?Pt is a 66 y/o male s/p revision of posterior R THA.  OT reviewed hip precautions; pt unable to recall any, but once reviewed, pt reported he was familiar with 2/3 precautions.  OT made recommendation for hip kit with OT providing explanation of individual devices and their uses.  OT helped sister navigate options for obtaining kit.  Pt initially agreeable for OT demonstration of reacher and sockaid use.  OT retrieved items for demo and pt adamantly declined instruction, declined sitting EOB or even bed mobility.  OT provided extensive encouragement for mobility attempts, which was ineffective.  Pt did demonstrate some confusion today, but did not respond well to encouragement.  Pt was left in room with sister present, bed alarm set, and all necessary items within reach.  Pt lives alone but reports multiple family members are available to provide assist as needed at home.  Recommending SNF at discharge, but may upgrade to [facility] if pt can demonstrate more ADL participation and functional mobility.   ?   ? ?Recommendations for follow up therapy are one component of a multi-disciplinary discharge planning process, led by the attending physician.  Recommendations may be updated based on patient status, additional functional criteria and insurance authorization.  ? ?Follow Up Recommendations ? Skilled nursing-short term rehab (<3 hours/day)  ?  ?Assistance Recommended at Discharge Frequent or  constant Supervision/Assistance  ?Patient can return home with the following A little help with walking and/or transfers;A lot of help with bathing/dressing/bathroom;Direct supervision/assist for medications management;Help with stairs or ramp for entrance;Assistance with cooking/housework ? ?  ?Functional Status Assessment ? Patient has had a recent decline in their functional status and demonstrates the ability to make significant improvements in function in a reasonable and predictable amount of time.  ?Equipment Recommendations ? None recommended by OT  ?  ?Recommendations for Other Services   ? ? ?  ?Precautions / Restrictions Precautions ?Precautions: Fall;Posterior Hip ?Precaution Booklet Issued: Yes (comment) ?Restrictions ?Weight Bearing Restrictions: Yes ?RLE Weight Bearing: Weight bearing as tolerated  ? ?  ? ?Mobility Bed Mobility ?  ?  ?  ?  ?  ?  ?  ?General bed mobility comments: NT; pt adamantly refused any bed mobility despite encouragement from therapist ?Patient Response:  (poor cooperation with therapeutic activities) ? ?Transfers ?  ?  ?  ?  ?  ?  ?  ?  ?  ?General transfer comment: Transfers NT; pt adamantly refused OOB activities despite encouragement from therapist ?  ? ?  ?Balance   ?  ?  ?Sitting balance - Comments: NT; pt admantly declined sitting EOB ?  ?  ?  ?Standing balance comment: NT; pt adamantly declined OOB activity ?  ?  ?  ?  ?  ?  ?  ?  ?  ?  ?  ?   ? ?ADL either performed or assessed with clinical judgement  ? ?ADL Overall ADL's : Needs assistance/impaired ?  ?  ?  ?  ?  ?  ?  ?  ?  ?  ?  Lower Body Dressing: Maximal assistance ?Lower Body Dressing Details (indicate cue type and reason): needs instruction in AE d/t hip precautions (pt reports he has not used equipment before) ?  ?  ?  ?  ?  ?  ?  ?   ? ? ? ?Vision Patient Visual Report: No change from baseline ?   ?   ?   ?  ?   ?  ? ?Pertinent Vitals/Pain Pain Assessment ?Pain Assessment: 0-10 ?Pain Score: 7  ?Pain Location: R  hip ?Pain Descriptors / Indicators: Sore ?Pain Intervention(s): Monitored during session  ? ? ? ?Hand Dominance Right ?  ?Extremity/Trunk Assessment Upper Extremity Assessment ?Upper Extremity Assessment: Overall WFL for tasks assessed ?  ?Lower Extremity Assessment ?Lower Extremity Assessment: Defer to PT evaluation ?  ?  ?  ?Communication Communication ?Communication: No difficulties ?  ?Cognition Arousal/Alertness: Awake/alert ?Behavior During Therapy:  (slightly confused, non-adherent to encouragement for OOB activities) ?Overall Cognitive Status: Impaired/Different from baseline ?Area of Impairment: Awareness, Safety/judgement, Memory ?  ?  ?  ?  ?  ?  ?  ?  ?  ?  ?Memory: Decreased recall of precautions ?  ?Safety/Judgement: Decreased awareness of safety, Decreased awareness of deficits ?  ?  ?General Comments: A&O to self, location, date. Sister present and able to help correct or confirm questions related to AE and home environment. ?  ?  ?General Comments    ? ?  ?Exercises Other Exercises ?Other Exercises: Review of hip precautions with review of need to avoid low seated surfaces.  Pt reports he sometimes sleeps on couch, but sister confirms couch is low.  OT recommended sleeping in bed to maintain hip precautions.  Pt verbalized undrestanding. ?  ?    ? ? ?Home Living Family/patient expects to be discharged to:: Private residence ?Living Arrangements: Alone ?Available Help at Discharge: Family;Available 24 hours/day ?Type of Home: House ?Home Access: Stairs to enter ?Entrance Stairs-Number of Steps: 4 ?Entrance Stairs-Rails: Right;Left ?Home Layout: One level ?  ?  ?Bathroom Shower/Tub: Gaffer;Tub/shower unit ?  ?Bathroom Toilet: Handicapped height ?Bathroom Accessibility: Yes ?  ?Home Equipment: Rolling Walker (2 wheels);BSC/3in1;Cane - single point;Shower seat ?  ?  ?  ? ?  ?Prior Functioning/Environment Prior Level of Function : Independent/Modified Independent ?  ?  ?  ?  ?  ?  ?Mobility  Comments: Mod Ind amb with a SPC community distances, no fall history ?ADLs Comments: Ind with ADLs ?  ? ?  ?  ?OT Problem List: Decreased strength;Decreased activity tolerance;Decreased knowledge of use of DME or AE;Impaired balance (sitting and/or standing);Decreased safety awareness;Decreased knowledge of precautions;Pain ?  ?   ?OT Treatment/Interventions: Self-care/ADL training;Balance training;Therapeutic exercise;DME and/or AE instruction;Therapeutic activities;Patient/family education  ?  ?OT Goals(Current goals can be found in the care plan section) Acute Rehab OT Goals ?Patient Stated Goal: To go home ?OT Goal Formulation: With patient/family ?Time For Goal Achievement: 12/05/21 ?Potential to Achieve Goals: Fair  ?OT Frequency: Min 2X/week ?  ? ?   ?  ?  ?  ?  ? ?  ?AM-PAC OT "6 Clicks" Daily Activity     ?Outcome Measure Help from another person eating meals?: None ?Help from another person taking care of personal grooming?: None ?Help from another person toileting, which includes using toliet, bedpan, or urinal?: A Lot ?Help from another person bathing (including washing, rinsing, drying)?: A Lot ?Help from another person to put on and taking off regular upper body clothing?: None ?Help  from another person to put on and taking off regular lower body clothing?: A Lot ?6 Click Score: 18 ?  ?End of Session   ? ?Activity Tolerance: Other (comment) (self limiting) ?Patient left: in bed;with call bell/phone within reach;with bed alarm set;with family/visitor present ? ?OT Visit Diagnosis: Other abnormalities of gait and mobility (R26.89);Pain;Muscle weakness (generalized) (M62.81) ?Pain - part of body: Hip  ?              ?Time: 8614-8307 ?OT Time Calculation (min): 25 min ?Charges:  OT General Charges ?$OT Visit: 1 Visit ?OT Evaluation ?$OT Eval Low Complexity: 1 Low ?OT Treatments ?$Self Care/Home Management : 8-22 mins ? ?Leta Speller, MS, OTR/L ? ? ?Darleene Cleaver ?11/22/2021, 4:12 PM ?
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
## 2021-11-22 NOTE — Progress Notes (Signed)
?   11/22/21 1110  ?Assess: MEWS Score  ?Temp 98.7 ?F (37.1 ?C)  ?BP 120/70  ?Pulse Rate (!) 117  ?Resp 15  ?SpO2 99 %  ?O2 Device Room Air  ?Assess: MEWS Score  ?MEWS Temp 0  ?MEWS Systolic 0  ?MEWS Pulse 2  ?MEWS RR 0  ?MEWS LOC 0  ?MEWS Score 2  ?MEWS Score Color Yellow  ?Assess: if the MEWS score is Yellow or Red  ?Were vital signs taken at a resting state? Yes  ?Focused Assessment No change from prior assessment  ?Does the patient meet 2 or more of the SIRS criteria? No  ?Does the patient have a confirmed or suspected source of infection? No  ?MEWS guidelines implemented *See Row Information* No, previously yellow, continue vital signs every 4 hours  ?Assess: SIRS CRITERIA  ?SIRS Temperature  0  ?SIRS Pulse 1  ?SIRS Respirations  0  ?SIRS WBC 0  ?SIRS Score Sum  1  ? ? ?

## 2021-11-22 NOTE — Assessment & Plan Note (Addendum)
-   Etiology work-up in progress, differentials include pulmonary emboli given recent hip procedure, pneumonia, heart failure reduced ejection fraction/volume overload ?- Patient denies chest pain, and EKG was negative for ischemic changes, low clinical suspicion for ACS ?- Check D-dimer, procalcitonin, high sensitive troponin, EKG ordered ?- Metoprolol tartrate injection 5 mg IV every 2 hours as needed for heart rate greater than 120, 5 doses ordered ?- Furosemide and Bumex were considered however given patient's history of sulfa drugs causing blisters, discussed with pharmacy and ethacrynic acid 50 mg IVPB one-time dose has been ordered ?- Elevated procalcitonin, initiated azithromycin and ceftriaxone ?- Repeat procalcitonin in the a.m. for 2 days, if resolved would recommend discontinuation of antibiotic ?- Strict I's and O's ?

## 2021-11-22 NOTE — Progress Notes (Signed)
Initial Nutrition Assessment ? ?DOCUMENTATION CODES:  ? ?Not applicable ? ?INTERVENTION:  ? ?-Ensure Enlive po BID, each supplement provides 350 kcal and 20 grams of protein ?-MVI with minerals daily ? ?NUTRITION DIAGNOSIS:  ? ?Increased nutrient needs related to post-op healing as evidenced by estimated needs. ? ?GOAL:  ? ?Patient will meet greater than or equal to 90% of their needs ? ?MONITOR:  ? ?PO intake, Supplement acceptance, Labs, Weight trends, Skin, I & O's ? ?REASON FOR ASSESSMENT:  ? ?Malnutrition Screening Tool ?  ? ?ASSESSMENT:  ? ?Pt is a 66 yo male diagnosed with right hip replacement mechanical loosening and osteolysis and is now s/p elective revision of his right THR.  PMH includes COPD, CAD, and MI. 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  PMH includes COPD, CAD, and MI. ? ?Pt admitted with mechanical loosening of rt hip replacement.  ? ?4/10- s/p Procedure: Revision Right Total Hip Replacement, femoral and acetabular components ? ?Reviewed I/O's: +2 L x 24 hours ? ?UOP: 250 ml x 24 hours ? ?Pt working with physical therapy at time of visit.  ? ?Pt currently on a regular diet. No meal completion data available to assess at this time.  ? ?Reviewed wt hx; pt has experienced a 1.6% wt loss over the past 3 months, which is not significant for time frame.  ? ?Pt with increased nutritional needs for post-operative healing and would benefit from addition of oral nutrition supplements.  ? ?Per TOC notes, plan for home health services at discharge. Pt refusing SNF.  ? ?Medications reviewed and include colace, ferrous sulfate, and remeron.  ? ?Labs reviewed: Phos: 1.4, Mg: 1.1, CBGS: 129.  ? ?Diet Order:   ?Diet Order   ? ?       ?  Diet regular Room service appropriate? Yes; Fluid consistency: Thin  Diet effective now       ?  ? ?  ?  ? ?  ? ? ?EDUCATION NEEDS:  ? ?No education needs have been identified  at this time ? ?Skin:  Skin Assessment: Skin Integrity Issues: ?Skin Integrity Issues:: Incisions ?Incisions: closed rt hip ? ?Last BM:  11/21/21 ? ?Height:  ? ?Ht Readings from Last 1 Encounters:  ?11/21/21 '5\' 8"'$  (1.727 m)  ? ? ?Weight:  ? ?Wt Readings from Last 1 Encounters:  ?11/21/21 65.8 kg  ? ? ?Ideal Body Weight:  70 kg ? ?BMI:  Body mass index is 22.05 kg/m?. ? ?Estimated Nutritional Needs:  ? ?Kcal:  1950-2150 ? ?Protein:  100-115 grams ? ?Fluid:  > 1.9 L ? ? ? ?Loistine Chance, RD, LDN, CDCES ?Registered Dietitian II ?Certified Diabetes Care and Education Specialist ?Please refer to [facility] for RD and/or RD on-call/weekend/after hours pager  ?

## 2021-11-22 NOTE — Progress Notes (Signed)
Physical Therapy Treatment ?Patient Details ?Name: [name] ?MRN: [id number] ?DOB: Jul 09, 1956 ?Today's Date: 11/22/2021 ? ? 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  PMH includes COPD, CAD, and MI. 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  PMH includes COPD, CAD, and MI. ? ?  ?PT Comments  ? ? Pt continued to require extra time, effort, and encouragement to participate during the session.  Pt remained somewhat confused at times but overall put forth more effort and made some progress towards goals this session.  Pt required frequent cuing for hip precaution compliance with functional tasks but was able to follow directions with improved accuracy and carryover.  Max amb tolerance remained limited and pt displayed poor confidence when discussing goal of attempting stair training next session.  Pt will benefit from PT services in a SNF setting upon discharge to safely address deficits listed in patient problem list for decreased caregiver assistance and eventual return to PLOF. ?   ?Recommendations for follow up therapy are one component of a multi-disciplinary discharge planning process, led by the attending physician.  Recommendations may be updated based on patient status, additional functional criteria and insurance authorization. ? ?Follow Up Recommendations ? Skilled nursing-short term rehab (<3 hours/day) ?  ?  ?Assistance Recommended at Discharge Frequent or constant Supervision/Assistance  ?Patient can return home with the following Assistance with cooking/housework;Direct supervision/assist for medications management;Assist for transportation;Help with stairs or ramp for entrance;A lot of help with walking and/or transfers;A little help with bathing/dressing/bathroom ?   ?Equipment Recommendations ? None recommended by PT  ?  ?Recommendations for Other Services   ? ? ?  ?Precautions / Restrictions Precautions ?Precautions: Fall;Posterior Hip ?Precaution Booklet Issued: Yes (comment) ?Restrictions ?Weight Bearing Restrictions: Yes ?RLE Weight Bearing: Weight bearing as tolerated  ?  ? ?Mobility ? Bed Mobility ?  ?Bed Mobility: Sit to Supine ?  ?  ?  ?Sit to supine: Min assist ?  ?General bed mobility comments: Min A for BLE control ?  ? ?Transfers ?Overall transfer level: Needs assistance ?Equipment used: Rolling walker (2 wheels) ?Transfers: Sit to/from Stand ?Sit to Stand: From elevated surface, Min assist ?  ?  ?  ?  ?  ?General transfer comment: mod verbal cues for sequencing for hip prec compliance ?  ? ?Ambulation/Gait ?Ambulation/Gait assistance: Min assist ?Gait Distance (Feet): 15 Feet x 2 ?Assistive device: Rolling walker (2 wheels) ?Gait Pattern/deviations: Step-to pattern, Trunk flexed, Decreased stance time - right, Decreased step length - left ?Gait velocity: decreased ?  ?  ?General Gait Details: Mod verbal and tactile cues for proper sequencing to prevent R hip IR during sharp right turns ? ? ?Stairs ?  ?  ?  ?  ?  ? ? ?Wheelchair Mobility ?  ? ?Modified Rankin (Stroke Patients Only) ?  ? ? ?  ?Balance Overall balance assessment: Needs assistance ?  ?Sitting balance-Leahy Scale: Good ?  ?  ?Standing balance support: Bilateral upper extremity supported, During functional activity, Reliant on assistive device for balance ?Standing balance-Leahy Scale: Poor ?  ?  ?  ?  ?  ?  ?  ?  ?  ?  ?  ?  ?  ? ?  ?Cognition Arousal/Alertness: Awake/alert ?Behavior During Therapy: Impulsive ?Overall Cognitive Status:  No family/caregiver present to determine baseline cognitive functioning ?  ?  ?  ?  ?  ?  ?  ?  ?  ?  ?  ?  ?  ?  ?  ?  ?General Comments: A&O to self, location, date. Required heavy cuing and extra time to follow commands, poor safety awareness, somewhat confused. ?  ?   ? ?  ?Exercises Total Joint Exercises ?Ankle Circles/Pumps: AROM, Strengthening, Both, 10 reps ?Long Arc Quad: AROM, Strengthening, Both, 10 reps ?Knee Flexion: AROM, Strengthening, Both, 10 reps ?Other Exercises ?Other Exercises: Posterior hip precaution education vebally, during functional tasks, and review of handouts with pt and sister who will be pt's caregiver at discharge ?Other Exercises: Car transfer sequencing education with pt and sister ?Other Exercises: Max encouragment for increased participation during the session ? ?  ?General Comments   ?  ?  ? ?Pertinent Vitals/Pain Pain Assessment ?Pain Assessment: 0-10 ?Pain Score: 7  ?Pain Location: R hip ?Pain Descriptors / Indicators: Sore ?Pain Intervention(s): Repositioned, Premedicated before session, Monitored during session, Ice applied  ? ? ?Home Living   ?  ?  ?  ?  ?  ?  ?  ?  ?  ?   ?  ?Prior Function    ?  ?  ?   ? ?PT Goals (current goals can now be found in the care plan section) Progress towards PT goals: Progressing toward goals ? ?  ?Frequency ? ? ? BID ? ? ? ?  ?PT Plan Current plan remains appropriate  ? ? ?Co-evaluation   ?  ?  ?  ?  ? ?  ?AM-PAC PT "6 Clicks" Mobility   ?Outcome Measure ? Help needed turning from your back to your side while in a flat bed without using bedrails?: A Lot ?Help needed moving from lying on your back to sitting on the side of a flat bed without using bedrails?: A Lot ?Help needed moving to and from a bed to a chair (including a wheelchair)?: A Lot ?Help needed standing up from a chair using your arms (e.g., wheelchair or bedside chair)?: A Lot ?Help needed to walk in hospital room?: A Lot ?Help needed climbing 3-5 steps with a railing? : Total ?6 Click Score: 11 ? ?  ?End of Session Equipment Utilized During Treatment: Gait belt ?Activity Tolerance: Patient tolerated treatment well ?Patient left: with call bell/phone within reach;in bed;with bed alarm set;with family/visitor present ?Nurse Communication:  Mobility status;Weight bearing status;Precautions;Other (comment) (pt declined SCDs) ?PT Visit Diagnosis: Other abnormalities of gait and mobility (R26.89);Muscle weakness (generalized) (M62.81);Pain ?Pain - Right/Left: Right ?Pain - part of body: Hip ?  ? ? ?Time: 3500-9381 ?PT Time Calculation (min) (ACUTE ONLY): 41 min ? ?Charges:  $Gait Training: 8-22 mins ?$Therapeutic Exercise: 8-22 mins ?$Therapeutic Activity: 23-37 mins          ?          ? ?D. Royetta Asal PT, DPT ?11/22/21, 3:24 PM ? ? ?

## 2021-11-22 NOTE — Progress Notes (Signed)
Physical Therapy Treatment ?Patient Details ?Name: [name] ?MRN: [id number] ?DOB: 1956/06/13 ?Today's Date: 11/22/2021 ? ? 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  PMH includes COPD, CAD, and MI. 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  PMH includes COPD, CAD, and MI. ? ?  ?PT Comments  ? ? Pt required extensive encouragement and processing time to perform all tasks.  Pt demonstrated poor safety awareness and carryover of proper sequencing of functional tasks for hip precaution compliance and required heavy cuing throughout.  Pt  ambulated 2 x 6 feet but declined to attempt further ambulation due to R hip pain.  Due to very limited functional activity tolerance and poor safety awareness discharge recommendation has been update from home with HHPT to SNF.  Pt will benefit from PT services in a SNF setting upon discharge to safely address deficits listed in patient problem list for decreased caregiver assistance and eventual return to PLOF. ?  ?Recommendations for follow up therapy are one component of a multi-disciplinary discharge planning process, led by the attending physician.  Recommendations may be updated based on patient status, additional functional criteria and insurance authorization. ? ?Follow Up Recommendations ? Skilled nursing-short term rehab (<3 hours/day) ?  ?  ?Assistance Recommended at Discharge Frequent or constant Supervision/Assistance  ?Patient can return home with the following Assistance with cooking/housework;Direct supervision/assist for medications management;Assist for transportation;Help with stairs or ramp for entrance;A lot of help with walking and/or transfers;A little help with bathing/dressing/bathroom ?  ?Equipment Recommendations ? None  recommended by PT  ?  ?Recommendations for Other Services   ? ? ?  ?Precautions / Restrictions Precautions ?Precautions: Fall;Posterior Hip ?Precaution Booklet Issued: Yes (comment) ?Restrictions ?Weight Bearing Restrictions: Yes ?RLE Weight Bearing: Weight bearing as tolerated  ?  ? ?Mobility ? Bed Mobility ?  ?  ?  ?  ?  ?  ?  ?General bed mobility comments: NT, pt in recliner ?  ? ?Transfers ?Overall transfer level: Needs assistance ?Equipment used: Rolling walker (2 wheels) ?Transfers: Sit to/from Stand ?Sit to Stand: From elevated surface, Min assist ?  ?  ?  ?  ?  ?General transfer comment: Max verbal cues for sequencing for hip prec compliance ?  ? ?Ambulation/Gait ?Ambulation/Gait assistance: Min assist ?Gait Distance (Feet): 6 Feet x 2 ?Assistive device: Rolling walker (2 wheels) ?Gait Pattern/deviations: Step-to pattern, Trunk flexed, Decreased stance time - right, Decreased step length - left ?Gait velocity: decreased ?  ?  ?General Gait Details: Max verbal and tactile cues for proper sequencing to prevent R hip IR during sharp right turns ? ? ?Stairs ?  ?  ?  ?  ?  ? ? ?Wheelchair Mobility ?  ? ?Modified Rankin (Stroke Patients Only) ?  ? ? ?  ?Balance Overall balance assessment: Needs assistance ?  ?Sitting balance-Leahy Scale: Good ?  ?  ?Standing balance support: Bilateral upper extremity supported, During functional activity, Reliant on assistive device for balance ?Standing balance-Leahy Scale: Poor ?  ?  ?  ?  ?  ?  ?  ?  ?  ?  ?  ?  ?  ? ?  ?Cognition Arousal/Alertness: Awake/alert ?Behavior During Therapy: Impulsive ?Overall Cognitive Status: No family/caregiver present to determine baseline cognitive functioning ?  ?  ?  ?  ?  ?  ?  ?  ?  ?  ?  ?  ?  ?  ?  ?  ?  General Comments: A&O to self, location, date. Required heavy cuing and extra time to follow commands, poor safety awareness, somewhat confused. ?  ?  ? ?  ?Exercises Total Joint Exercises ?Ankle Circles/Pumps: AROM, Strengthening, Both,  10 reps ?Long Arc Quad: AROM, Strengthening, Both, 10 reps ?Knee Flexion: AROM, Strengthening, Both, 10 reps ?Other Exercises ?Other Exercises: Posterior hip precaution education vebally, during functional tasks, and review of handouts ?Other Exercises: Max education on benefits of RW use of SPC during acute phase of hip replacement rehab ?Other Exercises: Max encouragment for increased participation during the session ? ?  ?General Comments   ?  ?  ? ?Pertinent Vitals/Pain Pain Assessment ?Pain Assessment: 0-10 ?Pain Score: 8  ?Pain Location: R hip ?Pain Descriptors / Indicators: Sore ?Pain Intervention(s): Repositioned, Premedicated before session, Monitored during session, Ice applied  ? ? ?Home Living   ?  ?  ?  ?  ?  ?  ?  ?  ?  ?   ?  ?Prior Function    ?  ?  ?   ? ?PT Goals (current goals can now be found in the care plan section) Progress towards PT goals: PT to reassess next treatment ? ?  ?Frequency ? ? ? BID ? ? ? ?  ?PT Plan Discharge plan needs to be updated  ? ? ?Co-evaluation   ?  ?  ?  ?  ? ?  ?AM-PAC PT "6 Clicks" Mobility   ?Outcome Measure ? Help needed turning from your back to your side while in a flat bed without using bedrails?: A Lot ?Help needed moving from lying on your back to sitting on the side of a flat bed without using bedrails?: A Lot ?Help needed moving to and from a bed to a chair (including a wheelchair)?: A Lot ?Help needed standing up from a chair using your arms (e.g., wheelchair or bedside chair)?: A Lot ?Help needed to walk in hospital room?: A Lot ?Help needed climbing 3-5 steps with a railing? : Total ?6 Click Score: 11 ? ?  ?End of Session Equipment Utilized During Treatment: Gait belt ?Activity Tolerance: Patient tolerated treatment well ?Patient left: in chair;with call bell/phone within reach;with chair alarm set ?Nurse Communication: Mobility status;Weight bearing status;Precautions;Other (comment) (Pt refused SCDs) ?PT Visit Diagnosis: Other abnormalities of gait and  mobility (R26.89);Muscle weakness (generalized) (M62.81);Pain ?Pain - Right/Left: Right ?Pain - part of body: Hip ?  ? ? ?Time: 1010-1054 ?PT Time Calculation (min) (ACUTE ONLY): 44 min ? ?Charges:  $Gait Training: 8-22 mins ?$Therapeutic Exercise: 8-22 mins ?$Therapeutic Activity: 8-22 mins          ?          ? ?D. Royetta Asal PT, DPT ?11/22/21, 1:13 PM ? ? ?

## 2021-11-22 NOTE — Assessment & Plan Note (Signed)
-   Repeat CBC with differential and tech to review smear ?

## 2021-11-22 NOTE — Anesthesia Postprocedure Evaluation (Signed)
Anesthesia Post Note ? ?Patient: [name] ? ?Procedure(s) Performed: TOTAL HIP REVISION (Right: Hip) ? ?Patient location during evaluation: Nursing Unit ?Anesthesia Type: Spinal ?Level of consciousness: oriented and awake and alert ?Pain management: pain level controlled ?Vital Signs Assessment: post-procedure vital signs reviewed and stable ?Respiratory status: spontaneous breathing and respiratory function stable ?Cardiovascular status: blood pressure returned to baseline and stable ?Postop Assessment: no headache, no backache, no apparent nausea or vomiting and patient able to bend at knees ?Anesthetic complications: no ? ? ?No notable events documented. ? ? ?Last Vitals:  ?Vitals:  ? 11/22/21 0542 11/22/21 0725  ?BP:  132/61  ?Pulse: (!) 110 (!) 116  ?Resp:  17  ?Temp:  37.1 ?C  ?SpO2:  97%  ?  ?Last Pain:  ?Vitals:  ? 11/22/21 0617  ?TempSrc:   ?PainSc: 4   ? ? ?  ?  ?  ?  ?  ?  ? ?Doreen Salvage A ? ? ? ? ?

## 2021-11-22 NOTE — Consult Note (Signed)
CODE SEPSIS - PHARMACY COMMUNICATION ? ?**Broad Spectrum Antibiotics should be administered within 1 hour of Sepsis diagnosis** ? ?Time Code Sepsis Called/Page Received: 2058 ? ?Antibiotics Ordered: Azithro,rocephin,cefazolin(1x dose during surgical procedure) ? ?Time of 1st antibiotic administration: 2304(on 11/21/21) ? ?Additional action taken by pharmacy: none ? ?If necessary, Name of Provider/Nurse Contacted: n/a ? ? ? ?Pearla Dubonnet ,PharmD ?Clinical Pharmacist  ?11/22/2021  9:29 PM ? ?

## 2021-11-22 NOTE — Assessment & Plan Note (Signed)
Per primary team. °

## 2021-11-22 NOTE — Progress Notes (Signed)
?  Subjective: ? ?Patient reports pain as moderate.  Sister is in the room. ? ?Objective:  ? ?VITALS:   ?Vitals:  ? 11/22/21 0542 11/22/21 0725 11/22/21 0928 11/22/21 1110  ?BP:  132/61 (!) 123/50 120/70  ?Pulse: (!) 110 (!) 116 (!) 110 (!) 117  ?Resp:  '17 18 15  '$ ?Temp:  98.7 ?F (37.1 ?C) 99.3 ?F (37.4 ?C) 98.7 ?F (37.1 ?C)  ?TempSrc:    Oral  ?SpO2:  97% 98% 99%  ?Weight:      ?Height:      ? ? ?PHYSICAL EXAM: ? ?ABD soft ?Sensation intact distally ?Dorsiflexion/Plantar flexion intact ?Incision: dressing C/D/I ?No cellulitis present ?Compartment soft ? ?LABS ? ?Results for orders placed or performed during the hospital encounter of 11/21/21 (from the past 24 hour(s))  ?CBC     Status: Abnormal  ? Collection Time: 11/22/21  4:20 AM  ?Result Value Ref Range  ? WBC 10.8 (H) 4.0 - 10.5 K/uL  ? RBC 2.80 (L) 4.22 - 5.81 MIL/uL  ? Hemoglobin 8.4 (L) 13.0 - 17.0 g/dL  ? HCT 27.3 (L) 39.0 - 52.0 %  ? MCV 97.5 80.0 - 100.0 fL  ? MCH 30.0 26.0 - 34.0 pg  ? MCHC 30.8 30.0 - 36.0 g/dL  ? RDW 20.8 (H) 11.5 - 15.5 %  ? Platelets 73 (L) 150 - 400 K/uL  ? nRBC 0.0 0.0 - 0.2 %  ?Basic metabolic panel     Status: Abnormal  ? Collection Time: 11/22/21  4:20 AM  ?Result Value Ref Range  ? Sodium 140 135 - 145 mmol/L  ? Potassium 5.1 3.5 - 5.1 mmol/L  ? Chloride 110 98 - 111 mmol/L  ? CO2 23 22 - 32 mmol/L  ? Glucose, Bld 110 (H) 70 - 99 mg/dL  ? BUN 30 (H) 8 - 23 mg/dL  ? Creatinine, Ser 2.37 (H) 0.61 - 1.24 mg/dL  ? Calcium 8.6 (L) 8.9 - 10.3 mg/dL  ? GFR, Estimated 29 (L) >60 mL/min  ? Anion gap 7 5 - 15  ? ? ?DG Pelvis Portable ? ?Result Date: 11/21/2021 ?CLINICAL DATA:  Postop. EXAM: PORTABLE PELVIS 1-2 VIEWS COMPARISON:  No prior hip imaging available. FINDINGS: Right hip arthroplasty in place. No periprosthetic fracture. 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 Multiple surgical clips project over the intertrochanteric femur. Recent postsurgical change includes air and edema in the  soft tissues. Lateral skin staples in place. Prior left hip arthroplasty in place. IMPRESSION: Right hip arthroplasty without immediate postoperative complication. Electronically Signed   By: Keith Rake M.D.   On: 11/21/2021 15:47   ? ?Assessment/Plan: ?1 Day Post-Op  ? ?Principal Problem: ?  History of revision of total replacement of right hip joint ? ? ?Up with therapy ?Discharge home with home health likely tomorrow based on his progress ?Continue posterior hip precautions ? ? ? ?Lovell Sheehan , MD ?11/22/2021, 12:28 PM ? ? ? ? ? ? ?

## 2021-11-22 NOTE — Sepsis Progress Note (Signed)
Sepsis protocol is being followed by [facility]. 

## 2021-11-23 ENCOUNTER — Other Ambulatory Visit: Payer: Medicare Other

## 2021-11-23 ENCOUNTER — Inpatient Hospital Stay: Payer: Medicare Other

## 2021-11-23 DIAGNOSIS — R7989 Other specified abnormal findings of blood chemistry: Secondary | ICD-10-CM | POA: Diagnosis present

## 2021-11-23 DIAGNOSIS — N179 Acute kidney failure, unspecified: Secondary | ICD-10-CM | POA: Diagnosis present

## 2021-11-23 DIAGNOSIS — J811 Chronic pulmonary edema: Secondary | ICD-10-CM | POA: Diagnosis present

## 2021-11-23 DIAGNOSIS — Z96641 Presence of right artificial hip joint: Secondary | ICD-10-CM | POA: Diagnosis not present

## 2021-11-23 LAB — COMPREHENSIVE METABOLIC PANEL
ALT: 6 U/L (ref 0–44)
AST: 40 U/L (ref 15–41)
Albumin: 2.2 g/dL — ABNORMAL LOW (ref 3.5–5.0)
Alkaline Phosphatase: 60 U/L (ref 38–126)
Anion gap: 8 (ref 5–15)
BUN: 47 mg/dL — ABNORMAL HIGH (ref 8–23)
CO2: 23 mmol/L (ref 22–32)
Calcium: 8.5 mg/dL — ABNORMAL LOW (ref 8.9–10.3)
Chloride: 108 mmol/L (ref 98–111)
Creatinine, Ser: 3.15 mg/dL — ABNORMAL HIGH (ref 0.61–1.24)
GFR, Estimated: 21 mL/min — ABNORMAL LOW (ref >60)
Glucose, Bld: 121 mg/dL — ABNORMAL HIGH (ref 70–99)
Potassium: 5.2 mmol/L — ABNORMAL HIGH (ref 3.5–5.1)
Sodium: 139 mmol/L (ref 135–145)
Total Bilirubin: 3.8 mg/dL — ABNORMAL HIGH (ref 0.3–1.2)
Total Protein: 6.2 g/dL — ABNORMAL LOW (ref 6.5–8.1)

## 2021-11-23 LAB — TYPE AND SCREEN
ABO/RH(D): AB POS
Antibody Screen: NEGATIVE

## 2021-11-23 LAB — CBC
HCT: 23.4 % — ABNORMAL LOW (ref 39.0–52.0)
Hemoglobin: 7.2 g/dL — ABNORMAL LOW (ref 13.0–17.0)
MCH: 29.4 pg (ref 26.0–34.0)
MCHC: 30.8 g/dL (ref 30.0–36.0)
MCV: 95.5 fL (ref 80.0–100.0)
Platelets: 74 10*3/uL — ABNORMAL LOW (ref 150–400)
RBC: 2.45 MIL/uL — ABNORMAL LOW (ref 4.22–5.81)
RDW: 20.1 % — ABNORMAL HIGH (ref 11.5–15.5)
WBC: 10 10*3/uL (ref 4.0–10.5)
nRBC: 0 % (ref 0.0–0.2)

## 2021-11-23 LAB — PROCALCITONIN: Procalcitonin: 0.9 ng/mL

## 2021-11-23 MED ORDER — SODIUM CHLORIDE 0.9 % IV SOLN: Freq: Once | INTRAVENOUS | Status: DC

## 2021-11-23 MED ORDER — DEXTROSE-NACL 2.5-0.45 % IV SOLN: INTRAVENOUS | Status: DC

## 2021-11-23 MED ORDER — METOPROLOL TARTRATE 25 MG PO TABS
25.0000 mg | ORAL_TABLET | Freq: Two times a day (BID) | ORAL | Status: DC
Start: 2021-11-23 — End: 2021-11-24

## 2021-11-23 NOTE — Progress Notes (Signed)
Cross Cover ?Contacted by ortho on call. Nurses report patient agitated and wanting to leave.   ?Patient is alert an oriented, somewhat paranoid and depressed. He did agree to rest tonight but is refusing any more IV fluids/meds. Did drink water for me and agreed to drink more. Comfort needs met and assisted patient into getting position of comfort in bed and adjusted lights.  Could benefit from psych consult related to his paranoia but according to nurse, he refused that offer today. Will defer to day team for further management ?

## 2021-11-23 NOTE — Assessment & Plan Note (Signed)
-   On baseline CKD 3B ?- Presumed volume overload/pulmonary edema ?- Ethacrynic acid ?- Strict I's and O's ?- BMP in the a.m. ?

## 2021-11-23 NOTE — Progress Notes (Signed)
?Subjective: ? ?Patient reports pain as mild to moderate.   ? ?Objective:  ? ?VITALS:   ?Vitals:  ? 11/23/21 0100 11/23/21 0552 11/23/21 0700 11/23/21 0915  ?BP: (!) 114/56 (!) 117/54 (!) 125/53 130/60  ?Pulse: (!) 106 (!) 107 (!) 113 (!) 115  ?Resp: '18 16 17 18  '$ ?Temp: 99.1 ?F (37.3 ?C) 98.3 ?F (36.8 ?C) 98.4 ?F (36.9 ?C) 98.5 ?F (36.9 ?C)  ?TempSrc: Oral  Oral Oral  ?SpO2: 99% 97% 96%   ?Weight:      ?Height:      ? ? ?PHYSICAL EXAM: ? ?Neurologically intact ?ABD soft ?Neurovascular intact ?Sensation intact distally ?Intact pulses distally ?Dorsiflexion/Plantar flexion intact ?Incision: dressing C/D/I ?No cellulitis present ?Compartment soft ? ?LABS ? ?Results for orders placed or performed during the hospital encounter of 11/21/21 (from the past 24 hour(s))  ?Troponin I (High Sensitivity)     Status: Abnormal  ? Collection Time: 11/22/21  7:14 PM  ?Result Value Ref Range  ? Troponin I (High Sensitivity) 34 (H) <18 ng/L  ?D-dimer, quantitative     Status: Abnormal  ? Collection Time: 11/22/21  7:14 PM  ?Result Value Ref Range  ? D-Dimer, Quant 1.71 (H) 0.00 - 0.50 ug/mL-FEU  ?Procalcitonin - Baseline     Status: None  ? Collection Time: 11/22/21  7:14 PM  ?Result Value Ref Range  ? Procalcitonin 0.91 ng/mL  ?CBC with Differential/Platelet     Status: Abnormal  ? Collection Time: 11/22/21  7:14 PM  ?Result Value Ref Range  ? WBC 12.6 (H) 4.0 - 10.5 K/uL  ? RBC 2.61 (L) 4.22 - 5.81 MIL/uL  ? Hemoglobin 7.8 (L) 13.0 - 17.0 g/dL  ? HCT 25.2 (L) 39.0 - 52.0 %  ? MCV 96.6 80.0 - 100.0 fL  ? MCH 29.9 26.0 - 34.0 pg  ? MCHC 31.0 30.0 - 36.0 g/dL  ? RDW 20.5 (H) 11.5 - 15.5 %  ? Platelets 80 (L) 150 - 400 K/uL  ? nRBC 0.0 0.0 - 0.2 %  ? Neutrophils Relative % 82 %  ? Neutro Abs 10.3 (H) 1.7 - 7.7 K/uL  ? Lymphocytes Relative 8 %  ? Lymphs Abs 1.0 0.7 - 4.0 K/uL  ? Monocytes Relative 8 %  ? Monocytes Absolute 1.0 0.1 - 1.0 K/uL  ? Eosinophils Relative 2 %  ? Eosinophils Absolute 0.2 0.0 - 0.5 K/uL  ? Basophils Relative 0 %   ? Basophils Absolute 0.0 0.0 - 0.1 K/uL  ? WBC Morphology MORPHOLOGY UNREMARKABLE   ? RBC Morphology MORPHOLOGY UNREMARKABLE   ? Smear Review MORPHOLOGY UNREMARKABLE   ? Immature Granulocytes 0 %  ? Abs Immature Granulocytes 0.05 0.00 - 0.07 K/uL  ?Lactic acid, plasma     Status: None  ? Collection Time: 11/22/21  8:54 PM  ?Result Value Ref Range  ? Lactic Acid, Venous 1.8 0.5 - 1.9 mmol/L  ?Creatinine, serum     Status: Abnormal  ? Collection Time: 11/22/21  8:54 PM  ?Result Value Ref Range  ? Creatinine, Ser 3.17 (H) 0.61 - 1.24 mg/dL  ? GFR, Estimated 21 (L) >60 mL/min  ?Culture, blood (x 2)     Status: None (Preliminary result)  ? Collection Time: 11/22/21  9:44 PM  ? Specimen: BLOOD  ?Result Value Ref Range  ? Specimen Description BLOOD RAC   ? Special Requests BOTTLES DRAWN AEROBIC AND ANAEROBIC BCAV   ? Culture    ?  NO GROWTH < 12 HOURS ?Performed at [facility], [address], [place], [place] [address] ?  ? Report Status PENDING   ?Culture, blood (x 2)     Status: None (Preliminary result)  ? Collection Time: 11/22/21  9:46 PM  ? Specimen: BLOOD  ?Result Value Ref Range  ? Specimen Description BLOOD LAC   ? Special Requests BOTTLES DRAWN AEROBIC AND ANAEROBIC BCAV   ? Culture    ?  NO GROWTH < 12 HOURS ?Performed at [facility], [address] [place], [place] [address] ?  ? Report Status PENDING   ?Lactic acid, plasma     Status: None  ? Collection Time: 11/22/21 10:25 PM  ?Result Value Ref Range  ? Lactic Acid, Venous 1.3 0.5 - 1.9 mmol/L  ?Troponin I (High Sensitivity)     Status: Abnormal  ? Collection Time: 11/22/21 10:25 PM  ?Result Value Ref Range  ? Troponin I (High Sensitivity) 45 (H) <18 ng/L  ?Type and screen [place]     Status: None  ? Collection Time: 11/23/21 12:45 AM  ?Result Value Ref Range  ? ABO/RH(D) AB POS   ? Antibody Screen NEG   ? Sample Expiration    ?  11/26/2021,2359 ?Performed at [facility], [address],  [place], [place] [address] ?  ?CBC     Status: Abnormal  ? Collection Time: 11/23/21  5:01 AM  ?Result Value Ref Range  ? WBC 10.0 4.0 - 10.5 K/uL  ? RBC 2.45 (L) 4.22 - 5.81 MIL/uL  ? Hemoglobin 7.2 (L) 13.0 - 17.0 g/dL  ? HCT 23.4 (L) 39.0 - 52.0 %  ? MCV 95.5 80.0 - 100.0 fL  ? MCH 29.4 26.0 - 34.0 pg  ? MCHC 30.8 30.0 - 36.0 g/dL  ? RDW 20.1 (H) 11.5 - 15.5 %  ? Platelets 74 (L) 150 - 400 K/uL  ? nRBC 0.0 0.0 - 0.2 %  ?Comprehensive metabolic panel     Status: Abnormal  ? Collection Time: 11/23/21  5:01 AM  ?Result Value Ref Range  ? Sodium 139 135 - 145 mmol/L  ? Potassium 5.2 (H) 3.5 - 5.1 mmol/L  ? Chloride 108 98 - 111 mmol/L  ? CO2 23 22 - 32 mmol/L  ? Glucose, Bld 121 (H) 70 - 99 mg/dL  ? BUN 47 (H) 8 - 23 mg/dL  ? Creatinine, Ser 3.15 (H) 0.61 - 1.24 mg/dL  ? Calcium 8.5 (L) 8.9 - 10.3 mg/dL  ? Total Protein 6.2 (L) 6.5 - 8.1 g/dL  ? Albumin 2.2 (L) 3.5 - 5.0 g/dL  ? AST 40 15 - 41 U/L  ? ALT 6 0 - 44 U/L  ? Alkaline Phosphatase 60 38 - 126 U/L  ? Total Bilirubin 3.8 (H) 0.3 - 1.2 mg/dL  ? GFR, Estimated 21 (L) >60 mL/min  ? Anion gap 8 5 - 15  ?Procalcitonin     Status: None  ? Collection Time: 11/23/21  5:01 AM  ?Result Value Ref Range  ? Procalcitonin 0.90 ng/mL  ? ? ?CT HEAD WO CONTRAST (5MM) ? ?Result Date: 11/22/2021 ?CLINICAL DATA:  Altered mental status. EXAM: CT HEAD WITHOUT CONTRAST TECHNIQUE: Contiguous axial images were obtained from the base of the skull through the vertex without intravenous contrast. RADIATION DOSE REDUCTION: This exam was performed according to the departmental dose-optimization program which includes automated exposure control, adjustment of the mA and/or kV according to patient size and/or use of iterative reconstruction technique. COMPARISON:  None available. FINDINGS: Brain:  No intracranial hemorrhage, mass effect, or midline shift. No hydrocephalus. The basilar cisterns are patent. No evidence of territorial infarct or acute ischemia. Normal brain volume for age. No  extra-axial or intracranial fluid collection. Vascular: Atherosclerosis of skullbase vasculature without hyperdense vessel or abnormal calcification. Skull: No fracture or focal lesion. Sinuses/Orbits: Minimal opacification of lower left mastoid air cells. Scattered mucosal thickening of ethmoid air cells. No sinus fluid levels. Unremarkable orbits Other: None. IMPRESSION: No acute intracranial abnormality. Electronically Signed   By: Keith Rake M.D.   On: 11/22/2021 20:33  ? ?DG Pelvis Portable ? ?Result Date: 11/21/2021 ?CLINICAL DATA:  Postop. EXAM: PORTABLE PELVIS 1-2 VIEWS COMPARISON:  No prior hip imaging available. FINDINGS: Right hip arthroplasty in place. No periprosthetic fracture. 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 Multiple surgical clips project over the intertrochanteric femur. Recent postsurgical change includes air and edema in the soft tissues. Lateral skin staples in place. Prior left hip arthroplasty in place. IMPRESSION: Right hip arthroplasty without immediate postoperative complication. Electronically Signed   By: Keith Rake M.D.   On: 11/21/2021 15:47  ? ?US Venous Img Lower Bilateral (DVT) ? ?Result Date: 11/23/2021 ?CLINICAL DATA:  Leg swelling EXAM: BILATERAL LOWER EXTREMITY VENOUS DOPPLER ULTRASOUND TECHNIQUE: Gray-scale sonography with compression, as well as color and duplex ultrasound, were performed to evaluate the deep venous system(s) from the level of the common femoral vein through the popliteal and proximal calf veins. COMPARISON:  None FINDINGS: VENOUS Normal compressibility of BILATERAL common femoral, superficial femoral, and popliteal veins, as well as the visualized calf veins. Visualized portions of BILATERAL profunda femoral vein and great saphenous vein unremarkable. No filling defects to suggest DVT on grayscale or color Doppler imaging. Doppler waveforms show normal direction of venous flow, normal  respiratory plasticity and response to augmentation. OTHER None. Limitations: none IMPRESSION: No evidence of deep venous thrombosis in either lower extremity. Electronically Signed   By: Lavonia Dana M.D.   On: 11/23/2021

## 2021-11-23 NOTE — Progress Notes (Signed)
[place] at [facility] ? ? ?PATIENT NAME: [name]   ? ?MR#:  [id number] ? ?DATE OF BIRTH:  [birth year] ? ?SUBJECTIVE:  ?patient has been refusing tests telling monitor, taking PO meds since this morning. He is been frustrated and wants to go home. Continues to be tachycardic in 115 to 120. Worked with physical therapy. ? ? ? ?VITALS:  ?Blood pressure (!) 118/51, pulse (!) 121, temperature 99 ?F (37.2 ?C), resp. rate 18, height '5\' 8"'$  (1.727 m), weight 65.8 kg, SpO2 95 %. ? ?PHYSICAL EXAMINATION:  ? ?GENERAL:  66 y.o.-year-old patient lying in the bed with no acute distress. Thin cachectic ?LUNGS distant breath sounds bilaterally, no wheezing, rales, rhonchi.  ?CARDIOVASCULAR: S1, S2 normal. No murmurs. Tachycardia ?ABDOMEN: Soft, nontender, nondistended. Bowel sounds present.  ?EXTREMITIES: No  edema b/l.    ?NEUROLOGIC: nonfocal  patient is alert and awake ?SKIN: per RN ? ?LABORATORY PANEL:  ?CBC ?Recent Labs  ?Lab 11/23/21 ?0501  ?WBC 10.0  ?HGB 7.2*  ?HCT 23.4*  ?PLT 74*  ? ? ?Chemistries  ?Recent Labs  ?Lab 11/23/21 ?0501  ?NA 139  ?K 5.2*  ?CL 108  ?CO2 23  ?GLUCOSE 121*  ?BUN 47*  ?CREATININE 3.15*  ?CALCIUM 8.5*  ?AST 40  ?ALT 6  ?ALKPHOS 60  ?BILITOT 3.8*  ? ?Cardiac Enzymes ?No results for input(s): TROPONINI in the last 168 hours. ?RADIOLOGY:  ?CT HEAD WO CONTRAST (5MM) ? ?Result Date: 11/22/2021 ?CLINICAL DATA:  Altered mental status. EXAM: CT HEAD WITHOUT CONTRAST TECHNIQUE: Contiguous axial images were obtained from the base of the skull through the vertex without intravenous contrast. RADIATION DOSE REDUCTION: This exam was performed according to the departmental dose-optimization program which includes automated exposure control, adjustment of the mA and/or kV according to patient size and/or use of iterative reconstruction technique. COMPARISON:  None available. FINDINGS: Brain: No intracranial hemorrhage, mass effect, or midline shift. No hydrocephalus. The basilar  cisterns are patent. No evidence of territorial infarct or acute ischemia. Normal brain volume for age. No extra-axial or intracranial fluid collection. Vascular: Atherosclerosis of skullbase vasculature without hyperdense vessel or abnormal calcification. Skull: No fracture or focal lesion. Sinuses/Orbits: Minimal opacification of lower left mastoid air cells. Scattered mucosal thickening of ethmoid air cells. No sinus fluid levels. Unremarkable orbits Other: None. IMPRESSION: No acute intracranial abnormality. Electronically Signed   By: Keith Rake M.D.   On: 11/22/2021 20:33  ? ?US Venous Img Lower Bilateral (DVT) ? ?Result Date: 11/23/2021 ?CLINICAL DATA:  Leg swelling EXAM: BILATERAL LOWER EXTREMITY VENOUS DOPPLER ULTRASOUND TECHNIQUE: Gray-scale sonography with compression, as well as color and duplex ultrasound, were performed to evaluate the deep venous system(s) from the level of the common femoral vein through the popliteal and proximal calf veins. COMPARISON:  None FINDINGS: VENOUS Normal compressibility of BILATERAL common femoral, superficial femoral, and popliteal veins, as well as the visualized calf veins. Visualized portions of BILATERAL profunda femoral vein and great saphenous vein unremarkable. No filling defects to suggest DVT on grayscale or color Doppler imaging. Doppler waveforms show normal direction of venous flow, normal respiratory plasticity and response to augmentation. OTHER None. Limitations: none IMPRESSION: No evidence of deep venous thrombosis in either lower extremity. Electronically Signed   By: Lavonia Dana M.D.   On: 11/23/2021 08:56  ? ?DG Chest Port 1 View ? ?Result Date: 11/22/2021 ?CLINICAL DATA:  Shortness of breath. EXAM: PORTABLE CHEST 1 VIEW COMPARISON:  05/01/2017, chest CT 10/16/2017 FINDINGS: Upper normal heart  size. Normal mediastinal contours with aortic atherosclerosis. There is diffuse peribronchial thickening. No confluent consolidation. No pneumothorax or  large pleural effusion. Emphysematous changes were better demonstrated on prior CT IMPRESSION: 1. Diffuse peribronchial thickening, may be bronchial inflammation or pulmonary edema. 2. Upper normal heart size. 3. Emphysematous changes were better demonstrated on prior CT. Electronically Signed   By: Keith Rake M.D.   On: 11/22/2021 20:02   ? ?Assessment and Plan ?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 ? ?AK I on CKD IIIB ?-- clinically appears dry ?-- I will start patient on IV fluids if he allows to get hooked up with ?-- monitor input output ?-- baseline creatinine 1.85--- 3.1 ?-- avoid nephrotoxic ends ? ?tachycardia could be due to dehydration ?patient also has been anxious to leave the hospital ?-- will start on metoprolol 25 BID ?-- patient refused VQ scan ? ?elevated troponin secondary to demand ischemia ?-- no chest pain ?-- 34-- 37 ? ?COPD ?-- sats stable ?-- continue bronchodilators as needed ? ?History of revision of total replacement of right hip joint ?- Per primary team ? ? ? ?Procedures: ?Family communication : sister in the room ?  ? ?TOTAL TIME TAKING CARE OF THIS PATIENT: 25 minutes.  ?>50% time spent on counselling and coordination of care ? ?Note: This dictation was prepared with Dragon dictation along with smaller phrase technology. Any transcriptional errors that result from this process are unintentional. ? ?Fritzi Mandes M.D  ? ? ?[facility]  ? ?CC: ?Primary care physician; Josetta Huddle, MD  ?

## 2021-11-23 NOTE — Progress Notes (Signed)
PT Cancellation Note ? ?Patient Details ?Name: [name] ?MRN: [id number] ?DOB: 1956/07/16 ? ? ?Cancelled Treatment:    Reason Eval/Treat Not Completed: Patient at procedure or test/unavailable, will attempt to see pt at a future date/time as medically appropriate.  ? ? ?D. Royetta Asal PT, DPT ?11/23/21, 8:19 AM ? ? ?

## 2021-11-23 NOTE — Assessment & Plan Note (Addendum)
-   With elevated heart rate, persistent ?- CTA of the chest cannot be ordered due to acute kidney injury ?- VQ scan ordered, ultrasound of lower extremity DVT ordered ?

## 2021-11-23 NOTE — Progress Notes (Signed)
Occupational Therapy Treatment ?Patient Details ?Name: [name] ?MRN: [id number] ?DOB: 21-Aug-1955 ?Today's Date: 11/23/2021 ? ? 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  PMH includes COPD, CAD, and MI. 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  PMH includes COPD, CAD, and MI. ?  ?OT comments ? [name] has made progress in his fxl mobility compared to yesterday's OT session. He is up in recliner today, able to transfer sit<>stand without physical assistance, demonstrates improved balance during ambulation with RW, and indicates interest in working with rehab professionals. He does demonstrate, however, some cognitive impairments and confusion. He is able to recall only 1 of 3 posterior hip precautions, states the he believes some hospital personnel have "abandoned" him, does not seem to be able to understand why he is having hip pain at present, and expresses concern that, because a nurse had recently had difficulty starting an IV in his R UE, he was going to have to have his arm amputated, saying that there was "no blood flow" in his arm. Provided reassurance, education on hip precautions. Continue to recommend DC to SNF, as well as potentially a psych consult, given the possibility that pt's confusion could negatively affect his rehab process.  ? ?Recommendations for follow up therapy are one component of a multi-disciplinary discharge planning process, led by the attending physician.  Recommendations may be updated based on patient status, additional functional criteria and insurance authorization. ?   ?Follow Up Recommendations ? Skilled nursing-short term rehab (<3 hours/day)  ?  ?Assistance Recommended at Discharge    ?Patient can return home with the following ? A little help  with walking and/or transfers;A little help with bathing/dressing/bathroom;Assistance with cooking/housework;Help with stairs or ramp for entrance ?  ?Equipment Recommendations ? None recommended by OT  ?  ?Recommendations for Other Services Other (comment) (psych consult?) ? ?  ?Precautions / Restrictions Precautions ?Precautions: Fall;Posterior Hip ?Precaution Booklet Issued: Yes (comment) ?Restrictions ?Weight Bearing Restrictions: Yes ?RLE Weight Bearing: Weight bearing as tolerated  ? ? ?  ? ?Mobility Bed Mobility ?  ?  ?  ?  ?  ?  ?  ?General bed mobility comments: NT, pt in recliner ?  ? ?Transfers ?Overall transfer level: Needs assistance ?Equipment used: Rolling walker (2 wheels) ?Transfers: Sit to/from Stand, Bed to chair/wheelchair/BSC ?Sit to Stand: Min guard ?  ?  ?  ?  ?  ?General transfer comment: Verbal and tactile cueing, but pt able to transfer sit<>stand without any physical assist ?  ?  ?Balance Overall balance assessment: Needs assistance ?Sitting-balance support: Feet supported, Bilateral upper extremity supported ?Sitting balance-Leahy Scale: Good ?  ?  ?Standing balance support: Bilateral upper extremity supported, During functional activity, Reliant on assistive device for balance ?Standing balance-Leahy Scale: Fair ?  ?  ?  ?  ?  ?  ?  ?  ?  ?  ?  ?  ?   ? ?ADL either performed or assessed with clinical judgement  ? ?ADL Overall ADL's : Needs assistance/impaired ?  ?  ?  ?  ?  ?  ?  ?  ?  ?  ?Lower Body Dressing: Moderate assistance ?  ?Toilet Transfer: Min guard ?  ?  ?  ?  ?  ?Functional  mobility during ADLs: Rolling walker (2 wheels);Min guard ?  ?  ? ?Extremity/Trunk Assessment Upper Extremity Assessment ?Upper Extremity Assessment: Overall WFL for tasks assessed ?  ?Lower Extremity Assessment ?Lower Extremity Assessment: Defer to PT evaluation ?RLE Deficits / Details: BLE ankle AROM, strength, and sensation to light touch grossly intact ?RLE Sensation: WNL ?  ?Cervical / Trunk  Assessment ?Cervical / Trunk Assessment: Normal ?  ? ?Vision Patient Visual Report: No change from baseline ?  ?  ?Perception   ?  ?Praxis   ?  ? ?Cognition Arousal/Alertness: Awake/alert ?Behavior During Therapy: Impulsive, Anxious ?Overall Cognitive Status: Impaired/Different from baseline ?  ?  ?  ?  ?  ?  ?  ?  ?  ?  ?  ?Memory: Decreased recall of precautions ?  ?Safety/Judgement: Decreased awareness of deficits ?  ?  ?General Comments: Pt oriented to self, location, place, date; but nevertheless presents at confused; Tangential conversation, limited awareness of situation, deficits ?  ?  ?   ?Exercises Other Exercises ?Other Exercises: Posterior hip precaution education vebally, during functional tasks, and with review of handouts ?Other Exercises: Car transfer sequencing education ? ?  ?Shoulder Instructions   ? ? ?  ?General Comments    ? ? ?Pertinent Vitals/ Pain       Pain Assessment ?Pain Score: 4  ?Pain Location: R hip ?Pain Descriptors / Indicators: Sore ?Pain Intervention(s): Repositioned ? ?Home Living Family/patient expects to be discharged to:: Private residence ?  ?  ?  ?  ?  ?  ?  ?  ?  ?  ?  ?  ?  ?  ?  ?  ?  ?  ? ?  ?Prior Functioning/Environment    ?  ?  ?  ?   ? ?Frequency ? Min 2X/week  ? ? ? ? ?  ?Progress Toward Goals ? ?OT Goals(current goals can now be found in the care plan section) ? Progress towards OT goals: Progressing toward goals ? ?Acute Rehab OT Goals ?Patient Stated Goal: to go home ?OT Goal Formulation: With patient ?Time For Goal Achievement: 12/05/21 ?Potential to Achieve Goals: Good  ?Plan Discharge plan remains appropriate;Frequency remains appropriate   ? ?Co-evaluation ? ? ?   ?  ?  ?  ?  ? ?  ?AM-PAC OT "6 Clicks" Daily Activity     ?Outcome Measure ? ? Help from another person eating meals?: None ?Help from another person taking care of personal grooming?: None ?Help from another person toileting, which includes using toliet, bedpan, or urinal?: A Little ?Help from  another person bathing (including washing, rinsing, drying)?: A Little ?Help from another person to put on and taking off regular upper body clothing?: None ?Help from another person to put on and taking off regular lower body clothing?: A Lot ?6 Click Score: 20 ? ?  ?End of Session Equipment Utilized During Treatment: Rolling walker (2 wheels) ? ?OT Visit Diagnosis: Other abnormalities of gait and mobility (R26.89);Pain;Muscle weakness (generalized) (M62.81) ?Pain - Right/Left: Right ?Pain - part of body: Hip ?  ?Activity Tolerance Patient tolerated treatment well ?  ?Patient Left in chair;with call bell/phone within reach;with chair alarm set;with nursing/sitter in room ?  ?Nurse Communication   ?  ? ?   ? ?Time: 2130-8657 ?OT Time Calculation (min): 20 min ? ?Charges: OT General Charges ?$OT Visit: 1 Visit ?OT Treatments ?$Self Care/Home Management : 8-22 mins ? ?Josiah Lobo, PhD, MS, OTR/L ?11/23/21, 1:52 PM ? ?

## 2021-11-23 NOTE — Progress Notes (Signed)
Iv fluids to be given during shift for hydration pt refusing all meds, fluids, pt states " I am not having those fluids until Dr. Harlow Mares talks to me tomorrow". Discussed with pt and friend at bedside that Dr. Harlow Mares was in here earlier and discussed the importance of IV rehydration he still refused with tech present.   ?

## 2021-11-23 NOTE — Progress Notes (Signed)
Physical Therapy Treatment ?Patient Details ?Name: [name] ?MRN: [id number] ?DOB: [birth year] ?Today's Date: 11/23/2021 ? ? 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  PMH includes COPD, CAD, and MI. 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  PMH includes COPD, CAD, and MI. ? ?  ?PT Comments  ? ? Pt declined to perform any mobility task this session but agreed to verbal education/demonstration. Pt unable to clearly articulate why he was refusing to participate with PT services but perseverated on issues with IV line placement with nursing.  Pt stated that he has been speaking with staff regarding leaving AMA with pt education provided on benefits of continued skilled care at this time.  Extensive education/review of posterior hip precautions including proper sequencing with functional tasks provided. Pt and sister also educated on proper sequencing with ascending/descending stairs including visual demonstration.  Pt will benefit from PT services in a SNF setting upon discharge to safely address deficits listed in patient problem list for decreased caregiver assistance and eventual return to PLOF. ?       ?Recommendations for follow up therapy are one component of a multi-disciplinary discharge planning process, led by the attending physician.  Recommendations may be updated based on patient status, additional functional criteria and insurance authorization. ? ?Follow Up Recommendations ? Skilled nursing-short term rehab (<3 hours/day) ?  ?  ?Assistance Recommended at Discharge Frequent or constant Supervision/Assistance  ?Patient can return home with the following Assistance with cooking/housework;Direct supervision/assist for medications management;Assist for transportation;Help with  stairs or ramp for entrance;A lot of help with walking and/or transfers;A little help with bathing/dressing/bathroom ?  ?Equipment Recommendations ? None recommended by PT  ?  ?Recommendations for Other Services   ? ? ?  ?Precautions / Restrictions Precautions ?Precautions: Fall;Posterior Hip ?Precaution Booklet Issued: Yes (comment) ?Restrictions ?Weight Bearing Restrictions: Yes ?RLE Weight Bearing: Weight bearing as tolerated  ?  ? ?Mobility ? Bed Mobility ?  ?  ?  ?  ?  ?  ?  ?General bed mobility comments: Pt declined all but education this session ?  ? ?Transfers ?Overall transfer level: Needs assistance ?Equipment used: Rolling walker (2 wheels) ?Transfers: Sit to/from Stand ?Sit to Stand: From elevated surface, Min assist ?  ?  ?  ?  ?  ?General transfer comment: Mod verbal and tactile cues for sequencing and min A to come to full upright standing, heavy use of BUEs to assist ?  ? ?Ambulation/Gait ?Ambulation/Gait assistance: Min guard, Min assist ?Gait Distance (Feet): 40 Feet ?Assistive device: Rolling walker (2 wheels) ?Gait Pattern/deviations: Step-to pattern, Trunk flexed, Decreased stance time - right, Decreased step length - left ?Gait velocity: decreased ?  ?  ?General Gait Details: Max verbal and tactile cues for proper sequencing to prevent R hip IR during sharp right turns; pt required min A to physically ER his R hip during training secondary to poor ability to follow commands/poor carryover ? ? ?Stairs ?  ?  ?  ?  ?  ? ? ?Wheelchair Mobility ?  ? ?Modified Rankin (Stroke Patients Only) ?  ? ? ?  ?Balance Overall balance assessment: Needs assistance ?  ?Sitting balance-Leahy Scale: Good ?  ?  ?Standing balance support: Bilateral upper extremity supported, During functional activity, Reliant on  assistive device for balance ?Standing balance-Leahy Scale: Poor ?  ?  ?  ?  ?  ?  ?  ?  ?  ?  ?  ?  ?  ? ?  ?Cognition Arousal/Alertness: Awake/alert ?Behavior During Therapy: Impulsive, Anxious ?Overall  Cognitive Status: Impaired/Different from baseline ?  ?  ?  ?  ?  ?  ?  ?  ?  ?  ?  ?  ?  ?  ?  ?  ?  ?  ?  ? ?  ?Exercises Total Joint Exercises ?Long Arc Quad: AROM, Strengthening, Both, 10 reps ?Knee Flexion: AROM, Strengthening, Both, 10 reps ?Other Exercises ?Other Exercises: Posterior hip precaution education vebally and visully this session ?Other Exercises: Car transfer sequencing education/review ?Other Exercises: Stair training sequencing education provided verbally and with visual demonstration for patient's sister/caregiver ? ?  ?General Comments   ?  ?  ? ?Pertinent Vitals/Pain Pain Assessment ?Pain Assessment: 0-10 ?Pain Score: 4  ?Pain Location: R hip ?Pain Descriptors / Indicators: Sore ?Pain Intervention(s): Premedicated before session, Monitored during session  ? ? ?Home Living Family/patient expects to be discharged to:: Private residence ?  ?  ?  ?  ?  ?  ?  ?  ?  ?   ?  ?Prior Function    ?  ?  ?   ? ?PT Goals (current goals can now be found in the care plan section) Progress towards PT goals: PT to reassess next treatment ? ?  ?Frequency ? ? ? BID ? ? ? ?  ?PT Plan Current plan remains appropriate  ? ? ?Co-evaluation   ?  ?  ?  ?  ? ?  ?AM-PAC PT "6 Clicks" Mobility   ?Outcome Measure ? Help needed turning from your back to your side while in a flat bed without using bedrails?: A Lot ?Help needed moving from lying on your back to sitting on the side of a flat bed without using bedrails?: A Lot ?Help needed moving to and from a bed to a chair (including a wheelchair)?: A Little ?Help needed standing up from a chair using your arms (e.g., wheelchair or bedside chair)?: A Little ?Help needed to walk in hospital room?: A Little ?Help needed climbing 3-5 steps with a railing? : A Lot ?6 Click Score: 15 ? ?  ?End of Session   ?Activity Tolerance: Patient tolerated treatment well ?Patient left: in chair;with chair alarm set;with call bell/phone within reach;with family/visitor present ?Nurse  Communication: Mobility status;Weight bearing status;Precautions ?PT Visit Diagnosis: Other abnormalities of gait and mobility (R26.89);Muscle weakness (generalized) (M62.81);Pain ?Pain - Right/Left: Right ?Pain - part of body: Hip ?  ? ? ?Time: 0174-9449 ?PT Time Calculation (min) (ACUTE ONLY): 24 min ? ?Charges:  $Therapeutic Activity: 23-37 mins          ?          ?D. Royetta Asal PT, DPT ?11/23/21, 3:57 PM ? ? ? ?

## 2021-11-23 NOTE — Progress Notes (Signed)
Physical Therapy Treatment ?Patient Details ?Name: [name] ?MRN: [id number] ?DOB: 08-Nov-1955 ?Today's Date: 11/23/2021 ? ? 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  PMH includes COPD, CAD, and MI. 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  PMH includes COPD, CAD, and MI. ? ?  ?PT Comments  ? ? Pt made some progress towards goals today but continued to demonstrate very poor carryover of proper sequencing for hip precaution compliance.  Even with max verbal and tactile cues pt unable to coordinate proper sequencing to avoid right hip IR during turns.  Therapist was required to physically ER the pt's R hip to assist with proper sequencing.  Pt was able to amb a max of 40 feet this session but with very slow, antalgic, effortful step-to pattern with heavy lean on the RW for support.  Pt will benefit from PT services in a SNF setting upon discharge to safely address deficits listed in patient problem list for decreased caregiver assistance and eventual return to PLOF. ?  ?Recommendations for follow up therapy are one component of a multi-disciplinary discharge planning process, led by the attending physician.  Recommendations may be updated based on patient status, additional functional criteria and insurance authorization. ? ?Follow Up Recommendations ? Skilled nursing-short term rehab (<3 hours/day) ?  ?  ?Assistance Recommended at Discharge Frequent or constant Supervision/Assistance  ?Patient can return home with the following Assistance with cooking/housework;Direct supervision/assist for medications management;Assist for transportation;Help with stairs or ramp for entrance;A lot of help with walking and/or transfers;A little help with bathing/dressing/bathroom ?  ?Equipment  Recommendations ? None recommended by PT  ?  ?Recommendations for Other Services   ? ? ?  ?Precautions / Restrictions Precautions ?Precautions: Fall;Posterior Hip ?Precaution Booklet Issued: Yes (comment) ?Restrictions ?Weight Bearing Restrictions: Yes ?RLE Weight Bearing: Weight bearing as tolerated  ?  ? ?Mobility ? Bed Mobility ?  ?  ?  ?  ?  ?  ?  ?General bed mobility comments: NT, pt in recliner ?  ? ?Transfers ?Overall transfer level: Needs assistance ?Equipment used: Rolling walker (2 wheels) ?Transfers: Sit to/from Stand ?Sit to Stand: From elevated surface, Min assist ?  ?  ?  ?  ?  ?General transfer comment: Mod verbal and tactile cues for sequencing and min A to come to full upright standing, heavy use of BUEs to assist ?  ? ?Ambulation/Gait ?Ambulation/Gait assistance: Min guard, Min assist ?Gait Distance (Feet): 40 Feet x 1, 15 Feet x 1 ?Assistive device: Rolling walker (2 wheels) ?Gait Pattern/deviations: Step-to pattern, Trunk flexed, Decreased stance time - right, Decreased step length - left ?Gait velocity: decreased ?  ?  ?General Gait Details: Max verbal and tactile cues for proper sequencing to prevent R hip IR during sharp right turns; pt required min A to physically ER his R hip during training secondary to poor ability to follow commands/poor carryover ? ? ?Stairs ?  ?  ?  ?  ?  ? ? ?Wheelchair Mobility ?  ? ?Modified Rankin (Stroke Patients Only) ?  ? ? ?  ?Balance Overall balance assessment: Needs assistance ?  ?Sitting balance-Leahy Scale: Good ?  ?  ?Standing balance support: Bilateral upper extremity supported, During functional activity, Reliant on assistive device for balance ?Standing balance-Leahy Scale:  Poor ?  ?  ?  ?  ?  ?  ?  ?  ?  ?  ?  ?  ?  ? ?  ?Cognition Arousal/Alertness: Awake/alert ?Behavior During Therapy: Impulsive ?Overall Cognitive Status: Impaired/Different from baseline ?  ?  ?  ?  ?  ?  ?  ?  ?  ?  ?  ?  ?  ?  ?  ?  ?  ?  ?  ? ?  ?Exercises Total Joint  Exercises ?Long Arc Quad: AROM, Strengthening, Both, 10 reps ?Knee Flexion: AROM, Strengthening, Both, 10 reps ?Other Exercises ?Other Exercises: Posterior hip precaution education vebally, during functional tasks, and with review of handouts ? ?  ?General Comments   ?  ?  ? ?Pertinent Vitals/Pain Pain Assessment ?Pain Assessment: 0-10 ?Pain Score: 4  ?Pain Location: R hip ?Pain Descriptors / Indicators: Sore ?Pain Intervention(s): Repositioned, Premedicated before session, Monitored during session  ? ? ?Home Living   ?  ?  ?  ?  ?  ?  ?  ?  ?  ?   ?  ?Prior Function    ?  ?  ?   ? ?PT Goals (current goals can now be found in the care plan section) Progress towards PT goals: Progressing toward goals ? ?  ?Frequency ? ? ? BID ? ? ? ?  ?PT Plan Current plan remains appropriate  ? ? ?Co-evaluation   ?  ?  ?  ?  ? ?  ?AM-PAC PT "6 Clicks" Mobility   ?Outcome Measure ? Help needed turning from your back to your side while in a flat bed without using bedrails?: A Lot ?Help needed moving from lying on your back to sitting on the side of a flat bed without using bedrails?: A Lot ?Help needed moving to and from a bed to a chair (including a wheelchair)?: A Little ?Help needed standing up from a chair using your arms (e.g., wheelchair or bedside chair)?: A Little ?Help needed to walk in hospital room?: A Little ?Help needed climbing 3-5 steps with a railing? : A Lot ?6 Click Score: 15 ? ?  ?End of Session Equipment Utilized During Treatment: Gait belt ?Activity Tolerance: Patient tolerated treatment well ?Patient left: with call bell/phone within reach;with family/visitor present;in chair;with chair alarm set ?Nurse Communication: Mobility status;Weight bearing status;Precautions ?PT Visit Diagnosis: Other abnormalities of gait and mobility (R26.89);Muscle weakness (generalized) (M62.81);Pain ?Pain - Right/Left: Right ?Pain - part of body: Hip ?  ? ? ?Time: 9622-2979 ?PT Time Calculation (min) (ACUTE ONLY): 29 min ? ?Charges:   $Gait Training: 8-22 mins ?$Therapeutic Activity: 8-22 mins          ?          ?D. Royetta Asal PT, DPT ?11/23/21, 11:13 AM ? ? ?

## 2021-11-23 NOTE — Assessment & Plan Note (Signed)
-   Presumed mild ?- Diuresis with ethacrynic acid ?- Strict I's and O's ?

## 2021-11-23 NOTE — Progress Notes (Signed)
Pt refusing IV beta blocker and oral beta blocker. Pt refused all tests and medications MD aware. Pt refused afternoon PT. MD aware pt told nurse and MD that he is going home today, MD notified MD.  ?

## 2021-11-24 MED ORDER — DOCUSATE SODIUM 100 MG PO CAPS: 100.0000 mg | ORAL_CAPSULE | Freq: Two times a day (BID) | ORAL | 0 refills | Status: DC

## 2021-11-24 MED ORDER — OXYCODONE HCL 5 MG PO TABS: 5.0000 mg | ORAL_TABLET | ORAL | 0 refills | Status: DC | PRN

## 2021-11-24 MED ORDER — ASPIRIN 81 MG PO CHEW
81.0000 mg | CHEWABLE_TABLET | Freq: Two times a day (BID) | ORAL | 0 refills | Status: DC
Start: 2021-11-24 — End: 2022-02-20

## 2021-11-24 NOTE — Progress Notes (Signed)
Pt. Has refused all medications and help tonight. He presented agitated and combative around 8:15 pm. Pt called the  police department multiple times to take him out of the hospital. Security was contacted and came to talk to pt. Pts sister (primary contact) was contacted, and is aware of the situation. ? ?Pt would not allow vital signs to be taken stated "I don't want to be touched". ? ?Brenda Morrison, NP was contacted by ortho on call, went to bed side with the patient and met his comfort needs. After talking to her, pt agreed to stay in bed for the night. ? ?0045: pt is now calm and cooperative. Still refuses any medication. ? ?0210: Pt called to ambulate to the bathroom still refusing any medication even with complaints of pain. Pt states "Im done with this place" ? ?0512: Pt refused blood work. Phlebotomist said she will try again at 0800. ? ?0636: Pt says he is thirsty but will not drink any water he said "he would have a can of soda as long as he was able to open it to make sure there were no drugs in it". ? ?

## 2021-11-24 NOTE — Progress Notes (Signed)
Physical Therapy Treatment ?Patient Details ?Name: [name] ?MRN: [id number] ?DOB: 06/25/1956 ?Today's Date: 11/24/2021 ? ? 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  PMH includes COPD, CAD, and MI. 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  PMH includes COPD, CAD, and MI. ? ?  ?PT Comments  ? ? Pt is making gradual progress towards goals with ability to ambulate further distance in hallway. Pt continues to perseverate on non related topics (blood draw, IV use, placement of personal items) and takes extended time to pay attention to task. RW used for sequencing, however poor awareness of hip precautions. Pt self limits mobility and reports he is unable to further perform there-ex. Increased SOB symptoms noted. Will continue to progress as able.  ?Recommendations for follow up therapy are one component of a multi-disciplinary discharge planning process, led by the attending physician.  Recommendations may be updated based on patient status, additional functional criteria and insurance authorization. ? ?Follow Up Recommendations ? Skilled nursing-short term rehab (<3 hours/day) ?  ?  ?Assistance Recommended at Discharge Intermittent Supervision/Assistance  ?Patient can return home with the following A little help with walking and/or transfers;A little help with bathing/dressing/bathroom;Assistance with cooking/housework;Assist for transportation;Help with stairs or ramp for entrance ?  ?Equipment Recommendations ? None recommended by PT  ?  ?Recommendations for Other Services   ? ? ?  ?Precautions / Restrictions Precautions ?Precautions: Fall;Posterior Hip ?Precaution Booklet Issued: Yes (comment) ?Restrictions ?Weight Bearing Restrictions: Yes ?RLE Weight Bearing: Weight bearing as  tolerated  ?  ? ?Mobility ? Bed Mobility ?  ?  ?  ?  ?  ?  ?  ?General bed mobility comments: received seated at EOB, not performed ?  ? ?Transfers ?Overall transfer level: Needs assistance ?Equipment used: Rolling walker (2 wheels) ?Transfers: Sit to/from Stand ?Sit to Stand: Min assist ?  ?  ?  ?  ?  ?General transfer comment: needs cues for sequencing. Unable to perform without assist. Cues for hip precautions. Once standing, upright posture noted ?  ? ?Ambulation/Gait ?Ambulation/Gait assistance: Min guard ?Gait Distance (Feet): 75 Feet ?Assistive device: Rolling walker (2 wheels) ?Gait Pattern/deviations: Step-to pattern, Trunk flexed, Decreased stance time - right, Decreased step length - left ?  ?  ?  ?General Gait Details: slow gait speed with forward flexed posture. RW used and heavy cues for hip precautions, tends to IR on surgical leg. ? ? ?Stairs ?  ?  ?  ?  ?  ? ? ?Wheelchair Mobility ?  ? ?Modified Rankin (Stroke Patients Only) ?  ? ? ?  ?Balance Overall balance assessment: Needs assistance ?Sitting-balance support: Feet supported, Bilateral upper extremity supported ?Sitting balance-Leahy Scale: Good ?  ?  ?Standing balance support: Bilateral upper extremity supported, During functional activity, Reliant on assistive device for balance ?Standing balance-Leahy Scale: Fair ?  ?  ?  ?  ?  ?  ?  ?  ?  ?  ?  ?  ?  ? ?  ?Cognition Arousal/Alertness: Awake/alert ?Behavior During Therapy: Impulsive, Anxious ?Overall Cognitive Status: Impaired/Different from baseline ?  ?  ?  ?  ?  ?  ?  ?  ?  ?  ?  ?  ?  ?  ?  ?  ?General  Comments: oriented x 4, however still very tangential conversation and asks in appropriate questions about unrelated items. Very particular, however agreeable to limited PT session ?  ?  ? ?  ?Exercises   ? ?  ?General Comments   ?  ?  ? ?Pertinent Vitals/Pain Pain Assessment ?Pain Assessment: 0-10 ?Pain Score: 6  ?Pain Location: R hip ?Pain Descriptors / Indicators: Sore ?Pain  Intervention(s): Limited activity within patient's tolerance, Repositioned  ? ? ?Home Living   ?  ?  ?  ?  ?  ?  ?  ?  ?  ?   ?  ?Prior Function    ?  ?  ?   ? ?PT Goals (current goals can now be found in the care plan section) Acute Rehab PT Goals ?Patient Stated Goal: To be able to work in the yard ?PT Goal Formulation: With patient ?Time For Goal Achievement: 12/04/21 ?Potential to Achieve Goals: Good ?Progress towards PT goals: Progressing toward goals ? ?  ?Frequency ? ? ? BID ? ? ? ?  ?PT Plan Current plan remains appropriate  ? ? ?Co-evaluation   ?  ?  ?  ?  ? ?  ?AM-PAC PT "6 Clicks" Mobility   ?Outcome Measure ? Help needed turning from your back to your side while in a flat bed without using bedrails?: A Lot ?Help needed moving from lying on your back to sitting on the side of a flat bed without using bedrails?: A Lot ?Help needed moving to and from a bed to a chair (including a wheelchair)?: A Little ?Help needed standing up from a chair using your arms (e.g., wheelchair or bedside chair)?: A Little ?Help needed to walk in hospital room?: A Little ?Help needed climbing 3-5 steps with a railing? : A Lot ?6 Click Score: 15 ? ?  ?End of Session Equipment Utilized During Treatment: Gait belt ?Activity Tolerance: Patient tolerated treatment well ?Patient left: in chair;with chair alarm set ?Nurse Communication: Mobility status;Weight bearing status;Precautions ?PT Visit Diagnosis: Other abnormalities of gait and mobility (R26.89);Muscle weakness (generalized) (M62.81);Pain ?Pain - Right/Left: Right ?Pain - part of body: Hip ?  ? ? ?Time: 2458-0998 ?PT Time Calculation (min) (ACUTE ONLY): 23 min ? ?Charges:  $Gait Training: 23-37 mins          ?          ? ?Greggory Stallion, PT, DPT, GCS ?[phone] ? ? ? ?Lacie Landry ?11/24/2021, 12:17 PM ? ?

## 2021-11-24 NOTE — Progress Notes (Signed)
?Subjective: ? ?Patient reports pain as mild.   ? ?Objective:  ? ?VITALS:   ?Vitals:  ? 11/23/21 0700 11/23/21 0915 11/23/21 1106 11/23/21 1609  ?BP: (!) 125/53 130/60 (!) 141/45 (!) 118/51  ?Pulse: (!) 113 (!) 115 (!) 123 (!) 121  ?Resp: '17 18 18 18  '$ ?Temp: 98.4 ?F (36.9 ?C) 98.5 ?F (36.9 ?C) 98.7 ?F (37.1 ?C) 99 ?F (37.2 ?C)  ?TempSrc: Oral Oral Oral   ?SpO2: 96%   95%  ?Weight:      ?Height:      ? ? ?PHYSICAL EXAM: ? ?Neurologically intact ?ABD soft ?Neurovascular intact ?Sensation intact distally ?Intact pulses distally ?Dorsiflexion/Plantar flexion intact ?Incision: dressing C/D/I ?No cellulitis present ?Compartment soft ? ?LABS ? ?No results found for this or any previous visit (from the past 24 hour(s)). ? ?CT HEAD WO CONTRAST (5MM) ? ?Result Date: 11/22/2021 ?CLINICAL DATA:  Altered mental status. EXAM: CT HEAD WITHOUT CONTRAST TECHNIQUE: Contiguous axial images were obtained from the base of the skull through the vertex without intravenous contrast. RADIATION DOSE REDUCTION: This exam was performed according to the departmental dose-optimization program which includes automated exposure control, adjustment of the mA and/or kV according to patient size and/or use of iterative reconstruction technique. COMPARISON:  None available. FINDINGS: Brain: No intracranial hemorrhage, mass effect, or midline shift. No hydrocephalus. The basilar cisterns are patent. No evidence of territorial infarct or acute ischemia. Normal brain volume for age. No extra-axial or intracranial fluid collection. Vascular: Atherosclerosis of skullbase vasculature without hyperdense vessel or abnormal calcification. Skull: No fracture or focal lesion. Sinuses/Orbits: Minimal opacification of lower left mastoid air cells. Scattered mucosal thickening of ethmoid air cells. No sinus fluid levels. Unremarkable orbits Other: None. IMPRESSION: No acute intracranial abnormality. Electronically Signed   By: Keith Rake M.D.   On: 11/22/2021  20:33  ? ?US Venous Img Lower Bilateral (DVT) ? ?Result Date: 11/23/2021 ?CLINICAL DATA:  Leg swelling EXAM: BILATERAL LOWER EXTREMITY VENOUS DOPPLER ULTRASOUND TECHNIQUE: Gray-scale sonography with compression, as well as color and duplex ultrasound, were performed to evaluate the deep venous system(s) from the level of the common femoral vein through the popliteal and proximal calf veins. COMPARISON:  None FINDINGS: VENOUS Normal compressibility of BILATERAL common femoral, superficial femoral, and popliteal veins, as well as the visualized calf veins. Visualized portions of BILATERAL profunda femoral vein and great saphenous vein unremarkable. No filling defects to suggest DVT on grayscale or color Doppler imaging. Doppler waveforms show normal direction of venous flow, normal respiratory plasticity and response to augmentation. OTHER None. Limitations: none IMPRESSION: No evidence of deep venous thrombosis in either lower extremity. Electronically Signed   By: Lavonia Dana M.D.   On: 11/23/2021 08:56  ? ?DG Chest Port 1 View ? ?Result Date: 11/22/2021 ?CLINICAL DATA:  Shortness of breath. EXAM: PORTABLE CHEST 1 VIEW COMPARISON:  05/01/2017, chest CT 10/16/2017 FINDINGS: Upper normal heart size. Normal mediastinal contours with aortic atherosclerosis. There is diffuse peribronchial thickening. No confluent consolidation. No pneumothorax or large pleural effusion. Emphysematous changes were better demonstrated on prior CT IMPRESSION: 1. Diffuse peribronchial thickening, may be bronchial inflammation or pulmonary edema. 2. Upper normal heart size. 3. Emphysematous changes were better demonstrated on prior CT. Electronically Signed   By: Keith Rake M.D.   On: 11/22/2021 20:02   ? ?Assessment/Plan: ?3 Days Post-Op  ? ?Principal Problem: ?  History of revision of total replacement of right hip joint ?Active Problems: ?  Anemia ?  Thrombocytopenia ([place]) ?  Tachycardia,  paroxysmal ([place]) ?  Elevated serum creatinine ?   Elevated troponin ?  Pulmonary edema ?  Positive D dimer ?  AKI (acute kidney injury) ([place]) ? ? ?Advance diet ?Up with therapy ?Slow with PT ?Consider SNF ? ? ?[name] , PA-C ?11/24/2021, 6:29 AM ? ? ? ? ?  ?

## 2021-11-24 NOTE — Care Management Important Message (Signed)
Important Message ? ?Patient Details  ?Name: [name] ?MRN: [id number] ?Date of Birth: 1955-11-21 ? ? ?Medicare Important Message Given:  Yes ? ? ? ? ?Juliann Pulse A [name] ?11/24/2021, 11:33 AM ?

## 2021-11-24 NOTE — Progress Notes (Signed)
Patient alert and oriented to himself and place. Remains confused about situation and time, reoriented patient to situation educating him about hip surgery and importance of working with PT and continuing to mobilize. Patient allowed this RN to restart IV fluids @ 75 ml/hr. VSS, HR elevated. R hip drsg CDI. Refusing other medications at this time despite education. Promoting oral intake. Consulting medical MD Patel aware. Bed alarm active and audible. Call bell and personal belongings within reach.  ?

## 2021-11-24 NOTE — Plan of Care (Signed)
  Problem: Education: Goal: Knowledge of General Education information will improve Description: Including pain rating scale, medication(s)/side effects and non-pharmacologic comfort measures Outcome: Progressing   Problem: Health Behavior/Discharge Planning: Goal: Ability to manage health-related needs will improve Outcome: Progressing   Problem: Clinical Measurements: Goal: Ability to maintain clinical measurements within normal limits will improve Outcome: Progressing Goal: Will remain free from infection Outcome: Progressing Goal: Diagnostic test results will improve Outcome: Progressing Goal: Respiratory complications will improve Outcome: Progressing Goal: Cardiovascular complication will be avoided Outcome: Progressing   Problem: Activity: Goal: Risk for activity intolerance will decrease Outcome: Progressing   Problem: Nutrition: Goal: Adequate nutrition will be maintained Outcome: Progressing   Problem: Coping: Goal: Level of anxiety will decrease Outcome: Progressing   Problem: Elimination: Goal: Will not experience complications related to urinary retention Outcome: Progressing   Problem: Pain Managment: Goal: General experience of comfort will improve Outcome: Progressing   Problem: Skin Integrity: Goal: Risk for impaired skin integrity will decrease Outcome: Progressing   

## 2021-11-24 NOTE — Discharge Summary (Signed)
Physician Discharge Summary  ?Patient ID: ?[name] ?MRN: [id number] ?DOB/AGE: [birth year] 66 y.o. ? ?Admit date: 11/21/2021 ?Discharge date: 11/24/2021 ? ?Admission Diagnoses:  ?M24.859 Oth specific joint derangements of unsp hip, NEC ?History of revision of total replacement of right hip joint ? ?Discharge Diagnoses:  ?M24.859 Oth specific joint derangements of unsp hip, NEC ?Principal Problem: ?  History of revision of total replacement of right hip joint ?Active Problems: ?  Anemia ?  Thrombocytopenia ([place]) ?  Tachycardia, paroxysmal ([place]) ?  Elevated serum creatinine ?  Elevated troponin ?  Pulmonary edema ?  Positive D dimer ?  AKI (acute kidney injury) ([place]) ? ? ?Past Medical History:  ?Diagnosis Date  ? Acute lower GI hemorrhage   ? Anemia   ? Anxiety   ? ARF (acute renal failure) ([place])   ? Arthritis   ? hips  ? Avascular necrosis of bone ([place])   ? Cirrhosis of liver ([place])   ? COPD (chronic obstructive pulmonary disease) ([place])   ? no inhalers  ? Depression   ? Diverticulitis   ? with large cyst- colon surgery x3  ? GERD (gastroesophageal reflux disease)   ? Headache(784.0)   ? hx of cluster migraines  ? Lactic acidosis   ? MDS (myelodysplastic syndrome), low grade ([place])   ? Myocardial infarction [facility]) 2019  ? no stents  ? Suicidal ideation   ? Thrombocytopenia ([place])   ? ? ?Surgeries: Procedure(s): ?TOTAL HIP REVISION on 11/21/2021 ?  ?Consultants (if any):  ? ?Discharged Condition: Improved ? ?Hospital Course: [name] is an 66 y.o. male who was admitted 11/21/2021 with a diagnosis of  M24.859 Oth specific joint derangements of unsp hip, NEC History of revision of total replacement of right hip joint and went to the operating room on 11/21/2021 and underwent the above named procedures.   ? ?He was given perioperative antibiotics:  ?Anti-infectives (From admission, onward)  ? ? Start     Dose/Rate Route Frequency Ordered Stop  ? 11/22/21 2100  azithromycin (ZITHROMAX) 500 mg in sodium chloride 0.9 % 250 mL  IVPB  Status:  Discontinued       ? 500 mg ?250 mL/hr over 60 Minutes Intravenous Every 24 hours 11/22/21 2008 11/23/21 1356  ? 11/22/21 2100  cefTRIAXone (ROCEPHIN) 2 g in sodium chloride 0.9 % 100 mL IVPB  Status:  Discontinued       ? 2 g ?200 mL/hr over 30 Minutes Intravenous Every 24 hours 11/22/21 2008 11/23/21 1356  ? 11/21/21 1700  ceFAZolin (ANCEF) IVPB 2g/100 mL premix       ? 2 g ?200 mL/hr over 30 Minutes Intravenous Every 6 hours 11/21/21 1501 11/22/21 0015  ? 11/21/21 0826  ceFAZolin (ANCEF) 2-4 GM/100ML-% IVPB       ?Note to Pharmacy: Herby Abraham W: cabinet override  ?    11/21/21 0826 11/21/21 1037  ? 11/21/21 0600  ceFAZolin (ANCEF) IVPB 2g/100 mL premix       ? 2 g ?200 mL/hr over 30 Minutes Intravenous On call to O.R. 11/21/21 0145 11/21/21 1031  ? ?  ?. ? ?He was given sequential compression devices, early ambulation, and aspirin for DVT prophylaxis. ? ?He benefited maximally from the hospital stay and there were no complications.   ? ?Recent vital signs:  ?Vitals:  ? 11/24/21 0733 11/24/21 1134  ?BP: 138/66 (!) 112/56  ?Pulse: (!) 113 (!) 110  ?Resp: 16 16  ?Temp: 98.1 ?F (36.7 ?C) 98.3 ?F (36.8 ?  C)  ?SpO2: 98% 99%  ? ? ?Recent laboratory studies:  ?Lab Results  ?Component Value Date  ? HGB 7.2 (L) 11/23/2021  ? HGB 7.8 (L) 11/22/2021  ? HGB 8.4 (L) 11/22/2021  ? ?Lab Results  ?Component Value Date  ? WBC 10.0 11/23/2021  ? PLT 74 (L) 11/23/2021  ? ?Lab Results  ?Component Value Date  ? INR 1.6 (H) 11/11/2021  ? ?Lab Results  ?Component Value Date  ? NA 139 11/23/2021  ? K 5.2 (H) 11/23/2021  ? CL 108 11/23/2021  ? CO2 23 11/23/2021  ? BUN 47 (H) 11/23/2021  ? CREATININE 3.15 (H) 11/23/2021  ? GLUCOSE 121 (H) 11/23/2021  ? ? ?Discharge Medications:   ?Allergies as of 11/24/2021   ? ?   Reactions  ? Triamterene-hctz Other (See Comments)  ? dizzy  ? Chantix [varenicline] Nausea And Vomiting  ? Codeine Itching, Nausea And Vomiting  ? If taken on empty stomach  ? Fluconazole Other (See Comments)   ? Blisters on lips  ? Lasix [furosemide] Other (See Comments)  ? Blisters on lips  ? Sulfa Antibiotics Other (See Comments)  ? CAUSED BLISTERS IN THE MOUTH  ? Tramadol Swelling  ? Wellbutrin [bupropion] Nausea And Vomiting  ? ?  ? ?  ?Medication List  ?  ? ?STOP taking these medications   ? ?HYDROcodone-acetaminophen 5-325 MG tablet ?Commonly known as: NORCO/VICODIN ?  ? ?  ? ?TAKE these medications   ? ?acetaminophen 325 MG tablet ?Commonly known as: TYLENOL ?Take 325-650 mg by mouth every 6 (six) hours as needed for moderate pain or headache. ?  ?ALPRAZolam 0.25 MG tablet ?Commonly known as: Duanne Moron ?Take 0.125-0.25 mg by mouth daily as needed for anxiety. ?  ?aspirin 81 MG chewable tablet ?Chew 1 tablet (81 mg total) by mouth 2 (two) times daily. ?  ?docusate sodium 100 MG capsule ?Commonly known as: COLACE ?Take 1 capsule (100 mg total) by mouth 2 (two) times daily. ?  ?ferrous sulfate 325 (65 FE) MG tablet ?Take 325 mg by mouth daily with breakfast. ?  ?LEG CRAMPS PO ?Take 1 tablet by mouth every morning. ?  ?mirtazapine 7.5 MG tablet ?Commonly known as: REMERON ?Take 7.5 mg by mouth at bedtime. ?  ?oxyCODONE 5 MG immediate release tablet ?Commonly known as: Oxy IR/ROXICODONE ?Take 1 tablet (5 mg total) by mouth every 4 (four) hours as needed for moderate pain (pain score 4-6). ?  ?PEPCID COMPLETE PO ?Take 1 tablet by mouth as needed. ?  ?temazepam 15 MG capsule ?Commonly known as: RESTORIL ?Take 15 mg by mouth at bedtime. ?  ? ?  ? ?  ?  ? ? ?  ?Durable Medical Equipment  ?(From admission, onward)  ?  ? ? ?  ? ?  Start     Ordered  ? 11/24/21 1330  For home use only DME 3 n 1  Once       ? 11/24/21 1331  ? 11/24/21 1330  For home use only DME Walker rolling  Once       ?Question Answer Comment  ?Walker: With 5 Inch Wheels   ?Patient needs a walker to treat with the following condition S/P total right hip arthroplasty   ?  ? 11/24/21 1331  ? 11/21/21 1502  DME Walker rolling  Once       ?Question:  Patient needs  a walker to treat with the following condition  Answer:  History of revision of total replacement of  right hip joint  ? 11/21/21 1501  ? 11/21/21 1502  DME 3 n 1  Once       ? 11/21/21 1501  ? 11/21/21 1502  DME Bedside commode  Once       ?Question:  Patient needs a bedside commode to treat with the following condition  Answer:  History of revision of total replacement of right hip joint  ? 11/21/21 1501  ? ?  ?  ? ?  ? ? ?Diagnostic Studies: CT HEAD WO CONTRAST (5MM) ? ?Result Date: 11/22/2021 ?CLINICAL DATA:  Altered mental status. EXAM: CT HEAD WITHOUT CONTRAST TECHNIQUE: Contiguous axial images were obtained from the base of the skull through the vertex without intravenous contrast. RADIATION DOSE REDUCTION: This exam was performed according to the departmental dose-optimization program which includes automated exposure control, adjustment of the mA and/or kV according to patient size and/or use of iterative reconstruction technique. COMPARISON:  None available. FINDINGS: Brain: No intracranial hemorrhage, mass effect, or midline shift. No hydrocephalus. The basilar cisterns are patent. No evidence of territorial infarct or acute ischemia. Normal brain volume for age. No extra-axial or intracranial fluid collection. Vascular: Atherosclerosis of skullbase vasculature without hyperdense vessel or abnormal calcification. Skull: No fracture or focal lesion. Sinuses/Orbits: Minimal opacification of lower left mastoid air cells. Scattered mucosal thickening of ethmoid air cells. No sinus fluid levels. Unremarkable orbits Other: None. IMPRESSION: No acute intracranial abnormality. Electronically Signed   By: Keith Rake M.D.   On: 11/22/2021 20:33  ? ?DG Pelvis Portable ? ?Result Date: 11/21/2021 ?CLINICAL DATA:  Postop. EXAM: PORTABLE PELVIS 1-2 VIEWS COMPARISON:  No prior hip imaging available. FINDINGS: Right hip arthroplasty in place. No periprosthetic fracture. Lobulated lucencies adjacent to the acetabular  component are typical of particle disease. Multiple surgical clips project over the intertrochanteric femur. Recent postsurgical change includes air and edema in the soft tissues. Lateral skin staples i

## 2021-11-24 NOTE — Discharge Instructions (Signed)

## 2021-11-24 NOTE — Plan of Care (Signed)
?  Problem: Clinical Measurements: ?Goal: Respiratory complications will improve ?Outcome: Progressing ?  ?Problem: Activity: ?Goal: Risk for activity intolerance will decrease ?Outcome: Progressing ?  ?Problem: Elimination: ?Goal: Will not experience complications related to urinary retention ?Outcome: Progressing ?  ?Problem: Skin Integrity: ?Goal: Risk for impaired skin integrity will decrease ?Outcome: Progressing ?  ?Problem: Education: ?Goal: Knowledge of General Education information will improve ?Description: Including pain rating scale, medication(s)/side effects and non-pharmacologic comfort measures ?Outcome: Not Progressing ?  ?Problem: Health Behavior/Discharge Planning: ?Goal: Ability to manage health-related needs will improve ?Outcome: Not Progressing ?  ?Problem: Clinical Measurements: ?Goal: Ability to maintain clinical measurements within normal limits will improve ?Outcome: Not Progressing ?Goal: Diagnostic test results will improve ?Outcome: Not Progressing ?Goal: Cardiovascular complication will be avoided ?Outcome: Not Progressing ?  ?Problem: Nutrition: ?Goal: Adequate nutrition will be maintained ?Outcome: Not Progressing ?  ?Problem: Coping: ?Goal: Level of anxiety will decrease ?Outcome: Not Progressing ?  ?Problem: Pain Managment: ?Goal: General experience of comfort will improve ?Outcome: Not Progressing ?  ?Problem: Elimination: ?Goal: Will not experience complications related to bowel motility ?Outcome: Completed/Met ?  ?Problem: Safety: ?Goal: Ability to remain free from injury will improve ?Outcome: Completed/Met ?  ?

## 2021-11-24 NOTE — Progress Notes (Signed)
Pt has been discharged by primary team.  Per rn refused IV fluids that were ordered yday. Refused labs and po meds this am. PT was able to work some with pt. ?Will sign off. Thanks ? ?No charge ?

## 2021-11-25 DIAGNOSIS — K7460 Unspecified cirrhosis of liver: Secondary | ICD-10-CM | POA: Diagnosis not present

## 2021-11-25 DIAGNOSIS — K219 Gastro-esophageal reflux disease without esophagitis: Secondary | ICD-10-CM | POA: Diagnosis not present

## 2021-11-25 DIAGNOSIS — M810 Age-related osteoporosis without current pathological fracture: Secondary | ICD-10-CM | POA: Diagnosis not present

## 2021-11-25 DIAGNOSIS — J449 Chronic obstructive pulmonary disease, unspecified: Secondary | ICD-10-CM | POA: Diagnosis not present

## 2021-11-25 DIAGNOSIS — F419 Anxiety disorder, unspecified: Secondary | ICD-10-CM | POA: Diagnosis not present

## 2021-11-25 DIAGNOSIS — I479 Paroxysmal tachycardia, unspecified: Secondary | ICD-10-CM | POA: Diagnosis not present

## 2021-11-25 DIAGNOSIS — K5790 Diverticulosis of intestine, part unspecified, without perforation or abscess without bleeding: Secondary | ICD-10-CM | POA: Diagnosis not present

## 2021-11-25 DIAGNOSIS — D469 Myelodysplastic syndrome, unspecified: Secondary | ICD-10-CM | POA: Diagnosis not present

## 2021-11-25 DIAGNOSIS — D696 Thrombocytopenia, unspecified: Secondary | ICD-10-CM | POA: Diagnosis not present

## 2021-11-25 DIAGNOSIS — T84030D Mechanical loosening of internal right hip prosthetic joint, subsequent encounter: Secondary | ICD-10-CM | POA: Diagnosis not present

## 2021-11-25 DIAGNOSIS — Z79891 Long term (current) use of opiate analgesic: Secondary | ICD-10-CM | POA: Diagnosis not present

## 2021-11-25 DIAGNOSIS — N179 Acute kidney failure, unspecified: Secondary | ICD-10-CM | POA: Diagnosis not present

## 2021-11-25 DIAGNOSIS — Z7982 Long term (current) use of aspirin: Secondary | ICD-10-CM | POA: Diagnosis not present

## 2021-11-25 DIAGNOSIS — T84050D Periprosthetic osteolysis of internal prosthetic right hip joint, subsequent encounter: Secondary | ICD-10-CM | POA: Diagnosis not present

## 2021-11-25 DIAGNOSIS — F32A Depression, unspecified: Secondary | ICD-10-CM | POA: Diagnosis not present

## 2021-11-27 LAB — CULTURE, BLOOD (ROUTINE X 2)
Culture: NO GROWTH
Culture: NO GROWTH

## 2021-11-30 DIAGNOSIS — I479 Paroxysmal tachycardia, unspecified: Secondary | ICD-10-CM | POA: Diagnosis not present

## 2021-11-30 DIAGNOSIS — T84050D Periprosthetic osteolysis of internal prosthetic right hip joint, subsequent encounter: Secondary | ICD-10-CM | POA: Diagnosis not present

## 2021-11-30 DIAGNOSIS — N179 Acute kidney failure, unspecified: Secondary | ICD-10-CM | POA: Diagnosis not present

## 2021-11-30 DIAGNOSIS — J449 Chronic obstructive pulmonary disease, unspecified: Secondary | ICD-10-CM | POA: Diagnosis not present

## 2021-11-30 DIAGNOSIS — T84030D Mechanical loosening of internal right hip prosthetic joint, subsequent encounter: Secondary | ICD-10-CM | POA: Diagnosis not present

## 2021-11-30 DIAGNOSIS — D696 Thrombocytopenia, unspecified: Secondary | ICD-10-CM | POA: Diagnosis not present

## 2021-12-05 ENCOUNTER — Other Ambulatory Visit: Payer: Self-pay

## 2021-12-05 ENCOUNTER — Inpatient Hospital Stay: Payer: Medicare Other

## 2021-12-05 ENCOUNTER — Telehealth: Payer: Self-pay

## 2021-12-05 ENCOUNTER — Inpatient Hospital Stay: Payer: Medicare Other | Attending: Hematology and Oncology

## 2021-12-05 ENCOUNTER — Encounter: Payer: Self-pay | Admitting: Hematology and Oncology

## 2021-12-05 ENCOUNTER — Inpatient Hospital Stay (HOSPITAL_BASED_OUTPATIENT_CLINIC_OR_DEPARTMENT_OTHER): Payer: Medicare Other | Admitting: Hematology and Oncology

## 2021-12-05 VITALS — BP 109/65 | HR 70 | Temp 98.0°F | Resp 16 | Ht 68.0 in | Wt 157.4 lb

## 2021-12-05 DIAGNOSIS — K7460 Unspecified cirrhosis of liver: Secondary | ICD-10-CM | POA: Diagnosis not present

## 2021-12-05 DIAGNOSIS — D4620 Refractory anemia with excess of blasts, unspecified: Secondary | ICD-10-CM | POA: Diagnosis not present

## 2021-12-05 DIAGNOSIS — D539 Nutritional anemia, unspecified: Secondary | ICD-10-CM | POA: Diagnosis not present

## 2021-12-05 DIAGNOSIS — D696 Thrombocytopenia, unspecified: Secondary | ICD-10-CM

## 2021-12-05 LAB — CBC WITH DIFFERENTIAL (CANCER CENTER ONLY)
Abs Immature Granulocytes: 0.01 10*3/uL (ref 0.00–0.07)
Basophils Absolute: 0 10*3/uL (ref 0.0–0.1)
Basophils Relative: 1 %
Eosinophils Absolute: 0.1 10*3/uL (ref 0.0–0.5)
Eosinophils Relative: 2 %
HCT: 26.2 % — ABNORMAL LOW (ref 39.0–52.0)
Hemoglobin: 7.8 g/dL — ABNORMAL LOW (ref 13.0–17.0)
Immature Granulocytes: 0 %
Lymphocytes Relative: 18 %
Lymphs Abs: 1.1 10*3/uL (ref 0.7–4.0)
MCH: 30.7 pg (ref 26.0–34.0)
MCHC: 29.8 g/dL — ABNORMAL LOW (ref 30.0–36.0)
MCV: 103.1 fL — ABNORMAL HIGH (ref 80.0–100.0)
Monocytes Absolute: 0.6 10*3/uL (ref 0.1–1.0)
Monocytes Relative: 10 %
Neutro Abs: 4.2 10*3/uL (ref 1.7–7.7)
Neutrophils Relative %: 69 %
Platelet Count: 124 10*3/uL — ABNORMAL LOW (ref 150–400)
RBC: 2.54 MIL/uL — ABNORMAL LOW (ref 4.22–5.81)
RDW: 22.4 % — ABNORMAL HIGH (ref 11.5–15.5)
WBC Count: 6.1 10*3/uL (ref 4.0–10.5)
nRBC: 0 % (ref 0.0–0.2)

## 2021-12-05 LAB — CMP (CANCER CENTER ONLY)
ALT: 14 U/L (ref 0–44)
AST: 45 U/L — ABNORMAL HIGH (ref 15–41)
Albumin: 2.7 g/dL — ABNORMAL LOW (ref 3.5–5.0)
Alkaline Phosphatase: 66 U/L (ref 38–126)
Anion gap: 8 (ref 5–15)
BUN: 27 mg/dL — ABNORMAL HIGH (ref 8–23)
CO2: 23 mmol/L (ref 22–32)
Calcium: 8.7 mg/dL — ABNORMAL LOW (ref 8.9–10.3)
Chloride: 108 mmol/L (ref 98–111)
Creatinine: 2.28 mg/dL — ABNORMAL HIGH (ref 0.61–1.24)
GFR, Estimated: 30 mL/min — ABNORMAL LOW (ref >60)
Glucose, Bld: 159 mg/dL — ABNORMAL HIGH (ref 70–99)
Potassium: 4.4 mmol/L (ref 3.5–5.1)
Sodium: 139 mmol/L (ref 135–145)
Total Bilirubin: 4.6 mg/dL (ref 0.3–1.2)
Total Protein: 6.5 g/dL (ref 6.5–8.1)

## 2021-12-05 MED ORDER — EPOETIN ALFA-EPBX 40000 UNIT/ML IJ SOLN
40000.0000 [IU] | Freq: Once | INTRAMUSCULAR | Status: AC
  Administered 2021-12-05: 40000 [IU] via SUBCUTANEOUS
  Filled 2021-12-05: qty 1

## 2021-12-05 MED ORDER — EPOETIN ALFA-EPBX 20000 UNIT/ML IJ SOLN
20000.0000 [IU] | Freq: Once | INTRAMUSCULAR | Status: AC
  Administered 2021-12-05: 20000 [IU] via SUBCUTANEOUS
  Filled 2021-12-05: qty 1

## 2021-12-05 NOTE — Telephone Encounter (Signed)
CRITICAL VALUE STICKER ? ?CRITICAL VALUE:    total bilirubin  4.6 ? ?RECEIVER (on-site recipient of call):  Bonnita Nasuti, LPN ? ?DATE & TIME NOTIFIED:   12/05/2021   1:55 ? ?MESSENGER (representative from lab):  Lauren ? ?MD NOTIFIED:   Loma Messing ? ?TIME OF NOTIFICATION:  1:55 ? ?  ? ?
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
## 2021-12-06 ENCOUNTER — Telehealth: Payer: Self-pay | Admitting: Hematology and Oncology

## 2021-12-06 NOTE — Telephone Encounter (Signed)
Scheduled per 4/24 los, pt has been called and confirmed ?

## 2021-12-19 ENCOUNTER — Encounter: Payer: Self-pay | Admitting: *Deleted

## 2021-12-19 ENCOUNTER — Other Ambulatory Visit: Payer: Self-pay

## 2021-12-19 ENCOUNTER — Inpatient Hospital Stay: Payer: Medicare Other

## 2021-12-19 ENCOUNTER — Inpatient Hospital Stay: Payer: Medicare Other | Attending: Hematology and Oncology

## 2021-12-19 VITALS — BP 147/67 | HR 96 | Temp 98.4°F | Resp 16

## 2021-12-19 DIAGNOSIS — Z7289 Other problems related to lifestyle: Secondary | ICD-10-CM | POA: Diagnosis not present

## 2021-12-19 DIAGNOSIS — R609 Edema, unspecified: Secondary | ICD-10-CM | POA: Diagnosis not present

## 2021-12-19 DIAGNOSIS — Z79899 Other long term (current) drug therapy: Secondary | ICD-10-CM | POA: Insufficient documentation

## 2021-12-19 DIAGNOSIS — Z800 Family history of malignant neoplasm of digestive organs: Secondary | ICD-10-CM | POA: Insufficient documentation

## 2021-12-19 DIAGNOSIS — Z87891 Personal history of nicotine dependence: Secondary | ICD-10-CM | POA: Insufficient documentation

## 2021-12-19 DIAGNOSIS — D4620 Refractory anemia with excess of blasts, unspecified: Secondary | ICD-10-CM | POA: Insufficient documentation

## 2021-12-19 DIAGNOSIS — N1832 Chronic kidney disease, stage 3b: Secondary | ICD-10-CM | POA: Diagnosis not present

## 2021-12-19 DIAGNOSIS — K7030 Alcoholic cirrhosis of liver without ascites: Secondary | ICD-10-CM | POA: Diagnosis not present

## 2021-12-19 LAB — CBC WITH DIFFERENTIAL (CANCER CENTER ONLY)
Abs Immature Granulocytes: 0.01 10*3/uL (ref 0.00–0.07)
Basophils Absolute: 0 10*3/uL (ref 0.0–0.1)
Basophils Relative: 0 %
Eosinophils Absolute: 0.1 10*3/uL (ref 0.0–0.5)
Eosinophils Relative: 3 %
HCT: 24.7 % — ABNORMAL LOW (ref 39.0–52.0)
Hemoglobin: 7.4 g/dL — ABNORMAL LOW (ref 13.0–17.0)
Immature Granulocytes: 0 %
Lymphocytes Relative: 22 %
Lymphs Abs: 0.7 10*3/uL (ref 0.7–4.0)
MCH: 31.1 pg (ref 26.0–34.0)
MCHC: 30 g/dL (ref 30.0–36.0)
MCV: 103.8 fL — ABNORMAL HIGH (ref 80.0–100.0)
Monocytes Absolute: 0.3 10*3/uL (ref 0.1–1.0)
Monocytes Relative: 10 %
Neutro Abs: 2 10*3/uL (ref 1.7–7.7)
Neutrophils Relative %: 65 %
Platelet Count: 66 10*3/uL — ABNORMAL LOW (ref 150–400)
RBC: 2.38 MIL/uL — ABNORMAL LOW (ref 4.22–5.81)
RDW: 22 % — ABNORMAL HIGH (ref 11.5–15.5)
WBC Count: 3.1 10*3/uL — ABNORMAL LOW (ref 4.0–10.5)
nRBC: 0 % (ref 0.0–0.2)

## 2021-12-19 LAB — CMP (CANCER CENTER ONLY)
ALT: 13 U/L (ref 0–44)
AST: 40 U/L (ref 15–41)
Albumin: 2.7 g/dL — ABNORMAL LOW (ref 3.5–5.0)
Alkaline Phosphatase: 93 U/L (ref 38–126)
Anion gap: 4 — ABNORMAL LOW (ref 5–15)
BUN: 22 mg/dL (ref 8–23)
CO2: 26 mmol/L (ref 22–32)
Calcium: 9.1 mg/dL (ref 8.9–10.3)
Chloride: 113 mmol/L — ABNORMAL HIGH (ref 98–111)
Creatinine: 1.73 mg/dL — ABNORMAL HIGH (ref 0.61–1.24)
GFR, Estimated: 42 mL/min — ABNORMAL LOW (ref >60)
Glucose, Bld: 92 mg/dL (ref 70–99)
Potassium: 4.1 mmol/L (ref 3.5–5.1)
Sodium: 143 mmol/L (ref 135–145)
Total Bilirubin: 4.7 mg/dL (ref 0.3–1.2)
Total Protein: 6.5 g/dL (ref 6.5–8.1)

## 2021-12-19 MED ORDER — EPOETIN ALFA-EPBX 40000 UNIT/ML IJ SOLN
40000.0000 [IU] | Freq: Once | INTRAMUSCULAR | Status: AC
  Administered 2021-12-19: 40000 [IU] via SUBCUTANEOUS
  Filled 2021-12-19: qty 1

## 2021-12-19 MED ORDER — EPOETIN ALFA-EPBX 20000 UNIT/ML IJ SOLN
20000.0000 [IU] | Freq: Once | INTRAMUSCULAR | Status: AC
  Administered 2021-12-19: 20000 [IU] via SUBCUTANEOUS
  Filled 2021-12-19: qty 1

## 2021-12-19 NOTE — Progress Notes (Signed)
CRITICAL VALUE STICKER ? ?CRITICAL VALUE: T Bili 4.7 ? ?RECEIVER (on-site recipient of call): Shearon Balo, RN  ? ?DATE & TIME NOTIFIED: 12/19/21, 1446 ? ?MESSENGER (representative from lab): Lawana Pai  ? ?MD NOTIFIED: Irene Limbo ? ?TIME OF NOTIFICATION: 3014 ? ?RESPONSE:  No additional orders at this time.  ?
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
## 2021-12-27 DIAGNOSIS — Z96641 Presence of right artificial hip joint: Secondary | ICD-10-CM | POA: Diagnosis not present

## 2021-12-28 DIAGNOSIS — T84050D Periprosthetic osteolysis of internal prosthetic right hip joint, subsequent encounter: Secondary | ICD-10-CM | POA: Diagnosis not present

## 2021-12-28 DIAGNOSIS — T84030D Mechanical loosening of internal right hip prosthetic joint, subsequent encounter: Secondary | ICD-10-CM | POA: Diagnosis not present

## 2021-12-28 DIAGNOSIS — J449 Chronic obstructive pulmonary disease, unspecified: Secondary | ICD-10-CM | POA: Diagnosis not present

## 2021-12-28 DIAGNOSIS — M24851 Other specific joint derangements of right hip, not elsewhere classified: Secondary | ICD-10-CM | POA: Diagnosis not present

## 2021-12-28 DIAGNOSIS — F419 Anxiety disorder, unspecified: Secondary | ICD-10-CM | POA: Diagnosis not present

## 2021-12-28 DIAGNOSIS — Z9181 History of falling: Secondary | ICD-10-CM | POA: Diagnosis not present

## 2021-12-28 DIAGNOSIS — M1990 Unspecified osteoarthritis, unspecified site: Secondary | ICD-10-CM | POA: Diagnosis not present

## 2021-12-28 DIAGNOSIS — D696 Thrombocytopenia, unspecified: Secondary | ICD-10-CM | POA: Diagnosis not present

## 2021-12-28 DIAGNOSIS — F32A Depression, unspecified: Secondary | ICD-10-CM | POA: Diagnosis not present

## 2021-12-28 DIAGNOSIS — K219 Gastro-esophageal reflux disease without esophagitis: Secondary | ICD-10-CM | POA: Diagnosis not present

## 2021-12-28 DIAGNOSIS — K7460 Unspecified cirrhosis of liver: Secondary | ICD-10-CM | POA: Diagnosis not present

## 2021-12-28 DIAGNOSIS — D469 Myelodysplastic syndrome, unspecified: Secondary | ICD-10-CM | POA: Diagnosis not present

## 2021-12-28 DIAGNOSIS — Z79891 Long term (current) use of opiate analgesic: Secondary | ICD-10-CM | POA: Diagnosis not present

## 2021-12-28 DIAGNOSIS — I471 Supraventricular tachycardia: Secondary | ICD-10-CM | POA: Diagnosis not present

## 2021-12-30 DIAGNOSIS — M24851 Other specific joint derangements of right hip, not elsewhere classified: Secondary | ICD-10-CM | POA: Diagnosis not present

## 2021-12-30 DIAGNOSIS — I471 Supraventricular tachycardia: Secondary | ICD-10-CM | POA: Diagnosis not present

## 2021-12-30 DIAGNOSIS — T84050D Periprosthetic osteolysis of internal prosthetic right hip joint, subsequent encounter: Secondary | ICD-10-CM | POA: Diagnosis not present

## 2021-12-30 DIAGNOSIS — J449 Chronic obstructive pulmonary disease, unspecified: Secondary | ICD-10-CM | POA: Diagnosis not present

## 2021-12-30 DIAGNOSIS — K7460 Unspecified cirrhosis of liver: Secondary | ICD-10-CM | POA: Diagnosis not present

## 2021-12-30 DIAGNOSIS — T84030D Mechanical loosening of internal right hip prosthetic joint, subsequent encounter: Secondary | ICD-10-CM | POA: Diagnosis not present

## 2022-01-02 ENCOUNTER — Other Ambulatory Visit: Payer: Self-pay

## 2022-01-02 ENCOUNTER — Inpatient Hospital Stay: Payer: Medicare Other

## 2022-01-02 ENCOUNTER — Telehealth: Payer: Self-pay

## 2022-01-02 VITALS — BP 121/60 | HR 95 | Temp 98.9°F | Resp 16

## 2022-01-02 DIAGNOSIS — D4620 Refractory anemia with excess of blasts, unspecified: Secondary | ICD-10-CM | POA: Diagnosis not present

## 2022-01-02 DIAGNOSIS — Z7289 Other problems related to lifestyle: Secondary | ICD-10-CM | POA: Diagnosis not present

## 2022-01-02 DIAGNOSIS — Z800 Family history of malignant neoplasm of digestive organs: Secondary | ICD-10-CM | POA: Diagnosis not present

## 2022-01-02 DIAGNOSIS — Z87891 Personal history of nicotine dependence: Secondary | ICD-10-CM | POA: Diagnosis not present

## 2022-01-02 DIAGNOSIS — Z79899 Other long term (current) drug therapy: Secondary | ICD-10-CM | POA: Diagnosis not present

## 2022-01-02 LAB — CBC WITH DIFFERENTIAL (CANCER CENTER ONLY)
Abs Immature Granulocytes: 0.01 10*3/uL (ref 0.00–0.07)
Basophils Absolute: 0 10*3/uL (ref 0.0–0.1)
Basophils Relative: 0 %
Eosinophils Absolute: 0.1 10*3/uL (ref 0.0–0.5)
Eosinophils Relative: 3 %
HCT: 24.8 % — ABNORMAL LOW (ref 39.0–52.0)
Hemoglobin: 7.7 g/dL — ABNORMAL LOW (ref 13.0–17.0)
Immature Granulocytes: 0 %
Lymphocytes Relative: 21 %
Lymphs Abs: 0.7 10*3/uL (ref 0.7–4.0)
MCH: 31.7 pg (ref 26.0–34.0)
MCHC: 31 g/dL (ref 30.0–36.0)
MCV: 102.1 fL — ABNORMAL HIGH (ref 80.0–100.0)
Monocytes Absolute: 0.4 10*3/uL (ref 0.1–1.0)
Monocytes Relative: 12 %
Neutro Abs: 2.3 10*3/uL (ref 1.7–7.7)
Neutrophils Relative %: 64 %
Platelet Count: 68 10*3/uL — ABNORMAL LOW (ref 150–400)
RBC: 2.43 MIL/uL — ABNORMAL LOW (ref 4.22–5.81)
RDW: 19.9 % — ABNORMAL HIGH (ref 11.5–15.5)
WBC Count: 3.5 10*3/uL — ABNORMAL LOW (ref 4.0–10.5)
nRBC: 0 % (ref 0.0–0.2)

## 2022-01-02 LAB — CMP (CANCER CENTER ONLY)
ALT: 16 U/L (ref 0–44)
AST: 45 U/L — ABNORMAL HIGH (ref 15–41)
Albumin: 3 g/dL — ABNORMAL LOW (ref 3.5–5.0)
Alkaline Phosphatase: 98 U/L (ref 38–126)
Anion gap: 5 (ref 5–15)
BUN: 30 mg/dL — ABNORMAL HIGH (ref 8–23)
CO2: 24 mmol/L (ref 22–32)
Calcium: 9.5 mg/dL (ref 8.9–10.3)
Chloride: 109 mmol/L (ref 98–111)
Creatinine: 1.65 mg/dL — ABNORMAL HIGH (ref 0.61–1.24)
GFR, Estimated: 45 mL/min — ABNORMAL LOW (ref >60)
Glucose, Bld: 92 mg/dL (ref 70–99)
Potassium: 4.5 mmol/L (ref 3.5–5.1)
Sodium: 138 mmol/L (ref 135–145)
Total Bilirubin: 4.6 mg/dL (ref 0.3–1.2)
Total Protein: 7.2 g/dL (ref 6.5–8.1)

## 2022-01-02 MED ORDER — EPOETIN ALFA-EPBX 20000 UNIT/ML IJ SOLN
20000.0000 [IU] | Freq: Once | INTRAMUSCULAR | Status: AC
  Administered 2022-01-02: 20000 [IU] via SUBCUTANEOUS
  Filled 2022-01-02: qty 1

## 2022-01-02 MED ORDER — EPOETIN ALFA-EPBX 40000 UNIT/ML IJ SOLN
40000.0000 [IU] | Freq: Once | INTRAMUSCULAR | Status: AC
  Administered 2022-01-02: 40000 [IU] via SUBCUTANEOUS
  Filled 2022-01-02: qty 1

## 2022-01-02 NOTE — Patient Instructions (Signed)
Epoetin Alfa injection ?What is this medication? ?EPOETIN ALFA (e POE e tin AL fa) helps your body make more red blood cells. This medicine is used to treat anemia caused by chronic kidney disease, cancer chemotherapy, or HIV-therapy. It may also be used before surgery if you have anemia. ?This medicine may be used for other purposes; ask your health care provider or pharmacist if you have questions. ?COMMON BRAND NAME(S): Epogen, Procrit, Retacrit ?What should I tell my care team before I take this medication? ?They need to know if you have any of these conditions: ?cancer ?heart disease ?high blood pressure ?history of blood clots ?history of stroke ?low levels of folate, iron, or vitamin B12 in the blood ?seizures ?an unusual or allergic reaction to erythropoietin, albumin, benzyl alcohol, hamster proteins, other medicines, foods, dyes, or preservatives ?pregnant or trying to get pregnant ?breast-feeding ?How should I use this medication? ?This medicine is for injection into a vein or under the skin. It is usually given by a health care professional in a hospital or clinic setting. ?If you get this medicine at home, you will be taught how to prepare and give this medicine. Use exactly as directed. Take your medicine at regular intervals. Do not take your medicine more often than directed. ?It is important that you put your used needles and syringes in a special sharps container. Do not put them in a trash can. If you do not have a sharps container, call your pharmacist or healthcare provider to get one. ?A special MedGuide will be given to you by the pharmacist with each prescription and refill. Be sure to read this information carefully each time. ?Talk to your pediatrician regarding the use of this medicine in children. While this drug may be prescribed for selected conditions, precautions do apply. ?Overdosage: If you think you have taken too much of this medicine contact a poison control center or emergency  room at once. ?NOTE: This medicine is only for you. Do not share this medicine with others. ?What if I miss a dose? ?If you miss a dose, take it as soon as you can. If it is almost time for your next dose, take only that dose. Do not take double or extra doses. ?What may interact with this medication? ?Interactions have not been studied. ?This list may not describe all possible interactions. Give your health care provider a list of all the medicines, herbs, non-prescription drugs, or dietary supplements you use. Also tell them if you smoke, drink alcohol, or use illegal drugs. Some items may interact with your medicine. ?What should I watch for while using this medication? ?Your condition will be monitored carefully while you are receiving this medicine. ?You may need blood work done while you are taking this medicine. ?This medicine may cause a decrease in vitamin B6. You should make sure that you get enough vitamin B6 while you are taking this medicine. Discuss the foods you eat and the vitamins you take with your health care professional. ?What side effects may I notice from receiving this medication? ?Side effects that you should report to your doctor or health care professional as soon as possible: ?allergic reactions like skin rash, itching or hives, swelling of the face, lips, or tongue ?seizures ?signs and symptoms of a blood clot such as breathing problems; changes in vision; chest pain; severe, sudden headache; pain, swelling, warmth in the leg; trouble speaking; sudden numbness or weakness of the face, arm or leg ?signs and symptoms of a stroke like   changes in vision; confusion; trouble speaking or understanding; severe headaches; sudden numbness or weakness of the face, arm or leg; trouble walking; dizziness; loss of balance or coordination ?Side effects that usually do not require medical attention (report to your doctor or health care professional if they continue or are  bothersome): ?chills ?cough ?dizziness ?fever ?headaches ?joint pain ?muscle cramps ?muscle pain ?nausea, vomiting ?pain, redness, or irritation at site where injected ?This list may not describe all possible side effects. Call your doctor for medical advice about side effects. You may report side effects to FDA at 1-800-FDA-1088. ?Where should I keep my medication? ?Keep out of the reach of children. ?Store in a refrigerator between 2 and 8 degrees C (36 and 46 degrees F). Do not freeze or shake. Throw away any unused portion if using a single-dose vial. Multi-dose vials can be kept in the refrigerator for up to 21 days after the initial dose. Throw away unused medicine. ?NOTE: This sheet is a summary. It may not cover all possible information. If you have questions about this medicine, talk to your doctor, pharmacist, or health care provider. ?? 2023 Elsevier/Gold Standard (2017-04-03 00:00:00) ? ?

## 2022-01-02 NOTE — Telephone Encounter (Signed)
CRITICAL VALUE STICKER  CRITICAL VALUE: Total Bilirubin 4.6 mg/dl  RECEIVER (on-site recipient of call): Karrissa Parchment RN  [place] NOTIFIED: 01/02/22  3:13 PM  MESSENGER (representative from lab):  MD NOTIFIED: Dr. Lorenso Courier  TIME OF NOTIFICATION: 3:14  RESPONSE:  OK

## 2022-01-04 ENCOUNTER — Encounter: Payer: Self-pay | Admitting: Hematology and Oncology

## 2022-01-04 DIAGNOSIS — F419 Anxiety disorder, unspecified: Secondary | ICD-10-CM | POA: Diagnosis not present

## 2022-01-04 DIAGNOSIS — R600 Localized edema: Secondary | ICD-10-CM | POA: Diagnosis not present

## 2022-01-04 DIAGNOSIS — G4700 Insomnia, unspecified: Secondary | ICD-10-CM | POA: Diagnosis not present

## 2022-01-05 DIAGNOSIS — J449 Chronic obstructive pulmonary disease, unspecified: Secondary | ICD-10-CM | POA: Diagnosis not present

## 2022-01-05 DIAGNOSIS — M24851 Other specific joint derangements of right hip, not elsewhere classified: Secondary | ICD-10-CM | POA: Diagnosis not present

## 2022-01-05 DIAGNOSIS — K7460 Unspecified cirrhosis of liver: Secondary | ICD-10-CM | POA: Diagnosis not present

## 2022-01-05 DIAGNOSIS — T84050D Periprosthetic osteolysis of internal prosthetic right hip joint, subsequent encounter: Secondary | ICD-10-CM | POA: Diagnosis not present

## 2022-01-05 DIAGNOSIS — I471 Supraventricular tachycardia: Secondary | ICD-10-CM | POA: Diagnosis not present

## 2022-01-05 DIAGNOSIS — T84030D Mechanical loosening of internal right hip prosthetic joint, subsequent encounter: Secondary | ICD-10-CM | POA: Diagnosis not present

## 2022-01-11 DIAGNOSIS — K7460 Unspecified cirrhosis of liver: Secondary | ICD-10-CM | POA: Diagnosis not present

## 2022-01-11 DIAGNOSIS — T84050D Periprosthetic osteolysis of internal prosthetic right hip joint, subsequent encounter: Secondary | ICD-10-CM | POA: Diagnosis not present

## 2022-01-11 DIAGNOSIS — T84030D Mechanical loosening of internal right hip prosthetic joint, subsequent encounter: Secondary | ICD-10-CM | POA: Diagnosis not present

## 2022-01-11 DIAGNOSIS — J449 Chronic obstructive pulmonary disease, unspecified: Secondary | ICD-10-CM | POA: Diagnosis not present

## 2022-01-11 DIAGNOSIS — M24851 Other specific joint derangements of right hip, not elsewhere classified: Secondary | ICD-10-CM | POA: Diagnosis not present

## 2022-01-11 DIAGNOSIS — I471 Supraventricular tachycardia: Secondary | ICD-10-CM | POA: Diagnosis not present

## 2022-01-13 DIAGNOSIS — I471 Supraventricular tachycardia: Secondary | ICD-10-CM | POA: Diagnosis not present

## 2022-01-13 DIAGNOSIS — T84050D Periprosthetic osteolysis of internal prosthetic right hip joint, subsequent encounter: Secondary | ICD-10-CM | POA: Diagnosis not present

## 2022-01-13 DIAGNOSIS — T84030D Mechanical loosening of internal right hip prosthetic joint, subsequent encounter: Secondary | ICD-10-CM | POA: Diagnosis not present

## 2022-01-13 DIAGNOSIS — J449 Chronic obstructive pulmonary disease, unspecified: Secondary | ICD-10-CM | POA: Diagnosis not present

## 2022-01-13 DIAGNOSIS — M24851 Other specific joint derangements of right hip, not elsewhere classified: Secondary | ICD-10-CM | POA: Diagnosis not present

## 2022-01-13 DIAGNOSIS — K7460 Unspecified cirrhosis of liver: Secondary | ICD-10-CM | POA: Diagnosis not present

## 2022-01-16 ENCOUNTER — Inpatient Hospital Stay: Payer: Medicare Other

## 2022-01-16 ENCOUNTER — Inpatient Hospital Stay: Payer: Medicare Other | Attending: Hematology and Oncology

## 2022-01-16 ENCOUNTER — Other Ambulatory Visit: Payer: Self-pay

## 2022-01-16 VITALS — BP 114/55 | HR 84 | Temp 99.3°F | Resp 18

## 2022-01-16 DIAGNOSIS — Z888 Allergy status to other drugs, medicaments and biological substances status: Secondary | ICD-10-CM | POA: Diagnosis not present

## 2022-01-16 DIAGNOSIS — I252 Old myocardial infarction: Secondary | ICD-10-CM | POA: Insufficient documentation

## 2022-01-16 DIAGNOSIS — Z885 Allergy status to narcotic agent status: Secondary | ICD-10-CM | POA: Diagnosis not present

## 2022-01-16 DIAGNOSIS — D539 Nutritional anemia, unspecified: Secondary | ICD-10-CM | POA: Diagnosis not present

## 2022-01-16 DIAGNOSIS — Z882 Allergy status to sulfonamides status: Secondary | ICD-10-CM | POA: Insufficient documentation

## 2022-01-16 DIAGNOSIS — R600 Localized edema: Secondary | ICD-10-CM | POA: Insufficient documentation

## 2022-01-16 DIAGNOSIS — K7460 Unspecified cirrhosis of liver: Secondary | ICD-10-CM | POA: Insufficient documentation

## 2022-01-16 DIAGNOSIS — R7989 Other specified abnormal findings of blood chemistry: Secondary | ICD-10-CM | POA: Insufficient documentation

## 2022-01-16 DIAGNOSIS — Z883 Allergy status to other anti-infective agents status: Secondary | ICD-10-CM | POA: Diagnosis not present

## 2022-01-16 DIAGNOSIS — Z7289 Other problems related to lifestyle: Secondary | ICD-10-CM | POA: Insufficient documentation

## 2022-01-16 DIAGNOSIS — D4620 Refractory anemia with excess of blasts, unspecified: Secondary | ICD-10-CM | POA: Diagnosis present

## 2022-01-16 DIAGNOSIS — Z800 Family history of malignant neoplasm of digestive organs: Secondary | ICD-10-CM | POA: Diagnosis not present

## 2022-01-16 DIAGNOSIS — D696 Thrombocytopenia, unspecified: Secondary | ICD-10-CM | POA: Diagnosis not present

## 2022-01-16 DIAGNOSIS — Z79899 Other long term (current) drug therapy: Secondary | ICD-10-CM | POA: Diagnosis not present

## 2022-01-16 DIAGNOSIS — Z87891 Personal history of nicotine dependence: Secondary | ICD-10-CM | POA: Diagnosis not present

## 2022-01-16 DIAGNOSIS — D469 Myelodysplastic syndrome, unspecified: Secondary | ICD-10-CM | POA: Insufficient documentation

## 2022-01-16 LAB — CBC WITH DIFFERENTIAL (CANCER CENTER ONLY)
Abs Immature Granulocytes: 0.01 10*3/uL (ref 0.00–0.07)
Basophils Absolute: 0 10*3/uL (ref 0.0–0.1)
Basophils Relative: 1 %
Eosinophils Absolute: 0.1 10*3/uL (ref 0.0–0.5)
Eosinophils Relative: 3 %
HCT: 25.9 % — ABNORMAL LOW (ref 39.0–52.0)
Hemoglobin: 7.9 g/dL — ABNORMAL LOW (ref 13.0–17.0)
Immature Granulocytes: 0 %
Lymphocytes Relative: 18 %
Lymphs Abs: 0.8 10*3/uL (ref 0.7–4.0)
MCH: 31.1 pg (ref 26.0–34.0)
MCHC: 30.5 g/dL (ref 30.0–36.0)
MCV: 102 fL — ABNORMAL HIGH (ref 80.0–100.0)
Monocytes Absolute: 0.5 10*3/uL (ref 0.1–1.0)
Monocytes Relative: 11 %
Neutro Abs: 2.9 10*3/uL (ref 1.7–7.7)
Neutrophils Relative %: 67 %
Platelet Count: 85 10*3/uL — ABNORMAL LOW (ref 150–400)
RBC: 2.54 MIL/uL — ABNORMAL LOW (ref 4.22–5.81)
RDW: 19.2 % — ABNORMAL HIGH (ref 11.5–15.5)
WBC Count: 4.2 10*3/uL (ref 4.0–10.5)
nRBC: 0 % (ref 0.0–0.2)

## 2022-01-16 LAB — CMP (CANCER CENTER ONLY)
ALT: 13 U/L (ref 0–44)
AST: 35 U/L (ref 15–41)
Albumin: 3 g/dL — ABNORMAL LOW (ref 3.5–5.0)
Alkaline Phosphatase: 92 U/L (ref 38–126)
Anion gap: 5 (ref 5–15)
BUN: 22 mg/dL (ref 8–23)
CO2: 23 mmol/L (ref 22–32)
Calcium: 9.1 mg/dL (ref 8.9–10.3)
Chloride: 109 mmol/L (ref 98–111)
Creatinine: 1.53 mg/dL — ABNORMAL HIGH (ref 0.61–1.24)
GFR, Estimated: 49 mL/min — ABNORMAL LOW (ref >60)
Glucose, Bld: 94 mg/dL (ref 70–99)
Potassium: 4.4 mmol/L (ref 3.5–5.1)
Sodium: 137 mmol/L (ref 135–145)
Total Bilirubin: 3.9 mg/dL (ref 0.3–1.2)
Total Protein: 6.6 g/dL (ref 6.5–8.1)

## 2022-01-16 MED ORDER — EPOETIN ALFA-EPBX 20000 UNIT/ML IJ SOLN
20000.0000 [IU] | Freq: Once | INTRAMUSCULAR | Status: AC
  Administered 2022-01-16: 20000 [IU] via SUBCUTANEOUS
  Filled 2022-01-16: qty 1

## 2022-01-16 MED ORDER — EPOETIN ALFA-EPBX 40000 UNIT/ML IJ SOLN
40000.0000 [IU] | Freq: Once | INTRAMUSCULAR | Status: AC
  Administered 2022-01-16: 40000 [IU] via SUBCUTANEOUS
  Filled 2022-01-16: qty 1

## 2022-01-17 ENCOUNTER — Encounter: Payer: Self-pay | Admitting: Gastroenterology

## 2022-01-20 DIAGNOSIS — J449 Chronic obstructive pulmonary disease, unspecified: Secondary | ICD-10-CM | POA: Diagnosis not present

## 2022-01-20 DIAGNOSIS — T84030D Mechanical loosening of internal right hip prosthetic joint, subsequent encounter: Secondary | ICD-10-CM | POA: Diagnosis not present

## 2022-01-20 DIAGNOSIS — K7460 Unspecified cirrhosis of liver: Secondary | ICD-10-CM | POA: Diagnosis not present

## 2022-01-20 DIAGNOSIS — T84050D Periprosthetic osteolysis of internal prosthetic right hip joint, subsequent encounter: Secondary | ICD-10-CM | POA: Diagnosis not present

## 2022-01-20 DIAGNOSIS — M24851 Other specific joint derangements of right hip, not elsewhere classified: Secondary | ICD-10-CM | POA: Diagnosis not present

## 2022-01-20 DIAGNOSIS — I471 Supraventricular tachycardia: Secondary | ICD-10-CM | POA: Diagnosis not present

## 2022-01-23 DIAGNOSIS — I8500 Esophageal varices without bleeding: Secondary | ICD-10-CM | POA: Diagnosis not present

## 2022-01-23 DIAGNOSIS — K7030 Alcoholic cirrhosis of liver without ascites: Secondary | ICD-10-CM | POA: Diagnosis not present

## 2022-01-24 ENCOUNTER — Other Ambulatory Visit: Payer: Self-pay | Admitting: Nurse Practitioner

## 2022-01-24 DIAGNOSIS — K7030 Alcoholic cirrhosis of liver without ascites: Secondary | ICD-10-CM

## 2022-01-30 ENCOUNTER — Other Ambulatory Visit: Payer: Self-pay

## 2022-01-30 ENCOUNTER — Inpatient Hospital Stay: Payer: Medicare Other

## 2022-01-30 ENCOUNTER — Inpatient Hospital Stay (HOSPITAL_BASED_OUTPATIENT_CLINIC_OR_DEPARTMENT_OTHER): Payer: Medicare Other | Admitting: Hematology and Oncology

## 2022-01-30 VITALS — BP 137/64 | HR 89 | Temp 97.6°F | Resp 17 | Ht 68.0 in | Wt 149.0 lb

## 2022-01-30 DIAGNOSIS — D696 Thrombocytopenia, unspecified: Secondary | ICD-10-CM | POA: Diagnosis not present

## 2022-01-30 DIAGNOSIS — R7989 Other specified abnormal findings of blood chemistry: Secondary | ICD-10-CM | POA: Diagnosis not present

## 2022-01-30 DIAGNOSIS — D539 Nutritional anemia, unspecified: Secondary | ICD-10-CM

## 2022-01-30 DIAGNOSIS — D4620 Refractory anemia with excess of blasts, unspecified: Secondary | ICD-10-CM

## 2022-01-30 DIAGNOSIS — R600 Localized edema: Secondary | ICD-10-CM | POA: Diagnosis not present

## 2022-01-30 DIAGNOSIS — D469 Myelodysplastic syndrome, unspecified: Secondary | ICD-10-CM | POA: Diagnosis not present

## 2022-01-30 DIAGNOSIS — I252 Old myocardial infarction: Secondary | ICD-10-CM | POA: Diagnosis not present

## 2022-01-30 LAB — CMP (CANCER CENTER ONLY)
ALT: 13 U/L (ref 0–44)
AST: 32 U/L (ref 15–41)
Albumin: 3.2 g/dL — ABNORMAL LOW (ref 3.5–5.0)
Alkaline Phosphatase: 88 U/L (ref 38–126)
Anion gap: 8 (ref 5–15)
BUN: 31 mg/dL — ABNORMAL HIGH (ref 8–23)
CO2: 20 mmol/L — ABNORMAL LOW (ref 22–32)
Calcium: 10 mg/dL (ref 8.9–10.3)
Chloride: 110 mmol/L (ref 98–111)
Creatinine: 1.61 mg/dL — ABNORMAL HIGH (ref 0.61–1.24)
GFR, Estimated: 46 mL/min — ABNORMAL LOW (ref >60)
Glucose, Bld: 99 mg/dL (ref 70–99)
Potassium: 4.7 mmol/L (ref 3.5–5.1)
Sodium: 138 mmol/L (ref 135–145)
Total Bilirubin: 3.5 mg/dL — ABNORMAL HIGH (ref 0.3–1.2)
Total Protein: 7.2 g/dL (ref 6.5–8.1)

## 2022-01-30 LAB — CBC WITH DIFFERENTIAL (CANCER CENTER ONLY)
Abs Immature Granulocytes: 0.01 10*3/uL (ref 0.00–0.07)
Basophils Absolute: 0 10*3/uL (ref 0.0–0.1)
Basophils Relative: 0 %
Eosinophils Absolute: 0.1 10*3/uL (ref 0.0–0.5)
Eosinophils Relative: 2 %
HCT: 25.2 % — ABNORMAL LOW (ref 39.0–52.0)
Hemoglobin: 7.8 g/dL — ABNORMAL LOW (ref 13.0–17.0)
Immature Granulocytes: 0 %
Lymphocytes Relative: 15 %
Lymphs Abs: 0.5 10*3/uL — ABNORMAL LOW (ref 0.7–4.0)
MCH: 30.8 pg (ref 26.0–34.0)
MCHC: 31 g/dL (ref 30.0–36.0)
MCV: 99.6 fL (ref 80.0–100.0)
Monocytes Absolute: 0.3 10*3/uL (ref 0.1–1.0)
Monocytes Relative: 9 %
Neutro Abs: 2.7 10*3/uL (ref 1.7–7.7)
Neutrophils Relative %: 74 %
Platelet Count: 101 10*3/uL — ABNORMAL LOW (ref 150–400)
RBC: 2.53 MIL/uL — ABNORMAL LOW (ref 4.22–5.81)
RDW: 18.8 % — ABNORMAL HIGH (ref 11.5–15.5)
WBC Count: 3.7 10*3/uL — ABNORMAL LOW (ref 4.0–10.5)
nRBC: 0 % (ref 0.0–0.2)

## 2022-01-30 MED ORDER — MIRTAZAPINE 7.5 MG PO TABS: 7.5000 mg | ORAL_TABLET | Freq: Every day | ORAL | 0 refills | Status: DC

## 2022-01-30 MED ORDER — EPOETIN ALFA-EPBX 40000 UNIT/ML IJ SOLN
40000.0000 [IU] | Freq: Once | INTRAMUSCULAR | Status: AC
  Administered 2022-01-30: 40000 [IU] via SUBCUTANEOUS
  Filled 2022-01-30: qty 1

## 2022-01-30 MED ORDER — OXYCODONE HCL 5 MG PO TABS: 5.0000 mg | ORAL_TABLET | ORAL | 0 refills | Status: DC | PRN

## 2022-01-30 MED ORDER — EPOETIN ALFA-EPBX 20000 UNIT/ML IJ SOLN
20000.0000 [IU] | Freq: Once | INTRAMUSCULAR | Status: AC
  Administered 2022-01-30: 20000 [IU] via SUBCUTANEOUS
  Filled 2022-01-30: qty 1

## 2022-01-31 ENCOUNTER — Encounter: Payer: Self-pay | Admitting: Hematology and Oncology

## 2022-01-31 ENCOUNTER — Ambulatory Visit
Admission: RE | Admit: 2022-01-31 | Discharge: 2022-01-31 | Disposition: A | Discharge status: Home / Self Care | Payer: Medicare Other | Source: Ambulatory Visit | Attending: Nurse Practitioner | Admitting: Nurse Practitioner

## 2022-01-31 DIAGNOSIS — K7030 Alcoholic cirrhosis of liver without ascites: Secondary | ICD-10-CM

## 2022-01-31 DIAGNOSIS — N281 Cyst of kidney, acquired: Secondary | ICD-10-CM | POA: Diagnosis not present

## 2022-01-31 DIAGNOSIS — R161 Splenomegaly, not elsewhere classified: Secondary | ICD-10-CM | POA: Diagnosis not present

## 2022-01-31 DIAGNOSIS — K7689 Other specified diseases of liver: Secondary | ICD-10-CM | POA: Diagnosis not present
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
## 2022-02-08 DIAGNOSIS — Z79899 Other long term (current) drug therapy: Secondary | ICD-10-CM | POA: Diagnosis not present

## 2022-02-08 DIAGNOSIS — F419 Anxiety disorder, unspecified: Secondary | ICD-10-CM | POA: Diagnosis not present

## 2022-02-20 ENCOUNTER — Encounter: Payer: Self-pay | Admitting: Gastroenterology

## 2022-02-20 ENCOUNTER — Ambulatory Visit (INDEPENDENT_AMBULATORY_CARE_PROVIDER_SITE_OTHER): Payer: Medicare Other | Admitting: Gastroenterology

## 2022-02-20 VITALS — BP 140/70 | HR 100 | Wt 143.4 lb

## 2022-02-20 DIAGNOSIS — K7030 Alcoholic cirrhosis of liver without ascites: Secondary | ICD-10-CM | POA: Diagnosis not present

## 2022-02-20 DIAGNOSIS — K7460 Unspecified cirrhosis of liver: Secondary | ICD-10-CM

## 2022-02-20 NOTE — Patient Instructions (Signed)
Low-Sodium Eating Plan Sodium, which is an element that makes up salt, helps you maintain a healthy balance of fluids in your body. Too much sodium can increase your blood pressure and cause fluid and waste to be held in your body. Your health care provider or dietitian may recommend following this plan if you have high blood pressure (hypertension), kidney disease, liver disease, or heart failure. Eating less sodium can help lower your blood pressure, reduce swelling, and protect your heart, liver, and kidneys. What are tips for following this plan? Reading food labels The Nutrition Facts label lists the amount of sodium in one serving of the food. If you eat more than one serving, you must multiply the listed amount of sodium by the number of servings. Choose foods with less than 140 mg of sodium per serving. Avoid foods with 300 mg of sodium or more per serving. Shopping  Look for lower-sodium products, often labeled as "low-sodium" or "no salt added." Always check the sodium content, even if foods are labeled as "unsalted" or "no salt added." Buy fresh foods. Avoid canned foods and pre-made or frozen meals. Avoid canned, cured, or processed meats. Buy breads that have less than 80 mg of sodium per slice. Cooking  Eat more home-cooked food and less restaurant, buffet, and fast food. Avoid adding salt when cooking. Use salt-free seasonings or herbs instead of table salt or sea salt. Check with your health care provider or pharmacist before using salt substitutes. Cook with plant-based oils, such as canola, sunflower, or olive oil. Meal planning When eating at a restaurant, ask that your food be prepared with less salt or no salt, if possible. Avoid dishes labeled as brined, pickled, cured, smoked, or made with soy sauce, miso, or teriyaki sauce. Avoid foods that contain MSG (monosodium glutamate). MSG is sometimes added to Chinese food, bouillon, and some canned foods. Make meals that can  be grilled, baked, poached, roasted, or steamed. These are generally made with less sodium. General information Most people on this plan should limit their sodium intake to 1,500-2,000 mg (milligrams) of sodium each day. What foods should I eat? Fruits Fresh, frozen, or canned fruit. Fruit juice. Vegetables Fresh or frozen vegetables. "No salt added" canned vegetables. "No salt added" tomato sauce and paste. Low-sodium or reduced-sodium tomato and vegetable juice. Grains Low-sodium cereals, including oats, puffed wheat and rice, and shredded wheat. Low-sodium crackers. Unsalted rice. Unsalted pasta. Low-sodium bread. Whole-grain breads and whole-grain pasta. Meats and other proteins Fresh or frozen (no salt added) meat, poultry, seafood, and fish. Low-sodium canned tuna and salmon. Unsalted nuts. Dried peas, beans, and lentils without added salt. Unsalted canned beans. Eggs. Unsalted nut butters. Dairy Milk. Soy milk. Cheese that is naturally low in sodium, such as ricotta cheese, fresh mozzarella, or Swiss cheese. Low-sodium or reduced-sodium cheese. Cream cheese. Yogurt. Seasonings and condiments Fresh and dried herbs and spices. Salt-free seasonings. Low-sodium mustard and ketchup. Sodium-free salad dressing. Sodium-free light mayonnaise. Fresh or refrigerated horseradish. Lemon juice. Vinegar. Other foods Homemade, reduced-sodium, or low-sodium soups. Unsalted popcorn and pretzels. Low-salt or salt-free chips. The items listed above may not be a complete list of foods and beverages you can eat. Contact a dietitian for more information. What foods should I avoid? Vegetables Sauerkraut, pickled vegetables, and relishes. Olives. French fries. Onion rings. Regular canned vegetables (not low-sodium or reduced-sodium). Regular canned tomato sauce and paste (not low-sodium or reduced-sodium). Regular tomato and vegetable juice (not low-sodium or reduced-sodium). Frozen vegetables in  sauces. Grains   Instant hot cereals. Bread stuffing, pancake, and biscuit mixes. Croutons. Seasoned rice or pasta mixes. Noodle soup cups. Boxed or frozen macaroni and cheese. Regular salted crackers. Self-rising flour. Meats and other proteins Meat or fish that is salted, canned, smoked, spiced, or pickled. Precooked or cured meat, such as sausages or meat loaves. Berniece Salines. Ham. Pepperoni. Hot dogs. Corned beef. Chipped beef. Salt pork. Jerky. Pickled herring. Anchovies and sardines. Regular canned tuna. Salted nuts. Dairy Processed cheese and cheese spreads. Hard cheeses. Cheese curds. Blue cheese. Feta cheese. String cheese. Regular cottage cheese. Buttermilk. Canned milk. Fats and oils Salted butter. Regular margarine. Ghee. Bacon fat. Seasonings and condiments Onion salt, garlic salt, seasoned salt, table salt, and sea salt. Canned and packaged gravies. Worcestershire sauce. Tartar sauce. Barbecue sauce. Teriyaki sauce. Soy sauce, including reduced-sodium. Steak sauce. Fish sauce. Oyster sauce. Cocktail sauce. Horseradish that you find on the shelf. Regular ketchup and mustard. Meat flavorings and tenderizers. Bouillon cubes. Hot sauce. Pre-made or packaged marinades. Pre-made or packaged taco seasonings. Relishes. Regular salad dressings. Salsa. Other foods Salted popcorn and pretzels. Corn chips and puffs. Potato and tortilla chips. Canned or dried soups. Pizza. Frozen entrees and pot pies. The items listed above may not be a complete list of foods and beverages you should avoid. Contact a dietitian for more information. Summary Eating less sodium can help lower your blood pressure, reduce swelling, and protect your heart, liver, and kidneys. Most people on this plan should limit their sodium intake to 1,500-2,000 mg (milligrams) of sodium each day. Canned, boxed, and frozen foods are high in sodium. Restaurant foods, fast foods, and pizza are also very high in sodium. You also get sodium by  adding salt to food. Try to cook at home, eat more fresh fruits and vegetables, and eat less fast food and canned, processed, or prepared foods. This information is not intended to replace advice given to you by your health care provider. Make sure you discuss any questions you have with your health care provider. Document Revised: 09/05/2019 Document Reviewed: 07/02/2019 Elsevier Patient Education  [place].  If you are age 95 or older, your body mass index should be between 23-30. Your Body mass index is 21.8 kg/m. If this is out of the aforementioned range listed, please consider follow up with your Primary Care Provider.  If you are age 48 or younger, your body mass index should be between 19-25. Your Body mass index is 21.8 kg/m. If this is out of the aformentioned range listed, please consider follow up with your Primary Care Provider.   Follow a low sodium diet and continue abstaining from Alcohol.  The [facility] providers would like to encourage you to use [facility] to communicate with providers for non-urgent requests or questions.  Due to long hold times on the telephone, sending your provider a message by [facility] may be a faster and more efficient way to get a response.  Please allow 48 business hours for a response.  Please remember that this is for non-urgent requests.    It was a pleasure to see you today!  Thank you for trusting me with your gastrointestinal care!
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
## 2022-02-21 ENCOUNTER — Inpatient Hospital Stay: Payer: Medicare Other

## 2022-02-21 ENCOUNTER — Other Ambulatory Visit: Payer: Self-pay

## 2022-02-21 ENCOUNTER — Inpatient Hospital Stay: Payer: Medicare Other | Attending: Hematology and Oncology

## 2022-02-21 VITALS — BP 114/60 | HR 87 | Temp 98.9°F | Resp 16

## 2022-02-21 DIAGNOSIS — D4620 Refractory anemia with excess of blasts, unspecified: Secondary | ICD-10-CM | POA: Diagnosis not present

## 2022-02-21 DIAGNOSIS — D539 Nutritional anemia, unspecified: Secondary | ICD-10-CM

## 2022-02-21 DIAGNOSIS — Z79899 Other long term (current) drug therapy: Secondary | ICD-10-CM | POA: Insufficient documentation

## 2022-02-21 LAB — CBC WITH DIFFERENTIAL (CANCER CENTER ONLY)
Abs Immature Granulocytes: 0.01 10*3/uL (ref 0.00–0.07)
Basophils Absolute: 0 10*3/uL (ref 0.0–0.1)
Basophils Relative: 0 %
Eosinophils Absolute: 0.2 10*3/uL (ref 0.0–0.5)
Eosinophils Relative: 3 %
HCT: 25 % — ABNORMAL LOW (ref 39.0–52.0)
Hemoglobin: 7.8 g/dL — ABNORMAL LOW (ref 13.0–17.0)
Immature Granulocytes: 0 %
Lymphocytes Relative: 15 %
Lymphs Abs: 0.7 10*3/uL (ref 0.7–4.0)
MCH: 30.6 pg (ref 26.0–34.0)
MCHC: 31.2 g/dL (ref 30.0–36.0)
MCV: 98 fL (ref 80.0–100.0)
Monocytes Absolute: 0.5 10*3/uL (ref 0.1–1.0)
Monocytes Relative: 10 %
Neutro Abs: 3.3 10*3/uL (ref 1.7–7.7)
Neutrophils Relative %: 72 %
Platelet Count: 71 10*3/uL — ABNORMAL LOW (ref 150–400)
RBC: 2.55 MIL/uL — ABNORMAL LOW (ref 4.22–5.81)
RDW: 20.9 % — ABNORMAL HIGH (ref 11.5–15.5)
WBC Count: 4.6 10*3/uL (ref 4.0–10.5)
nRBC: 0 % (ref 0.0–0.2)

## 2022-02-21 LAB — CMP (CANCER CENTER ONLY)
ALT: 10 U/L (ref 0–44)
AST: 31 U/L (ref 15–41)
Albumin: 3.1 g/dL — ABNORMAL LOW (ref 3.5–5.0)
Alkaline Phosphatase: 83 U/L (ref 38–126)
Anion gap: 4 — ABNORMAL LOW (ref 5–15)
BUN: 25 mg/dL — ABNORMAL HIGH (ref 8–23)
CO2: 25 mmol/L (ref 22–32)
Calcium: 9.1 mg/dL (ref 8.9–10.3)
Chloride: 110 mmol/L (ref 98–111)
Creatinine: 1.78 mg/dL — ABNORMAL HIGH (ref 0.61–1.24)
GFR, Estimated: 41 mL/min — ABNORMAL LOW (ref >60)
Glucose, Bld: 83 mg/dL (ref 70–99)
Potassium: 4.1 mmol/L (ref 3.5–5.1)
Sodium: 139 mmol/L (ref 135–145)
Total Bilirubin: 3.8 mg/dL (ref 0.3–1.2)
Total Protein: 7.2 g/dL (ref 6.5–8.1)

## 2022-02-21 LAB — FERRITIN: Ferritin: 185 ng/mL (ref 24–336)

## 2022-02-21 LAB — IRON AND IRON BINDING CAPACITY (CC-WL,HP ONLY)
Iron: 55 ug/dL (ref 45–182)
Saturation Ratios: 17 % — ABNORMAL LOW (ref 17.9–39.5)
TIBC: 318 ug/dL (ref 250–450)
UIBC: 263 ug/dL (ref 117–376)

## 2022-02-21 LAB — VITAMIN B12: Vitamin B-12: 1462 pg/mL — ABNORMAL HIGH (ref 180–914)

## 2022-02-21 LAB — FOLATE: Folate: 17 ng/mL (ref >5.9)

## 2022-02-21 MED ORDER — EPOETIN ALFA-EPBX 40000 UNIT/ML IJ SOLN
40000.0000 [IU] | Freq: Once | INTRAMUSCULAR | Status: AC
  Administered 2022-02-21: 40000 [IU] via SUBCUTANEOUS
  Filled 2022-02-21: qty 1

## 2022-02-21 MED ORDER — EPOETIN ALFA-EPBX 20000 UNIT/ML IJ SOLN
20000.0000 [IU] | Freq: Once | INTRAMUSCULAR | Status: AC
  Administered 2022-02-21: 20000 [IU] via SUBCUTANEOUS
  Filled 2022-02-21: qty 1

## 2022-02-21 NOTE — Patient Instructions (Signed)
Epoetin Alfa injection ?What is this medication? ?EPOETIN ALFA (e POE e tin AL fa) helps your body make more red blood cells. This medicine is used to treat anemia caused by chronic kidney disease, cancer chemotherapy, or HIV-therapy. It may also be used before surgery if you have anemia. ?This medicine may be used for other purposes; ask your health care provider or pharmacist if you have questions. ?COMMON BRAND NAME(S): Epogen, Procrit, Retacrit ?What should I tell my care team before I take this medication? ?They need to know if you have any of these conditions: ?cancer ?heart disease ?high blood pressure ?history of blood clots ?history of stroke ?low levels of folate, iron, or vitamin B12 in the blood ?seizures ?an unusual or allergic reaction to erythropoietin, albumin, benzyl alcohol, hamster proteins, other medicines, foods, dyes, or preservatives ?pregnant or trying to get pregnant ?breast-feeding ?How should I use this medication? ?This medicine is for injection into a vein or under the skin. It is usually given by a health care professional in a hospital or clinic setting. ?If you get this medicine at home, you will be taught how to prepare and give this medicine. Use exactly as directed. Take your medicine at regular intervals. Do not take your medicine more often than directed. ?It is important that you put your used needles and syringes in a special sharps container. Do not put them in a trash can. If you do not have a sharps container, call your pharmacist or healthcare provider to get one. ?A special MedGuide will be given to you by the pharmacist with each prescription and refill. Be sure to read this information carefully each time. ?Talk to your pediatrician regarding the use of this medicine in children. While this drug may be prescribed for selected conditions, precautions do apply. ?Overdosage: If you think you have taken too much of this medicine contact a poison control center or emergency  room at once. ?NOTE: This medicine is only for you. Do not share this medicine with others. ?What if I miss a dose? ?If you miss a dose, take it as soon as you can. If it is almost time for your next dose, take only that dose. Do not take double or extra doses. ?What may interact with this medication? ?Interactions have not been studied. ?This list may not describe all possible interactions. Give your health care provider a list of all the medicines, herbs, non-prescription drugs, or dietary supplements you use. Also tell them if you smoke, drink alcohol, or use illegal drugs. Some items may interact with your medicine. ?What should I watch for while using this medication? ?Your condition will be monitored carefully while you are receiving this medicine. ?You may need blood work done while you are taking this medicine. ?This medicine may cause a decrease in vitamin B6. You should make sure that you get enough vitamin B6 while you are taking this medicine. Discuss the foods you eat and the vitamins you take with your health care professional. ?What side effects may I notice from receiving this medication? ?Side effects that you should report to your doctor or health care professional as soon as possible: ?allergic reactions like skin rash, itching or hives, swelling of the face, lips, or tongue ?seizures ?signs and symptoms of a blood clot such as breathing problems; changes in vision; chest pain; severe, sudden headache; pain, swelling, warmth in the leg; trouble speaking; sudden numbness or weakness of the face, arm or leg ?signs and symptoms of a stroke like   changes in vision; confusion; trouble speaking or understanding; severe headaches; sudden numbness or weakness of the face, arm or leg; trouble walking; dizziness; loss of balance or coordination ?Side effects that usually do not require medical attention (report to your doctor or health care professional if they continue or are  bothersome): ?chills ?cough ?dizziness ?fever ?headaches ?joint pain ?muscle cramps ?muscle pain ?nausea, vomiting ?pain, redness, or irritation at site where injected ?This list may not describe all possible side effects. Call your doctor for medical advice about side effects. You may report side effects to FDA at 1-800-FDA-1088. ?Where should I keep my medication? ?Keep out of the reach of children. ?Store in a refrigerator between 2 and 8 degrees C (36 and 46 degrees F). Do not freeze or shake. Throw away any unused portion if using a single-dose vial. Multi-dose vials can be kept in the refrigerator for up to 21 days after the initial dose. Throw away unused medicine. ?NOTE: This sheet is a summary. It may not cover all possible information. If you have questions about this medicine, talk to your doctor, pharmacist, or health care provider. ?? 2023 Elsevier/Gold Standard (2017-04-03 00:00:00) ? ?

## 2022-02-28 DIAGNOSIS — Z96641 Presence of right artificial hip joint: Secondary | ICD-10-CM | POA: Diagnosis not present

## 2022-03-06 ENCOUNTER — Other Ambulatory Visit: Payer: Self-pay

## 2022-03-06 ENCOUNTER — Inpatient Hospital Stay: Payer: Medicare Other

## 2022-03-06 ENCOUNTER — Other Ambulatory Visit: Payer: Self-pay | Admitting: Pharmacist

## 2022-03-06 ENCOUNTER — Telehealth: Payer: Self-pay | Admitting: *Deleted

## 2022-03-06 VITALS — BP 124/59 | HR 84 | Temp 98.7°F | Resp 16

## 2022-03-06 DIAGNOSIS — D4620 Refractory anemia with excess of blasts, unspecified: Secondary | ICD-10-CM | POA: Diagnosis not present

## 2022-03-06 DIAGNOSIS — Z79899 Other long term (current) drug therapy: Secondary | ICD-10-CM | POA: Diagnosis not present

## 2022-03-06 LAB — CBC WITH DIFFERENTIAL (CANCER CENTER ONLY)
Abs Immature Granulocytes: 0.01 10*3/uL (ref 0.00–0.07)
Basophils Absolute: 0 10*3/uL (ref 0.0–0.1)
Basophils Relative: 0 %
Eosinophils Absolute: 0.1 10*3/uL (ref 0.0–0.5)
Eosinophils Relative: 2 %
HCT: 27 % — ABNORMAL LOW (ref 39.0–52.0)
Hemoglobin: 8.6 g/dL — ABNORMAL LOW (ref 13.0–17.0)
Immature Granulocytes: 0 %
Lymphocytes Relative: 13 %
Lymphs Abs: 0.7 10*3/uL (ref 0.7–4.0)
MCH: 30.6 pg (ref 26.0–34.0)
MCHC: 31.9 g/dL (ref 30.0–36.0)
MCV: 96.1 fL (ref 80.0–100.0)
Monocytes Absolute: 0.4 10*3/uL (ref 0.1–1.0)
Monocytes Relative: 8 %
Neutro Abs: 4.2 10*3/uL (ref 1.7–7.7)
Neutrophils Relative %: 77 %
Platelet Count: 87 10*3/uL — ABNORMAL LOW (ref 150–400)
RBC: 2.81 MIL/uL — ABNORMAL LOW (ref 4.22–5.81)
RDW: 20.9 % — ABNORMAL HIGH (ref 11.5–15.5)
WBC Count: 5.5 10*3/uL (ref 4.0–10.5)
nRBC: 0 % (ref 0.0–0.2)

## 2022-03-06 LAB — CMP (CANCER CENTER ONLY)
ALT: 10 U/L (ref 0–44)
AST: 27 U/L (ref 15–41)
Albumin: 3.1 g/dL — ABNORMAL LOW (ref 3.5–5.0)
Alkaline Phosphatase: 76 U/L (ref 38–126)
Anion gap: 6 (ref 5–15)
BUN: 24 mg/dL — ABNORMAL HIGH (ref 8–23)
CO2: 25 mmol/L (ref 22–32)
Calcium: 9.1 mg/dL (ref 8.9–10.3)
Chloride: 109 mmol/L (ref 98–111)
Creatinine: 1.64 mg/dL — ABNORMAL HIGH (ref 0.61–1.24)
GFR, Estimated: 45 mL/min — ABNORMAL LOW (ref >60)
Glucose, Bld: 102 mg/dL — ABNORMAL HIGH (ref 70–99)
Potassium: 4.1 mmol/L (ref 3.5–5.1)
Sodium: 140 mmol/L (ref 135–145)
Total Bilirubin: 5 mg/dL (ref 0.3–1.2)
Total Protein: 7 g/dL (ref 6.5–8.1)

## 2022-03-06 MED ORDER — EPOETIN ALFA-EPBX 20000 UNIT/ML IJ SOLN
20000.0000 [IU] | Freq: Once | INTRAMUSCULAR | Status: AC
  Administered 2022-03-06: 20000 [IU] via SUBCUTANEOUS
  Filled 2022-03-06: qty 1

## 2022-03-06 MED ORDER — EPOETIN ALFA-EPBX 40000 UNIT/ML IJ SOLN
40000.0000 [IU] | Freq: Once | INTRAMUSCULAR | Status: AC
  Administered 2022-03-06: 40000 [IU] via SUBCUTANEOUS
  Filled 2022-03-06: qty 1

## 2022-03-06 NOTE — Telephone Encounter (Signed)
CRITICAL VALUE STICKER  CRITICAL VALUE: T. Bili  5.0  RECEIVER (on-site recipient of call): Drucie Ip, RN  DATE & TIME NOTIFIED: [phone]  MESSENGER (representative from lab): Deidre Ala  MD NOTIFIED: Dr. Lorenso Courier  TIME OF NOTIFICATION: 5183  RESPONSE:  MD acknowledged receipt of lab. No intervention needed.

## 2022-03-06 NOTE — Patient Instructions (Signed)
Epoetin Alfa injection ?What is this medication? ?EPOETIN ALFA (e POE e tin AL fa) helps your body make more red blood cells. This medicine is used to treat anemia caused by chronic kidney disease, cancer chemotherapy, or HIV-therapy. It may also be used before surgery if you have anemia. ?This medicine may be used for other purposes; ask your health care provider or pharmacist if you have questions. ?COMMON BRAND NAME(S): Epogen, Procrit, Retacrit ?What should I tell my care team before I take this medication? ?They need to know if you have any of these conditions: ?cancer ?heart disease ?high blood pressure ?history of blood clots ?history of stroke ?low levels of folate, iron, or vitamin B12 in the blood ?seizures ?an unusual or allergic reaction to erythropoietin, albumin, benzyl alcohol, hamster proteins, other medicines, foods, dyes, or preservatives ?pregnant or trying to get pregnant ?breast-feeding ?How should I use this medication? ?This medicine is for injection into a vein or under the skin. It is usually given by a health care professional in a hospital or clinic setting. ?If you get this medicine at home, you will be taught how to prepare and give this medicine. Use exactly as directed. Take your medicine at regular intervals. Do not take your medicine more often than directed. ?It is important that you put your used needles and syringes in a special sharps container. Do not put them in a trash can. If you do not have a sharps container, call your pharmacist or healthcare provider to get one. ?A special MedGuide will be given to you by the pharmacist with each prescription and refill. Be sure to read this information carefully each time. ?Talk to your pediatrician regarding the use of this medicine in children. While this drug may be prescribed for selected conditions, precautions do apply. ?Overdosage: If you think you have taken too much of this medicine contact a poison control center or emergency  room at once. ?NOTE: This medicine is only for you. Do not share this medicine with others. ?What if I miss a dose? ?If you miss a dose, take it as soon as you can. If it is almost time for your next dose, take only that dose. Do not take double or extra doses. ?What may interact with this medication? ?Interactions have not been studied. ?This list may not describe all possible interactions. Give your health care provider a list of all the medicines, herbs, non-prescription drugs, or dietary supplements you use. Also tell them if you smoke, drink alcohol, or use illegal drugs. Some items may interact with your medicine. ?What should I watch for while using this medication? ?Your condition will be monitored carefully while you are receiving this medicine. ?You may need blood work done while you are taking this medicine. ?This medicine may cause a decrease in vitamin B6. You should make sure that you get enough vitamin B6 while you are taking this medicine. Discuss the foods you eat and the vitamins you take with your health care professional. ?What side effects may I notice from receiving this medication? ?Side effects that you should report to your doctor or health care professional as soon as possible: ?allergic reactions like skin rash, itching or hives, swelling of the face, lips, or tongue ?seizures ?signs and symptoms of a blood clot such as breathing problems; changes in vision; chest pain; severe, sudden headache; pain, swelling, warmth in the leg; trouble speaking; sudden numbness or weakness of the face, arm or leg ?signs and symptoms of a stroke like   changes in vision; confusion; trouble speaking or understanding; severe headaches; sudden numbness or weakness of the face, arm or leg; trouble walking; dizziness; loss of balance or coordination ?Side effects that usually do not require medical attention (report to your doctor or health care professional if they continue or are  bothersome): ?chills ?cough ?dizziness ?fever ?headaches ?joint pain ?muscle cramps ?muscle pain ?nausea, vomiting ?pain, redness, or irritation at site where injected ?This list may not describe all possible side effects. Call your doctor for medical advice about side effects. You may report side effects to FDA at 1-800-FDA-1088. ?Where should I keep my medication? ?Keep out of the reach of children. ?Store in a refrigerator between 2 and 8 degrees C (36 and 46 degrees F). Do not freeze or shake. Throw away any unused portion if using a single-dose vial. Multi-dose vials can be kept in the refrigerator for up to 21 days after the initial dose. Throw away unused medicine. ?NOTE: This sheet is a summary. It may not cover all possible information. If you have questions about this medicine, talk to your doctor, pharmacist, or health care provider. ?? 2023 Elsevier/Gold Standard (2017-04-03 00:00:00) ? ?

## 2022-03-20 ENCOUNTER — Other Ambulatory Visit: Payer: Self-pay | Admitting: Hematology and Oncology

## 2022-03-20 ENCOUNTER — Ambulatory Visit: Payer: Medicare Other

## 2022-03-20 ENCOUNTER — Other Ambulatory Visit: Payer: Self-pay | Admitting: *Deleted

## 2022-03-20 ENCOUNTER — Inpatient Hospital Stay: Payer: Medicare Other | Attending: Hematology and Oncology

## 2022-03-20 ENCOUNTER — Inpatient Hospital Stay (HOSPITAL_BASED_OUTPATIENT_CLINIC_OR_DEPARTMENT_OTHER): Payer: Medicare Other | Admitting: Hematology and Oncology

## 2022-03-20 ENCOUNTER — Other Ambulatory Visit: Payer: Self-pay

## 2022-03-20 ENCOUNTER — Ambulatory Visit: Payer: Medicare Other | Admitting: Hematology and Oncology

## 2022-03-20 ENCOUNTER — Inpatient Hospital Stay: Payer: Medicare Other

## 2022-03-20 ENCOUNTER — Other Ambulatory Visit: Payer: Medicare Other

## 2022-03-20 VITALS — BP 139/68 | HR 89 | Temp 98.7°F | Resp 16 | Wt 144.3 lb

## 2022-03-20 DIAGNOSIS — R7989 Other specified abnormal findings of blood chemistry: Secondary | ICD-10-CM

## 2022-03-20 DIAGNOSIS — D4620 Refractory anemia with excess of blasts, unspecified: Secondary | ICD-10-CM | POA: Insufficient documentation

## 2022-03-20 DIAGNOSIS — K7460 Unspecified cirrhosis of liver: Secondary | ICD-10-CM | POA: Insufficient documentation

## 2022-03-20 DIAGNOSIS — D696 Thrombocytopenia, unspecified: Secondary | ICD-10-CM | POA: Diagnosis not present

## 2022-03-20 DIAGNOSIS — D539 Nutritional anemia, unspecified: Secondary | ICD-10-CM

## 2022-03-20 DIAGNOSIS — Z79899 Other long term (current) drug therapy: Secondary | ICD-10-CM | POA: Diagnosis not present

## 2022-03-20 DIAGNOSIS — D469 Myelodysplastic syndrome, unspecified: Secondary | ICD-10-CM | POA: Diagnosis not present

## 2022-03-20 LAB — CBC WITH DIFFERENTIAL (CANCER CENTER ONLY)
Abs Immature Granulocytes: 0.02 10*3/uL (ref 0.00–0.07)
Basophils Absolute: 0 10*3/uL (ref 0.0–0.1)
Basophils Relative: 0 %
Eosinophils Absolute: 0.1 10*3/uL (ref 0.0–0.5)
Eosinophils Relative: 2 %
HCT: 25.2 % — ABNORMAL LOW (ref 39.0–52.0)
Hemoglobin: 8.2 g/dL — ABNORMAL LOW (ref 13.0–17.0)
Immature Granulocytes: 0 %
Lymphocytes Relative: 16 %
Lymphs Abs: 0.8 10*3/uL (ref 0.7–4.0)
MCH: 30.9 pg (ref 26.0–34.0)
MCHC: 32.5 g/dL (ref 30.0–36.0)
MCV: 95.1 fL (ref 80.0–100.0)
Monocytes Absolute: 0.4 10*3/uL (ref 0.1–1.0)
Monocytes Relative: 8 %
Neutro Abs: 3.7 10*3/uL (ref 1.7–7.7)
Neutrophils Relative %: 74 %
Platelet Count: 94 10*3/uL — ABNORMAL LOW (ref 150–400)
RBC: 2.65 MIL/uL — ABNORMAL LOW (ref 4.22–5.81)
RDW: 21.1 % — ABNORMAL HIGH (ref 11.5–15.5)
WBC Count: 4.9 10*3/uL (ref 4.0–10.5)
nRBC: 0 % (ref 0.0–0.2)

## 2022-03-20 LAB — CMP (CANCER CENTER ONLY)
ALT: 14 U/L (ref 0–44)
AST: 31 U/L (ref 15–41)
Albumin: 2.9 g/dL — ABNORMAL LOW (ref 3.5–5.0)
Alkaline Phosphatase: 73 U/L (ref 38–126)
Anion gap: 6 (ref 5–15)
BUN: 28 mg/dL — ABNORMAL HIGH (ref 8–23)
CO2: 20 mmol/L — ABNORMAL LOW (ref 22–32)
Calcium: 9.3 mg/dL (ref 8.9–10.3)
Chloride: 113 mmol/L — ABNORMAL HIGH (ref 98–111)
Creatinine: 1.76 mg/dL — ABNORMAL HIGH (ref 0.61–1.24)
GFR, Estimated: 42 mL/min — ABNORMAL LOW (ref >60)
Glucose, Bld: 92 mg/dL (ref 70–99)
Potassium: 4.1 mmol/L (ref 3.5–5.1)
Sodium: 139 mmol/L (ref 135–145)
Total Bilirubin: 4.3 mg/dL (ref 0.3–1.2)
Total Protein: 7 g/dL (ref 6.5–8.1)

## 2022-03-20 MED ORDER — OXYCODONE HCL 5 MG PO TABS: 5.0000 mg | ORAL_TABLET | ORAL | 0 refills | Status: DC | PRN

## 2022-03-20 MED ORDER — EPOETIN ALFA-EPBX 40000 UNIT/ML IJ SOLN
40000.0000 [IU] | Freq: Once | INTRAMUSCULAR | Status: AC
  Administered 2022-03-20: 40000 [IU] via SUBCUTANEOUS
  Filled 2022-03-20: qty 1

## 2022-03-20 MED ORDER — EPOETIN ALFA-EPBX 20000 UNIT/ML IJ SOLN
20000.0000 [IU] | Freq: Once | INTRAMUSCULAR | Status: AC
  Administered 2022-03-20: 20000 [IU] via SUBCUTANEOUS
  Filled 2022-03-20: qty 1
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
## 2022-03-21 ENCOUNTER — Encounter: Payer: Self-pay | Admitting: Hematology and Oncology

## 2022-03-21 NOTE — Progress Notes (Signed)
Patient's sister returned my call while on a webex meeting.  Returned call back to listen to concerns. Advised her of the outcome regarding patient's copay assistance program and how it is covering the patient's portion of treatment after insurance pays. Advised if there are open funds next year when his grant expires, he will be able to reapply at that time. She verbalized understanding and will explain to patient. Advised  I will attempt to locate a copy of the approval letter and provide via email for their records. She was very Patent attorney.  She has my contact name and number for any additional financial questions or concerns.

## 2022-03-21 NOTE — Progress Notes (Signed)
Located a copy of the approval letter for copay assistance through [employer].  Emailed a copy to patient's sister for his records.

## 2022-03-21 NOTE — Progress Notes (Signed)
Received staff message regarding concern from pt's sister with cost of injection and not being able to afford.  Reviewed pt's chart and he has assistance through [employer] which he is not getting billed for injection. Confirmed w/ Lenise whom works Adult nurse.  Called pt's sister(Mary) and left voicemail with my contact name and number for follow up of [phone].

## 2022-03-22 ENCOUNTER — Inpatient Hospital Stay: Payer: Medicare Other | Admitting: Licensed Clinical Social Worker

## 2022-03-22 NOTE — Progress Notes (Signed)
[facility]  Initial Assessment   [name] is a 66 y.o. year old male contacted caregiver by phone. Clinical Social Work was referred by medical provider for assessment of psychosocial needs.   SDOH (Social Determinants of Health) assessments performed: Yes SDOH Interventions    Flowsheet Row Most Recent Value  SDOH Interventions   Food Insecurity Interventions Other (Comment)  Housing Interventions Intervention Not Indicated  Transportation Interventions Intervention Not Indicated       SDOH Screenings   Alcohol Screen: Not on file  Depression (IZX2-8): Not on file  Financial Resource Strain: Not on file  Food Insecurity: Food Insecurity Present (03/22/2022)   Hunger Vital Sign    Worried About Running Out of Food in the Last Year: Sometimes true    Ran Out of Food in the Last Year: Sometimes true  Housing: Low Risk  (03/22/2022)   Housing    Last Housing Risk Score: 0  Physical Activity: Not on file  Social Connections: Not on file  Stress: Not on file  Tobacco Use: Medium Risk (02/20/2022)   Patient History    Smoking Tobacco Use: Former    Smokeless Tobacco Use: Never    Passive Exposure: Not on file  Transportation Needs: No Transportation Needs (03/22/2022)   PRAPARE - Hydrologist (Medical): No    Lack of Transportation (Non-Medical): No     Distress Screen completed: No     No data to display            Family/Social Information:  Housing Arrangement: patient lives alone. Family members/support persons in your life? Family Transportation concerns: no.  His sister provides transportation. Employment: Retired  Income source: Paediatric nurse concerns: Yes, current concerns Type of concern: Building control surveyor access concerns: yes Religious or spiritual practice: Not known Services Currently in place:  Medicare  Coping/ Adjustment to diagnosis: Patient understands treatment plan and what happens  next? yes Concerns about diagnosis and/or treatment: How I will pay for the services I need.  [name] has clarified with [name] that patients injections are covered through a grant. Patient reported stressors: Food Hopes and/or priorities: To get basic needs met. Patient enjoys time with family/ friends Current coping skills/ strengths: Supportive family/friends     SUMMARY: Current SDOH Barriers:  Limited access to food  Clinical Social Work Clinical Goal(s):  Explore community resource options for unmet needs related to:  Food Insecurity   Interventions: Discussed common feeling and emotions when being diagnosed with cancer, and the importance of support during treatment Informed patient of the support team roles and support services at [facility] Provided [place] contact information and encouraged patient to call with any questions or concerns Referred patient to Meals on Wheels and emailed patient's sister [name] ([email or web address] local food pantry list.   Follow Up Plan: CSW will follow-up with patient by phone  Caregiver verbalizes understanding of plan: Yes    Brandon Pickle [name], LCSW

## 2022-04-03 ENCOUNTER — Telehealth: Payer: Self-pay | Admitting: *Deleted

## 2022-04-03 ENCOUNTER — Ambulatory Visit: Payer: Medicare Other

## 2022-04-03 ENCOUNTER — Inpatient Hospital Stay: Payer: Medicare Other

## 2022-04-03 ENCOUNTER — Other Ambulatory Visit: Payer: Self-pay

## 2022-04-03 ENCOUNTER — Telehealth: Payer: Self-pay

## 2022-04-03 ENCOUNTER — Other Ambulatory Visit: Payer: Medicare Other

## 2022-04-03 VITALS — BP 128/62 | HR 89 | Temp 98.5°F | Resp 18

## 2022-04-03 DIAGNOSIS — D469 Myelodysplastic syndrome, unspecified: Secondary | ICD-10-CM | POA: Diagnosis not present

## 2022-04-03 DIAGNOSIS — D4620 Refractory anemia with excess of blasts, unspecified: Secondary | ICD-10-CM

## 2022-04-03 DIAGNOSIS — D539 Nutritional anemia, unspecified: Secondary | ICD-10-CM | POA: Diagnosis not present

## 2022-04-03 DIAGNOSIS — Z79899 Other long term (current) drug therapy: Secondary | ICD-10-CM | POA: Diagnosis not present

## 2022-04-03 DIAGNOSIS — D696 Thrombocytopenia, unspecified: Secondary | ICD-10-CM | POA: Diagnosis not present

## 2022-04-03 DIAGNOSIS — K7460 Unspecified cirrhosis of liver: Secondary | ICD-10-CM | POA: Diagnosis not present

## 2022-04-03 DIAGNOSIS — K7030 Alcoholic cirrhosis of liver without ascites: Secondary | ICD-10-CM

## 2022-04-03 LAB — CMP (CANCER CENTER ONLY)
ALT: 9 U/L (ref 0–44)
AST: 27 U/L (ref 15–41)
Albumin: 3.1 g/dL — ABNORMAL LOW (ref 3.5–5.0)
Alkaline Phosphatase: 77 U/L (ref 38–126)
Anion gap: 5 (ref 5–15)
BUN: 18 mg/dL (ref 8–23)
CO2: 24 mmol/L (ref 22–32)
Calcium: 9.4 mg/dL (ref 8.9–10.3)
Chloride: 110 mmol/L (ref 98–111)
Creatinine: 1.66 mg/dL — ABNORMAL HIGH (ref 0.61–1.24)
GFR, Estimated: 45 mL/min — ABNORMAL LOW (ref >60)
Glucose, Bld: 97 mg/dL (ref 70–99)
Potassium: 3.8 mmol/L (ref 3.5–5.1)
Sodium: 139 mmol/L (ref 135–145)
Total Bilirubin: 6.3 mg/dL (ref 0.3–1.2)
Total Protein: 6.9 g/dL (ref 6.5–8.1)

## 2022-04-03 LAB — CBC WITH DIFFERENTIAL (CANCER CENTER ONLY)
Abs Immature Granulocytes: 0.02 10*3/uL (ref 0.00–0.07)
Basophils Absolute: 0 10*3/uL (ref 0.0–0.1)
Basophils Relative: 0 %
Eosinophils Absolute: 0.1 10*3/uL (ref 0.0–0.5)
Eosinophils Relative: 2 %
HCT: 27.3 % — ABNORMAL LOW (ref 39.0–52.0)
Hemoglobin: 8.8 g/dL — ABNORMAL LOW (ref 13.0–17.0)
Immature Granulocytes: 0 %
Lymphocytes Relative: 13 %
Lymphs Abs: 0.6 10*3/uL — ABNORMAL LOW (ref 0.7–4.0)
MCH: 31 pg (ref 26.0–34.0)
MCHC: 32.2 g/dL (ref 30.0–36.0)
MCV: 96.1 fL (ref 80.0–100.0)
Monocytes Absolute: 0.3 10*3/uL (ref 0.1–1.0)
Monocytes Relative: 7 %
Neutro Abs: 3.7 10*3/uL (ref 1.7–7.7)
Neutrophils Relative %: 78 %
Platelet Count: 78 10*3/uL — ABNORMAL LOW (ref 150–400)
RBC: 2.84 MIL/uL — ABNORMAL LOW (ref 4.22–5.81)
RDW: 21.5 % — ABNORMAL HIGH (ref 11.5–15.5)
WBC Count: 4.8 10*3/uL (ref 4.0–10.5)
nRBC: 0 % (ref 0.0–0.2)

## 2022-04-03 MED ORDER — EPOETIN ALFA-EPBX 40000 UNIT/ML IJ SOLN
40000.0000 [IU] | Freq: Once | INTRAMUSCULAR | Status: AC
  Administered 2022-04-03: 40000 [IU] via SUBCUTANEOUS
  Filled 2022-04-03: qty 1

## 2022-04-03 MED ORDER — EPOETIN ALFA-EPBX 20000 UNIT/ML IJ SOLN
20000.0000 [IU] | Freq: Once | INTRAMUSCULAR | Status: AC
  Administered 2022-04-03: 20000 [IU] via SUBCUTANEOUS
  Filled 2022-04-03: qty 1

## 2022-04-03 NOTE — Telephone Encounter (Signed)
-----   Message from Daryel November, MD sent at 04/03/2022  2:46 PM EDT ----- His hyperbilirubinemia was indirect when last checked, and so is unlikely to be solely due to cirrhosis.  His liver enzymes are otherwise within normal limits and at baseline.  Therefore I do not think the increase in his bilirubin is secondary to an acute worsening of his liver function.  I would assess for other causes of indirect hyperbilirubinemia and recheck an INR which would provide an indicator of liver function.  Vaughan Basta,  Can you please order a fractionated bilirubin, haptoglobin, LDH level and INR panel for Mr. Albright?  Thanks   ----- Message ----- From: Otila Kluver, RN Sent: 04/03/2022   2:02 PM EDT To: Daryel November, MD  Pt's total bilirubin is going up. Please follow up and advise. Thank you. Drucie Ip, RN for Dr. Narda Rutherford

## 2022-04-03 NOTE — Telephone Encounter (Signed)
CRITICAL VALUE STICKER  CRITICAL VALUE: T. Bili 6.3  RECEIVER (on-site recipient of call): Drucie Ip, RN  DATE & TIME NOTIFIED: 04/03/22 2pm  MESSENGER (representative from lab): Pam  MD NOTIFIED: Dede Query, PA  TIME OF NOTIFICATION: 2:03 pm  RESPONSE:  Notified Dr. Dustin Flock @ [facility]

## 2022-04-03 NOTE — Telephone Encounter (Signed)
Lab orders entered as requested. Pt knows to come for labwork.

## 2022-04-05 DIAGNOSIS — F419 Anxiety disorder, unspecified: Secondary | ICD-10-CM | POA: Diagnosis not present

## 2022-04-05 DIAGNOSIS — Z79899 Other long term (current) drug therapy: Secondary | ICD-10-CM | POA: Diagnosis not present

## 2022-04-18 ENCOUNTER — Telehealth: Payer: Self-pay

## 2022-04-18 ENCOUNTER — Other Ambulatory Visit: Payer: Medicare Other

## 2022-04-18 ENCOUNTER — Ambulatory Visit: Payer: Medicare Other

## 2022-04-18 ENCOUNTER — Other Ambulatory Visit: Payer: Self-pay | Admitting: Gastroenterology

## 2022-04-18 ENCOUNTER — Inpatient Hospital Stay: Payer: Medicare Other | Attending: Hematology and Oncology

## 2022-04-18 ENCOUNTER — Inpatient Hospital Stay: Payer: Medicare Other

## 2022-04-18 VITALS — BP 134/65 | HR 93 | Temp 98.2°F | Resp 16

## 2022-04-18 DIAGNOSIS — Z87891 Personal history of nicotine dependence: Secondary | ICD-10-CM | POA: Insufficient documentation

## 2022-04-18 DIAGNOSIS — D696 Thrombocytopenia, unspecified: Secondary | ICD-10-CM | POA: Diagnosis not present

## 2022-04-18 DIAGNOSIS — M25559 Pain in unspecified hip: Secondary | ICD-10-CM | POA: Insufficient documentation

## 2022-04-18 DIAGNOSIS — D539 Nutritional anemia, unspecified: Secondary | ICD-10-CM | POA: Insufficient documentation

## 2022-04-18 DIAGNOSIS — D469 Myelodysplastic syndrome, unspecified: Secondary | ICD-10-CM | POA: Insufficient documentation

## 2022-04-18 DIAGNOSIS — D4620 Refractory anemia with excess of blasts, unspecified: Secondary | ICD-10-CM | POA: Diagnosis not present

## 2022-04-18 DIAGNOSIS — K7030 Alcoholic cirrhosis of liver without ascites: Secondary | ICD-10-CM | POA: Diagnosis not present

## 2022-04-18 DIAGNOSIS — K7460 Unspecified cirrhosis of liver: Secondary | ICD-10-CM | POA: Insufficient documentation

## 2022-04-18 DIAGNOSIS — Z79891 Long term (current) use of opiate analgesic: Secondary | ICD-10-CM | POA: Diagnosis not present

## 2022-04-18 DIAGNOSIS — Z79899 Other long term (current) drug therapy: Secondary | ICD-10-CM | POA: Insufficient documentation

## 2022-04-18 LAB — CMP (CANCER CENTER ONLY)
ALT: 11 U/L (ref 0–44)
AST: 32 U/L (ref 15–41)
Albumin: 3 g/dL — ABNORMAL LOW (ref 3.5–5.0)
Alkaline Phosphatase: 90 U/L (ref 38–126)
Anion gap: 5 (ref 5–15)
BUN: 24 mg/dL — ABNORMAL HIGH (ref 8–23)
CO2: 24 mmol/L (ref 22–32)
Calcium: 9.4 mg/dL (ref 8.9–10.3)
Chloride: 110 mmol/L (ref 98–111)
Creatinine: 1.59 mg/dL — ABNORMAL HIGH (ref 0.61–1.24)
GFR, Estimated: 47 mL/min — ABNORMAL LOW (ref >60)
Glucose, Bld: 99 mg/dL (ref 70–99)
Potassium: 4 mmol/L (ref 3.5–5.1)
Sodium: 139 mmol/L (ref 135–145)
Total Bilirubin: 5 mg/dL (ref 0.3–1.2)
Total Protein: 6.8 g/dL (ref 6.5–8.1)

## 2022-04-18 LAB — CBC WITH DIFFERENTIAL (CANCER CENTER ONLY)
Abs Immature Granulocytes: 0.01 10*3/uL (ref 0.00–0.07)
Basophils Absolute: 0 10*3/uL (ref 0.0–0.1)
Basophils Relative: 1 %
Eosinophils Absolute: 0.1 10*3/uL (ref 0.0–0.5)
Eosinophils Relative: 1 %
HCT: 25.7 % — ABNORMAL LOW (ref 39.0–52.0)
Hemoglobin: 8.3 g/dL — ABNORMAL LOW (ref 13.0–17.0)
Immature Granulocytes: 0 %
Lymphocytes Relative: 16 %
Lymphs Abs: 0.6 10*3/uL — ABNORMAL LOW (ref 0.7–4.0)
MCH: 31.7 pg (ref 26.0–34.0)
MCHC: 32.3 g/dL (ref 30.0–36.0)
MCV: 98.1 fL (ref 80.0–100.0)
Monocytes Absolute: 0.3 10*3/uL (ref 0.1–1.0)
Monocytes Relative: 8 %
Neutro Abs: 2.8 10*3/uL (ref 1.7–7.7)
Neutrophils Relative %: 74 %
Platelet Count: 89 10*3/uL — ABNORMAL LOW (ref 150–400)
RBC: 2.62 MIL/uL — ABNORMAL LOW (ref 4.22–5.81)
RDW: 22 % — ABNORMAL HIGH (ref 11.5–15.5)
WBC Count: 3.8 10*3/uL — ABNORMAL LOW (ref 4.0–10.5)
nRBC: 0 % (ref 0.0–0.2)

## 2022-04-18 MED ORDER — EPOETIN ALFA-EPBX 40000 UNIT/ML IJ SOLN: 40000.0000 [IU] | Freq: Once | INTRAMUSCULAR | Status: DC

## 2022-04-18 MED ORDER — EPOETIN ALFA-EPBX 20000 UNIT/ML IJ SOLN
60000.0000 [IU] | Freq: Once | INTRAMUSCULAR | Status: AC
  Administered 2022-04-18: 60000 [IU] via SUBCUTANEOUS

## 2022-04-18 NOTE — Telephone Encounter (Signed)
CRITICAL VALUE STICKER  CRITICAL VALUE: Bilirubin 5.0 mg/dl  RECEIVER (on-site recipient of call): [place] NOTIFIED: 1515  MESSENGER (representative from lab): Rosann Auerbach  MD NOTIFIED: Dr. Lorenso Courier  TIME OF NOTIFICATION:1516  RESPONSE:  No intervention needed.

## 2022-04-18 NOTE — Patient Instructions (Signed)

## 2022-04-25 LAB — PROTIME-INR
INR: 1.3 — ABNORMAL HIGH (ref 0.9–1.2)
Prothrombin Time: 13.9 s — ABNORMAL HIGH (ref 9.1–12.0)

## 2022-04-25 LAB — LACTATE DEHYDROGENASE: LDH: 238 IU/L — ABNORMAL HIGH (ref 121–224)

## 2022-04-25 LAB — BILIRUBIN, FRACTIONATED(TOT/DIR/INDIR)
Bilirubin Total: 4.1 mg/dL — ABNORMAL HIGH (ref 0.0–1.2)
Bilirubin, Direct: 1.35 mg/dL — ABNORMAL HIGH (ref 0.00–0.40)
Bilirubin, Indirect: 2.75 mg/dL (ref 0.10–0.80)

## 2022-04-25 LAB — SPECIMEN STATUS REPORT

## 2022-04-25 LAB — HAPTOGLOBIN: Haptoglobin: <10 mg/dL — ABNORMAL LOW (ref 32–363)

## 2022-05-01 ENCOUNTER — Other Ambulatory Visit: Payer: Self-pay

## 2022-05-01 ENCOUNTER — Inpatient Hospital Stay (HOSPITAL_BASED_OUTPATIENT_CLINIC_OR_DEPARTMENT_OTHER): Payer: Medicare Other | Admitting: Hematology and Oncology

## 2022-05-01 ENCOUNTER — Ambulatory Visit: Payer: Medicare Other

## 2022-05-01 ENCOUNTER — Inpatient Hospital Stay: Payer: Medicare Other

## 2022-05-01 ENCOUNTER — Ambulatory Visit: Payer: Medicare Other | Admitting: Hematology and Oncology

## 2022-05-01 ENCOUNTER — Other Ambulatory Visit: Payer: Medicare Other

## 2022-05-01 VITALS — BP 150/64 | HR 96 | Temp 97.7°F | Resp 17 | Wt 144.4 lb

## 2022-05-01 DIAGNOSIS — D539 Nutritional anemia, unspecified: Secondary | ICD-10-CM

## 2022-05-01 DIAGNOSIS — K7460 Unspecified cirrhosis of liver: Secondary | ICD-10-CM | POA: Diagnosis not present

## 2022-05-01 DIAGNOSIS — D4620 Refractory anemia with excess of blasts, unspecified: Secondary | ICD-10-CM | POA: Diagnosis not present

## 2022-05-01 DIAGNOSIS — Z79899 Other long term (current) drug therapy: Secondary | ICD-10-CM | POA: Diagnosis not present

## 2022-05-01 DIAGNOSIS — D469 Myelodysplastic syndrome, unspecified: Secondary | ICD-10-CM | POA: Diagnosis not present

## 2022-05-01 DIAGNOSIS — D696 Thrombocytopenia, unspecified: Secondary | ICD-10-CM | POA: Diagnosis not present

## 2022-05-01 LAB — CMP (CANCER CENTER ONLY)
ALT: 12 U/L (ref 0–44)
AST: 36 U/L (ref 15–41)
Albumin: 3 g/dL — ABNORMAL LOW (ref 3.5–5.0)
Alkaline Phosphatase: 92 U/L (ref 38–126)
Anion gap: 8 (ref 5–15)
BUN: 21 mg/dL (ref 8–23)
CO2: 21 mmol/L — ABNORMAL LOW (ref 22–32)
Calcium: 9 mg/dL (ref 8.9–10.3)
Chloride: 113 mmol/L — ABNORMAL HIGH (ref 98–111)
Creatinine: 1.49 mg/dL — ABNORMAL HIGH (ref 0.61–1.24)
GFR, Estimated: 51 mL/min — ABNORMAL LOW (ref >60)
Glucose, Bld: 112 mg/dL — ABNORMAL HIGH (ref 70–99)
Potassium: 3.9 mmol/L (ref 3.5–5.1)
Sodium: 142 mmol/L (ref 135–145)
Total Bilirubin: 4.4 mg/dL (ref 0.3–1.2)
Total Protein: 6.8 g/dL (ref 6.5–8.1)

## 2022-05-01 LAB — CBC WITH DIFFERENTIAL (CANCER CENTER ONLY)
Abs Immature Granulocytes: 0.01 10*3/uL (ref 0.00–0.07)
Basophils Absolute: 0 10*3/uL (ref 0.0–0.1)
Basophils Relative: 1 %
Eosinophils Absolute: 0.1 10*3/uL (ref 0.0–0.5)
Eosinophils Relative: 2 %
HCT: 26.4 % — ABNORMAL LOW (ref 39.0–52.0)
Hemoglobin: 8.3 g/dL — ABNORMAL LOW (ref 13.0–17.0)
Immature Granulocytes: 0 %
Lymphocytes Relative: 14 %
Lymphs Abs: 0.6 10*3/uL — ABNORMAL LOW (ref 0.7–4.0)
MCH: 31.6 pg (ref 26.0–34.0)
MCHC: 31.4 g/dL (ref 30.0–36.0)
MCV: 100.4 fL — ABNORMAL HIGH (ref 80.0–100.0)
Monocytes Absolute: 0.3 10*3/uL (ref 0.1–1.0)
Monocytes Relative: 8 %
Neutro Abs: 3.2 10*3/uL (ref 1.7–7.7)
Neutrophils Relative %: 75 %
Platelet Count: 74 10*3/uL — ABNORMAL LOW (ref 150–400)
RBC: 2.63 MIL/uL — ABNORMAL LOW (ref 4.22–5.81)
RDW: 20.9 % — ABNORMAL HIGH (ref 11.5–15.5)
WBC Count: 4.2 10*3/uL (ref 4.0–10.5)
nRBC: 0 % (ref 0.0–0.2)

## 2022-05-01 LAB — RETIC PANEL
Immature Retic Fract: 13.1 % (ref 2.3–15.9)
RBC.: 2.64 MIL/uL — ABNORMAL LOW (ref 4.22–5.81)
Retic Count, Absolute: 65.5 10*3/uL (ref 19.0–186.0)
Retic Ct Pct: 2.5 % (ref 0.4–3.1)
Reticulocyte Hemoglobin: 32.6 pg (ref >27.9)

## 2022-05-01 MED ORDER — MIRTAZAPINE 15 MG PO TABS: 15.0000 mg | ORAL_TABLET | Freq: Every day | ORAL | 3 refills | Status: DC

## 2022-05-01 MED ORDER — EPOETIN ALFA-EPBX 20000 UNIT/ML IJ SOLN
20000.0000 [IU] | Freq: Once | INTRAMUSCULAR | Status: AC
  Administered 2022-05-01: 20000 [IU] via SUBCUTANEOUS
  Filled 2022-05-01: qty 1

## 2022-05-01 MED ORDER — OXYCODONE HCL 5 MG PO TABS: 5.0000 mg | ORAL_TABLET | ORAL | 0 refills | Status: DC | PRN

## 2022-05-01 MED ORDER — EPOETIN ALFA-EPBX 40000 UNIT/ML IJ SOLN
40000.0000 [IU] | Freq: Once | INTRAMUSCULAR | Status: AC
  Administered 2022-05-01: 40000 [IU] via SUBCUTANEOUS
  Filled 2022-05-01: qty 1

## 2022-05-01 NOTE — Patient Instructions (Signed)

## 2022-05-02 ENCOUNTER — Encounter: Payer: Self-pay | Admitting: Hematology and Oncology
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
## 2022-05-03 ENCOUNTER — Telehealth: Payer: Self-pay | Admitting: Hematology and Oncology

## 2022-05-03 NOTE — Telephone Encounter (Signed)
Per 9/18 los called and spoke to pt sister about appointments

## 2022-05-15 ENCOUNTER — Other Ambulatory Visit: Payer: Self-pay

## 2022-05-15 ENCOUNTER — Inpatient Hospital Stay: Payer: Medicare Other

## 2022-05-15 ENCOUNTER — Emergency Department (HOSPITAL_COMMUNITY): Payer: Medicare Other

## 2022-05-15 ENCOUNTER — Inpatient Hospital Stay (HOSPITAL_COMMUNITY): Payer: Medicare Other

## 2022-05-15 ENCOUNTER — Encounter (HOSPITAL_COMMUNITY): Payer: Self-pay

## 2022-05-15 ENCOUNTER — Inpatient Hospital Stay (HOSPITAL_COMMUNITY)
Admission: EM | Admit: 2022-05-15 | Discharge: 2022-05-18 | DRG: 441 | Disposition: A | Discharge status: Hospice | Payer: Medicare Other | Attending: Internal Medicine | Admitting: Internal Medicine

## 2022-05-15 DIAGNOSIS — R9431 Abnormal electrocardiogram [ECG] [EKG]: Secondary | ICD-10-CM | POA: Diagnosis not present

## 2022-05-15 DIAGNOSIS — N1832 Chronic kidney disease, stage 3b: Secondary | ICD-10-CM | POA: Diagnosis present

## 2022-05-15 DIAGNOSIS — Z96643 Presence of artificial hip joint, bilateral: Secondary | ICD-10-CM | POA: Diagnosis present

## 2022-05-15 DIAGNOSIS — E872 Acidosis, unspecified: Secondary | ICD-10-CM | POA: Diagnosis present

## 2022-05-15 DIAGNOSIS — R4182 Altered mental status, unspecified: Secondary | ICD-10-CM | POA: Diagnosis not present

## 2022-05-15 DIAGNOSIS — K7030 Alcoholic cirrhosis of liver without ascites: Secondary | ICD-10-CM | POA: Diagnosis present

## 2022-05-15 DIAGNOSIS — R7989 Other specified abnormal findings of blood chemistry: Secondary | ICD-10-CM | POA: Diagnosis not present

## 2022-05-15 DIAGNOSIS — Z885 Allergy status to narcotic agent status: Secondary | ICD-10-CM

## 2022-05-15 DIAGNOSIS — R64 Cachexia: Secondary | ICD-10-CM | POA: Diagnosis present

## 2022-05-15 DIAGNOSIS — I700 Atherosclerosis of aorta: Secondary | ICD-10-CM | POA: Diagnosis present

## 2022-05-15 DIAGNOSIS — D689 Coagulation defect, unspecified: Secondary | ICD-10-CM | POA: Diagnosis present

## 2022-05-15 DIAGNOSIS — F32A Depression, unspecified: Secondary | ICD-10-CM | POA: Diagnosis present

## 2022-05-15 DIAGNOSIS — W1830XA Fall on same level, unspecified, initial encounter: Secondary | ICD-10-CM | POA: Diagnosis present

## 2022-05-15 DIAGNOSIS — D61818 Other pancytopenia: Secondary | ICD-10-CM | POA: Diagnosis present

## 2022-05-15 DIAGNOSIS — K766 Portal hypertension: Secondary | ICD-10-CM | POA: Diagnosis present

## 2022-05-15 DIAGNOSIS — E876 Hypokalemia: Secondary | ICD-10-CM | POA: Diagnosis present

## 2022-05-15 DIAGNOSIS — Z6821 Body mass index (BMI) 21.0-21.9, adult: Secondary | ICD-10-CM

## 2022-05-15 DIAGNOSIS — Z66 Do not resuscitate: Secondary | ICD-10-CM | POA: Diagnosis present

## 2022-05-15 DIAGNOSIS — R410 Disorientation, unspecified: Secondary | ICD-10-CM | POA: Diagnosis not present

## 2022-05-15 DIAGNOSIS — K7682 Hepatic encephalopathy: Secondary | ICD-10-CM | POA: Diagnosis present

## 2022-05-15 DIAGNOSIS — F17210 Nicotine dependence, cigarettes, uncomplicated: Secondary | ICD-10-CM | POA: Diagnosis present

## 2022-05-15 DIAGNOSIS — G9340 Encephalopathy, unspecified: Principal | ICD-10-CM

## 2022-05-15 DIAGNOSIS — K7040 Alcoholic hepatic failure without coma: Secondary | ICD-10-CM | POA: Diagnosis present

## 2022-05-15 DIAGNOSIS — F419 Anxiety disorder, unspecified: Secondary | ICD-10-CM

## 2022-05-15 DIAGNOSIS — Z8042 Family history of malignant neoplasm of prostate: Secondary | ICD-10-CM

## 2022-05-15 DIAGNOSIS — I252 Old myocardial infarction: Secondary | ICD-10-CM

## 2022-05-15 DIAGNOSIS — W19XXXA Unspecified fall, initial encounter: Secondary | ICD-10-CM | POA: Diagnosis not present

## 2022-05-15 DIAGNOSIS — D649 Anemia, unspecified: Secondary | ICD-10-CM | POA: Diagnosis present

## 2022-05-15 DIAGNOSIS — R54 Age-related physical debility: Secondary | ICD-10-CM | POA: Diagnosis present

## 2022-05-15 DIAGNOSIS — Z1152 Encounter for screening for COVID-19: Secondary | ICD-10-CM

## 2022-05-15 DIAGNOSIS — K824 Cholesterolosis of gallbladder: Secondary | ICD-10-CM | POA: Diagnosis not present

## 2022-05-15 DIAGNOSIS — Z7401 Bed confinement status: Secondary | ICD-10-CM | POA: Diagnosis not present

## 2022-05-15 DIAGNOSIS — L89152 Pressure ulcer of sacral region, stage 2: Secondary | ICD-10-CM | POA: Diagnosis present

## 2022-05-15 DIAGNOSIS — R1084 Generalized abdominal pain: Secondary | ICD-10-CM | POA: Diagnosis not present

## 2022-05-15 DIAGNOSIS — G9341 Metabolic encephalopathy: Secondary | ICD-10-CM | POA: Diagnosis present

## 2022-05-15 DIAGNOSIS — R531 Weakness: Secondary | ICD-10-CM | POA: Diagnosis not present

## 2022-05-15 DIAGNOSIS — Z825 Family history of asthma and other chronic lower respiratory diseases: Secondary | ICD-10-CM

## 2022-05-15 DIAGNOSIS — D539 Nutritional anemia, unspecified: Secondary | ICD-10-CM | POA: Diagnosis present

## 2022-05-15 DIAGNOSIS — I959 Hypotension, unspecified: Secondary | ICD-10-CM | POA: Diagnosis not present

## 2022-05-15 DIAGNOSIS — D469 Myelodysplastic syndrome, unspecified: Secondary | ICD-10-CM | POA: Diagnosis present

## 2022-05-15 DIAGNOSIS — K219 Gastro-esophageal reflux disease without esophagitis: Secondary | ICD-10-CM | POA: Diagnosis present

## 2022-05-15 DIAGNOSIS — Z515 Encounter for palliative care: Secondary | ICD-10-CM

## 2022-05-15 DIAGNOSIS — R627 Adult failure to thrive: Secondary | ICD-10-CM | POA: Diagnosis present

## 2022-05-15 DIAGNOSIS — Z881 Allergy status to other antibiotic agents status: Secondary | ICD-10-CM

## 2022-05-15 DIAGNOSIS — Z888 Allergy status to other drugs, medicaments and biological substances status: Secondary | ICD-10-CM

## 2022-05-15 DIAGNOSIS — D696 Thrombocytopenia, unspecified: Secondary | ICD-10-CM | POA: Diagnosis present

## 2022-05-15 DIAGNOSIS — R17 Unspecified jaundice: Secondary | ICD-10-CM | POA: Diagnosis not present

## 2022-05-15 DIAGNOSIS — R509 Fever, unspecified: Secondary | ICD-10-CM | POA: Diagnosis not present

## 2022-05-15 DIAGNOSIS — R945 Abnormal results of liver function studies: Secondary | ICD-10-CM | POA: Diagnosis not present

## 2022-05-15 DIAGNOSIS — J439 Emphysema, unspecified: Secondary | ICD-10-CM | POA: Diagnosis present

## 2022-05-15 DIAGNOSIS — L8990 Pressure ulcer of unspecified site, unspecified stage: Secondary | ICD-10-CM | POA: Insufficient documentation

## 2022-05-15 DIAGNOSIS — N281 Cyst of kidney, acquired: Secondary | ICD-10-CM | POA: Diagnosis not present

## 2022-05-15 DIAGNOSIS — Z79899 Other long term (current) drug therapy: Secondary | ICD-10-CM

## 2022-05-15 DIAGNOSIS — Z7189 Other specified counseling: Secondary | ICD-10-CM | POA: Diagnosis not present

## 2022-05-15 LAB — COMPREHENSIVE METABOLIC PANEL
ALT: 20 U/L (ref 0–44)
AST: 57 U/L — ABNORMAL HIGH (ref 15–41)
Albumin: 2.8 g/dL — ABNORMAL LOW (ref 3.5–5.0)
Alkaline Phosphatase: 69 U/L (ref 38–126)
Anion gap: 8 (ref 5–15)
BUN: 30 mg/dL — ABNORMAL HIGH (ref 8–23)
CO2: 22 mmol/L (ref 22–32)
Calcium: 9.7 mg/dL (ref 8.9–10.3)
Chloride: 116 mmol/L — ABNORMAL HIGH (ref 98–111)
Creatinine, Ser: 1.63 mg/dL — ABNORMAL HIGH (ref 0.61–1.24)
GFR, Estimated: 46 mL/min — ABNORMAL LOW (ref >60)
Glucose, Bld: 92 mg/dL (ref 70–99)
Potassium: 3.8 mmol/L (ref 3.5–5.1)
Sodium: 146 mmol/L — ABNORMAL HIGH (ref 135–145)
Total Bilirubin: 7.3 mg/dL — ABNORMAL HIGH (ref 0.3–1.2)
Total Protein: 6.9 g/dL (ref 6.5–8.1)

## 2022-05-15 LAB — URINALYSIS, ROUTINE W REFLEX MICROSCOPIC
Bilirubin Urine: NEGATIVE
Glucose, UA: NEGATIVE mg/dL
Ketones, ur: NEGATIVE mg/dL
Leukocytes,Ua: NEGATIVE
Nitrite: NEGATIVE
Protein, ur: 30 mg/dL — AB
Specific Gravity, Urine: 1.013 (ref 1.005–1.030)
pH: 6 (ref 5.0–8.0)

## 2022-05-15 LAB — CBC WITH DIFFERENTIAL/PLATELET
Abs Immature Granulocytes: 0.02 10*3/uL (ref 0.00–0.07)
Basophils Absolute: 0 10*3/uL (ref 0.0–0.1)
Basophils Relative: 0 %
Eosinophils Absolute: 0.1 10*3/uL (ref 0.0–0.5)
Eosinophils Relative: 1 %
HCT: 29.2 % — ABNORMAL LOW (ref 39.0–52.0)
Hemoglobin: 8.8 g/dL — ABNORMAL LOW (ref 13.0–17.0)
Immature Granulocytes: 0 %
Lymphocytes Relative: 10 %
Lymphs Abs: 0.6 10*3/uL — ABNORMAL LOW (ref 0.7–4.0)
MCH: 31.2 pg (ref 26.0–34.0)
MCHC: 30.1 g/dL (ref 30.0–36.0)
MCV: 103.5 fL — ABNORMAL HIGH (ref 80.0–100.0)
Monocytes Absolute: 0.5 10*3/uL (ref 0.1–1.0)
Monocytes Relative: 9 %
Neutro Abs: 4.6 10*3/uL (ref 1.7–7.7)
Neutrophils Relative %: 80 %
Platelets: 86 10*3/uL — ABNORMAL LOW (ref 150–400)
RBC: 2.82 MIL/uL — ABNORMAL LOW (ref 4.22–5.81)
RDW: 20.7 % — ABNORMAL HIGH (ref 11.5–15.5)
WBC: 5.8 10*3/uL (ref 4.0–10.5)
nRBC: 0 % (ref 0.0–0.2)

## 2022-05-15 LAB — PROTIME-INR
INR: 2.4 — ABNORMAL HIGH (ref 0.8–1.2)
Prothrombin Time: 25.9 seconds — ABNORMAL HIGH (ref 11.4–15.2)

## 2022-05-15 LAB — LACTIC ACID, PLASMA
Lactic Acid, Venous: 2.6 mmol/L (ref 0.5–1.9)
Lactic Acid, Venous: >9 mmol/L (ref 0.5–1.9)
Lactic Acid, Venous: 1.4 mmol/L (ref 0.5–1.9)
Lactic Acid, Venous: 1.6 mmol/L (ref 0.5–1.9)
Lactic Acid, Venous: 1.7 mmol/L (ref 0.5–1.9)

## 2022-05-15 LAB — AMMONIA: Ammonia: 78 umol/L — ABNORMAL HIGH (ref 9–35)

## 2022-05-15 LAB — PROCALCITONIN: Procalcitonin: 0.18 ng/mL

## 2022-05-15 LAB — CBG MONITORING, ED: Glucose-Capillary: 85 mg/dL (ref 70–99)

## 2022-05-15 LAB — SARS CORONAVIRUS 2 BY RT PCR: SARS Coronavirus 2 by RT PCR: NEGATIVE

## 2022-05-15 LAB — CK: Total CK: 211 U/L (ref 49–397)

## 2022-05-15 LAB — APTT: aPTT: 47 seconds — ABNORMAL HIGH (ref 24–36)

## 2022-05-15 MED ORDER — VANCOMYCIN HCL IN DEXTROSE 1-5 GM/200ML-% IV SOLN
1000.0000 mg | Freq: Once | INTRAVENOUS | Status: AC
  Administered 2022-05-15: 1000 mg via INTRAVENOUS
  Filled 2022-05-15: qty 200

## 2022-05-15 MED ORDER — ONDANSETRON HCL 4 MG PO TABS
4.0000 mg | ORAL_TABLET | Freq: Four times a day (QID) | ORAL | Status: DC | PRN
  Administered 2022-05-18: 4 mg via ORAL
  Filled 2022-05-15: qty 1

## 2022-05-15 MED ORDER — LACTATED RINGERS IV BOLUS
1000.0000 mL | Freq: Once | INTRAVENOUS | Status: AC
  Administered 2022-05-15: 1000 mL via INTRAVENOUS

## 2022-05-15 MED ORDER — LACTATED RINGERS IV SOLN: INTRAVENOUS | Status: DC

## 2022-05-15 MED ORDER — VANCOMYCIN HCL 750 MG/150ML IV SOLN
750.0000 mg | INTRAVENOUS | Status: DC
  Administered 2022-05-16: 750 mg via INTRAVENOUS
  Filled 2022-05-15: qty 150

## 2022-05-15 MED ORDER — LACTULOSE 10 GM/15ML PO SOLN
20.0000 g | Freq: Once | ORAL | Status: AC
  Administered 2022-05-15: 20 g via ORAL
  Filled 2022-05-15: qty 30

## 2022-05-15 MED ORDER — METRONIDAZOLE 500 MG/100ML IV SOLN
500.0000 mg | Freq: Two times a day (BID) | INTRAVENOUS | Status: DC
  Administered 2022-05-15 – 2022-05-17 (×4): 500 mg via INTRAVENOUS
  Filled 2022-05-15 (×4): qty 100

## 2022-05-15 MED ORDER — SODIUM CHLORIDE 0.9 % IV SOLN
2.0000 g | Freq: Two times a day (BID) | INTRAVENOUS | Status: DC
  Administered 2022-05-16 – 2022-05-17 (×3): 2 g via INTRAVENOUS
  Filled 2022-05-15 (×3): qty 12.5

## 2022-05-15 MED ORDER — SODIUM CHLORIDE 0.9 % IV SOLN
2.0000 g | Freq: Once | INTRAVENOUS | Status: AC
  Administered 2022-05-15: 2 g via INTRAVENOUS
  Filled 2022-05-15: qty 12.5

## 2022-05-15 MED ORDER — ONDANSETRON HCL 4 MG/2ML IJ SOLN: 4.0000 mg | Freq: Four times a day (QID) | INTRAMUSCULAR | Status: DC | PRN

## 2022-05-15 MED ORDER — SODIUM CHLORIDE 0.9 % IV SOLN: 2.0000 g | Freq: Once | INTRAVENOUS | Status: DC

## 2022-05-15 MED ORDER — VANCOMYCIN HCL IN DEXTROSE 1-5 GM/200ML-% IV SOLN: 1000.0000 mg | Freq: Once | INTRAVENOUS | Status: DC

## 2022-05-15 MED ORDER — LACTULOSE 10 GM/15ML PO SOLN
30.0000 g | Freq: Three times a day (TID) | ORAL | Status: DC
  Administered 2022-05-15 – 2022-05-17 (×5): 30 g via ORAL
  Filled 2022-05-15: qty 45
  Filled 2022-05-15 (×2): qty 60
  Filled 2022-05-15: qty 45
  Filled 2022-05-15: qty 60
  Filled 2022-05-15: qty 45

## 2022-05-15 NOTE — ED Notes (Signed)
Lactic acid 2.6

## 2022-05-15 NOTE — ED Notes (Signed)
Critical Lab Value  Reported to R Lockwood Lactic >9
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
## 2022-05-15 NOTE — ED Provider Notes (Signed)
[facility] DEPT Provider Note   CSN: [id number] Arrival date & time: 05/15/22  1120     History  Chief Complaint  Patient presents with   Weakness    [name] is a 66 y.o. male.  HPI Patient presents via EMS, but is soon joined by his sister provides much of the history.  Patient has a history notable for myelodysplastic syndrome, including ongoing therapy and EPO injection last week.  Sister found the patient today on the ground, after an unknown amount of time down.  Patient denies fall, states that he feels weak, but cannot specify additional details including focality of weakness, if he has fallen, or any other specifics.  He denies focal pain.  Some limitation of history secondary to the patient's withdrawn status.    Home Medications Prior to Admission medications   Medication Sig Start Date End Date Taking? Authorizing Provider  acetaminophen (TYLENOL) 325 MG tablet Take 325-650 mg by mouth every 6 (six) hours as needed for moderate pain or headache.   Yes [provider]  ALPRAZolam (XANAX) 0.25 MG tablet Take 0.125-0.25 mg by mouth daily as needed for anxiety.   Yes [provider]  epoetin alfa-epbx (RETACRIT) 36644 UNIT/ML injection Inject into the skin. Take 60 thousands every two weeks   Yes [provider]  Famotidine-Ca Carb-Mag Hydrox (PEPCID COMPLETE PO) Take 1 tablet by mouth daily as needed (indigestion).   Yes [provider]  ferrous sulfate 325 (65 FE) MG tablet Take 2 tablets by mouth daily with breakfast.   Yes [provider]  Homeopathic Products (LEG CRAMPS PO) Take 1 tablet by mouth daily as needed (pain or swelling).   Yes [provider]  mirtazapine (REMERON) 15 MG tablet Take 1 tablet (15 mg total) by mouth at bedtime. 05/01/22  Yes Orson Slick, MD  oxyCODONE (OXY IR/ROXICODONE) 5 MG immediate release tablet Take 1 tablet (5 mg total) by mouth every 4 (four)  hours as needed for moderate pain (pain score 4-6). 05/01/22  Yes Orson Slick, MD  temazepam (RESTORIL) 15 MG capsule Take 15 mg by mouth at bedtime.   Yes [provider]      Allergies    Triamterene-hctz, Chantix [varenicline], Codeine, Fluconazole, Lasix [furosemide], Sulfa antibiotics, Tramadol, and Wellbutrin [bupropion]    Review of Systems   Review of Systems  All other systems reviewed and are negative.   Physical Exam Updated Vital Signs BP (!) 121/57 (BP Location: Left Arm)   Pulse 84   Temp 98.5 F (36.9 C) (Oral)   Resp (!) 21   Ht '5\' 9"'$  (1.753 m)   Wt 65.3 kg   SpO2 98%   BMI 21.27 kg/m  Physical Exam Vitals and nursing note reviewed.  Constitutional:      General: He is not in acute distress.    Appearance: He is well-developed. He is ill-appearing.     Comments: [name] adult male answers questions briefly, without insight, without depth, but appropriately.  HENT:     Head: Normocephalic and atraumatic.  Eyes:     Conjunctiva/sclera: Conjunctivae normal.  Cardiovascular:     Rate and Rhythm: Normal rate and regular rhythm.  Pulmonary:     Effort: Tachypnea present. No respiratory distress.     Breath sounds: No stridor.  Abdominal:     General: There is no distension.  Skin:    General: Skin is warm and dry.     Coloration:  Skin is jaundiced.  Neurological:     Mental Status: He is alert.     Motor: Atrophy present. No tremor.  Psychiatric:        Behavior: Behavior is slowed and withdrawn.     ED Results / Procedures / Treatments   Labs (all labs ordered are listed, but only abnormal results are displayed) Labs Reviewed  LACTIC ACID, PLASMA - Abnormal; Notable for the following components:      Result Value   Lactic Acid, Venous 2.6 (*)    All other components within normal limits  LACTIC ACID, PLASMA - Abnormal; Notable for the following components:   Lactic Acid, Venous >9.0 (*)    All other components within  normal limits  COMPREHENSIVE METABOLIC PANEL - Abnormal; Notable for the following components:   Sodium 146 (*)    Chloride 116 (*)    BUN 30 (*)    Creatinine, Ser 1.63 (*)    Albumin 2.8 (*)    AST 57 (*)    Total Bilirubin 7.3 (*)    GFR, Estimated 46 (*)    All other components within normal limits  CBC WITH DIFFERENTIAL/PLATELET - Abnormal; Notable for the following components:   RBC 2.82 (*)    Hemoglobin 8.8 (*)    HCT 29.2 (*)    MCV 103.5 (*)    RDW 20.7 (*)    Platelets 86 (*)    Lymphs Abs 0.6 (*)    All other components within normal limits  PROTIME-INR - Abnormal; Notable for the following components:   Prothrombin Time 25.9 (*)    INR 2.4 (*)    All other components within normal limits  APTT - Abnormal; Notable for the following components:   aPTT 47 (*)    All other components within normal limits  URINALYSIS, ROUTINE W REFLEX MICROSCOPIC - Abnormal; Notable for the following components:   Color, Urine AMBER (*)    Hgb urine dipstick MODERATE (*)    Protein, ur 30 (*)    Bacteria, UA RARE (*)    All other components within normal limits  AMMONIA - Abnormal; Notable for the following components:   Ammonia 78 (*)    All other components within normal limits  SARS CORONAVIRUS 2 BY RT PCR  CULTURE, BLOOD (ROUTINE X 2)  CULTURE, BLOOD (ROUTINE X 2)  URINE CULTURE  CK  LACTIC ACID, PLASMA  LACTIC ACID, PLASMA  CBG MONITORING, ED    EKG EKG Interpretation  Date/Time:  Monday May 15 2022 11:41:42 EDT Ventricular Rate:  80 PR Interval:    QRS Duration: 108 QT Interval:  417 QTC Calculation: 482 R Axis:   -37 Text Interpretation: Sinus rhythm Left ventricular hypertrophy Borderline prolonged QT interval Poor data quality Confirmed by Carmin Muskrat [phone]) on 05/15/2022 1:11:36 PM  Radiology DG Chest Port 1 View  Result Date: 05/15/2022 CLINICAL DATA:  Possible sepsis. EXAM: PORTABLE CHEST 1 VIEW COMPARISON:  Chest radiograph 11/22/2021 and  earlier FINDINGS: Stable mild enlargement of the cardiac silhouette. Aortic calcifications. Upper lobe predominant emphysematous changes. No focal consolidation, pleural effusion, or pneumothorax. IMPRESSION: No acute cardiopulmonary abnormality. Emphysema (ICD10-J43.9). Aortic Atherosclerosis (ICD10-I70.0). Electronically Signed   By: Ileana Roup M.D.   On: 05/15/2022 12:19    Procedures Procedures    Medications Ordered in ED Medications  lactated ringers bolus 1,000 mL (has no administration in time range)  lactulose (CHRONULAC) 10 GM/15ML solution 30 g (has no administration in time range)  lactated ringers bolus  1,000 mL (1,000 mLs Intravenous New Bag/Given 05/15/22 1343)  lactulose (CHRONULAC) 10 GM/15ML solution 20 g (20 g Oral Given 05/15/22 1344)    ED Course/ Medical Decision Making/ A&P This patient with a Hx of myelodysplastic syndrome ongoing therapy presents to the ED for concern of weakness, fatigue following fall versus going to the ground, this involves an extensive number of treatment options, and is a complaint that carries with it a high risk of complications and morbidity.    The differential diagnosis includes progression of myelodysplasia, infection, dehydration, rhabdomyolysis   Social Determinants of Health:  Age, ongoing cancer  Additional history obtained:  Additional history and/or information obtained from as above sister, EMS, and I spoke with the patient's oncologist about him after his arrival, notable for details included above   After the initial evaluation, orders, including: Labs monitoring were initiated.   Patient placed on Cardiac and Pulse-Oximetry Monitors. The patient was maintained on a cardiac monitor.  The cardiac monitored showed an rhythm of 80 sinus normal The patient was also maintained on pulse oximetry. The readings were typically 99% room air normal   On repeat evaluation of the patient stayed the same  Lab Tests:  I  personally interpreted labs.  The pertinent results include: Hyperammonemia, lactic acidosis  Imaging Studies ordered:  I independently visualized and interpreted imaging which showed unremarkable chest x-ray I agree with the radiologist interpretation  Consultations Obtained:  I requested consultation with the gastroenterology, and discussed his case with his oncologist,  and discussed lab and imaging findings as well as pertinent plan - they recommend: Astro enterology will see the patient in the emergency department  Dispostion / Final MDM:  After consideration of the diagnostic results and the patient's response to treatment, patient will be admitted for further monitoring, management.  This 66 year old male with myelodysplastic syndrome, hepatic dysfunction with known history of alcohol use presents with listlessness after being found on the ground by his sister.  Patient does answer brief questions regarding his current state appropriately, though slowly.  He does move all extremities, to command.  However, the patient is grossly ill on exam, with concern for encephalopathy, and patient have progression of disease versus infection versus dehydration less likely trauma given the absence of findings on exam.  Patient's labs notable for hyperammonemia, lactic acidosis.  Though subsequent lactic acid level was elevated, some suspicion for lab error given the extreme abnormal value.  Patient received lactulose, fluid resuscitation, was admitted for further monitoring, management.  Final Clinical Impression(s) / ED Diagnoses Final diagnoses:  Acute encephalopathy     Carmin Muskrat, MD 05/15/22 [phone]

## 2022-05-15 NOTE — Progress Notes (Signed)
Pharmacy Antibiotic Note  [name] is a 66 y.o. male admitted on 05/15/2022 with sepsis.  Pharmacy has been consulted for vancomycin and cefepime dosing.  Plan: Vancomycin 1000 mg IV now, then 750 mg IV q24 hr (est AUC 427 based on SCr 1.63; Vd 0.72) Measure vancomycin AUC at steady state as indicated SCr q48 while on vanc Cefepime 2 g IV q12 hr   Height: '5\' 9"'$  (175.3 cm) Weight: 65.3 kg (144 lb) IBW/kg (Calculated) : 70.7  Temp (24hrs), Avg:98.5 F (36.9 C), Min:98.5 F (36.9 C), Max:98.5 F (36.9 C)  Recent Labs  Lab 05/15/22 1150 05/15/22 1430 05/15/22 1704  WBC 5.8  --   --   CREATININE 1.63*  --   --   LATICACIDVEN 2.6* >9.0* 1.6    Estimated Creatinine Clearance: 40.1 mL/min (A) (by C-G formula based on SCr of 1.63 mg/dL (H)).    Allergies  Allergen Reactions   Triamterene-Hctz Other (See Comments)    dizzy   Chantix [Varenicline] Nausea And Vomiting   Codeine Itching and Nausea And Vomiting    If taken on empty stomach   Fluconazole Other (See Comments)    Blisters on lips   Lasix [Furosemide] Other (See Comments)    Blisters on lips   Sulfa Antibiotics Other (See Comments)    CAUSED BLISTERS IN THE MOUTH   Tramadol Swelling   Wellbutrin [Bupropion] Nausea And Vomiting     Thank you for allowing pharmacy to be a part of this patient's care.  [name] A 05/15/2022 6:21 PM

## 2022-05-15 NOTE — ED Triage Notes (Signed)
"  Sister had not talked to him in 2 days, called for check and found him laying in floor, weak and lethargic" per EMS

## 2022-05-16 ENCOUNTER — Inpatient Hospital Stay (HOSPITAL_COMMUNITY): Payer: Medicare Other

## 2022-05-16 DIAGNOSIS — D689 Coagulation defect, unspecified: Secondary | ICD-10-CM | POA: Insufficient documentation

## 2022-05-16 DIAGNOSIS — K7030 Alcoholic cirrhosis of liver without ascites: Secondary | ICD-10-CM | POA: Insufficient documentation

## 2022-05-16 DIAGNOSIS — K7682 Hepatic encephalopathy: Secondary | ICD-10-CM | POA: Diagnosis not present

## 2022-05-16 DIAGNOSIS — R7989 Other specified abnormal findings of blood chemistry: Secondary | ICD-10-CM | POA: Diagnosis not present

## 2022-05-16 LAB — CBC
HCT: 25.9 % — ABNORMAL LOW (ref 39.0–52.0)
Hemoglobin: 7.8 g/dL — ABNORMAL LOW (ref 13.0–17.0)
MCH: 31 pg (ref 26.0–34.0)
MCHC: 30.1 g/dL (ref 30.0–36.0)
MCV: 102.8 fL — ABNORMAL HIGH (ref 80.0–100.0)
Platelets: 77 10*3/uL — ABNORMAL LOW (ref 150–400)
RBC: 2.52 MIL/uL — ABNORMAL LOW (ref 4.22–5.81)
RDW: 20.9 % — ABNORMAL HIGH (ref 11.5–15.5)
WBC: 4.4 10*3/uL (ref 4.0–10.5)
nRBC: 0 % (ref 0.0–0.2)

## 2022-05-16 LAB — URINE CULTURE: Culture: NO GROWTH

## 2022-05-16 LAB — COMPREHENSIVE METABOLIC PANEL
ALT: 17 U/L (ref 0–44)
AST: 50 U/L — ABNORMAL HIGH (ref 15–41)
Albumin: 2.4 g/dL — ABNORMAL LOW (ref 3.5–5.0)
Alkaline Phosphatase: 57 U/L (ref 38–126)
Anion gap: 6 (ref 5–15)
BUN: 27 mg/dL — ABNORMAL HIGH (ref 8–23)
CO2: 22 mmol/L (ref 22–32)
Calcium: 9.2 mg/dL (ref 8.9–10.3)
Chloride: 118 mmol/L — ABNORMAL HIGH (ref 98–111)
Creatinine, Ser: 1.41 mg/dL — ABNORMAL HIGH (ref 0.61–1.24)
GFR, Estimated: 54 mL/min — ABNORMAL LOW (ref >60)
Glucose, Bld: 88 mg/dL (ref 70–99)
Potassium: 3.5 mmol/L (ref 3.5–5.1)
Sodium: 146 mmol/L — ABNORMAL HIGH (ref 135–145)
Total Bilirubin: 6.2 mg/dL — ABNORMAL HIGH (ref 0.3–1.2)
Total Protein: 6 g/dL — ABNORMAL LOW (ref 6.5–8.1)

## 2022-05-16 LAB — HIV ANTIBODY (ROUTINE TESTING W REFLEX): HIV Screen 4th Generation wRfx: NONREACTIVE

## 2022-05-16 LAB — PROTIME-INR
INR: 2.6 — ABNORMAL HIGH (ref 0.8–1.2)
Prothrombin Time: 27.9 seconds — ABNORMAL HIGH (ref 11.4–15.2)

## 2022-05-16 LAB — AMMONIA: Ammonia: 42 umol/L — ABNORMAL HIGH (ref 9–35)

## 2022-05-16 LAB — CORTISOL-AM, BLOOD: Cortisol - AM: 11.2 ug/dL (ref 6.7–22.6)

## 2022-05-16 MED ORDER — PHYTONADIONE 5 MG PO TABS
10.0000 mg | ORAL_TABLET | Freq: Every day | ORAL | Status: DC
  Administered 2022-05-16 – 2022-05-17 (×2): 10 mg via ORAL
  Filled 2022-05-16 (×3): qty 2

## 2022-05-16 MED ORDER — ORAL CARE MOUTH RINSE: 15.0000 mL | OROMUCOSAL | Status: DC | PRN

## 2022-05-16 MED ORDER — VANCOMYCIN HCL IN DEXTROSE 1-5 GM/200ML-% IV SOLN
1000.0000 mg | INTRAVENOUS | Status: DC
  Administered 2022-05-17: 1000 mg via INTRAVENOUS
  Filled 2022-05-16: qty 200

## 2022-05-16 NOTE — ED Notes (Signed)
Pt placed on bedpan

## 2022-05-16 NOTE — ED Notes (Signed)
Pt to doorway with wires still connected, pt states he has to poop. Pt having bowel movement at same time. Pt cleaned and assisted back into bed. Bed alarm and rails in place, pt reminded to stay in bed and use call bell.

## 2022-05-16 NOTE — ED Notes (Signed)
Admitting at the bedside.  

## 2022-05-16 NOTE — Progress Notes (Addendum)
Progress Note   Subjective  Chief Complaint: Decompensated alcoholic cirrhosis with hepatic encephalopathy  Today, patient does seem more clearheaded.  He is able to tell me his name, where he is at and what time it is.  He is not quite sure why they are keeping him in the hospital and denies any overt pain or complaints.   Objective   Vital signs in last 24 hours: Temp:  [97.8 F (36.6 C)-98.5 F (36.9 C)] 97.8 F (36.6 C) (10/03 0453) Pulse Rate:  [72-99] 83 (10/03 0800) Resp:  [16-25] 21 (10/03 0800) BP: (110-145)/(57-72) 140/68 (10/03 0800) SpO2:  [96 %-100 %] 100 % (10/03 0800) Weight:  [65.3 kg] 65.3 kg (10/02 1310)   General:    Ill-appearing jaundiced male in NAD Heart:  Regular rate and rhythm; no murmurs Lungs: Respirations even and unlabored, lungs CTA bilaterally Abdomen:  Soft, nontender and nondistended. Normal bowel sounds. Psych:  Cooperative. Normal mood and affect.  Intake/Output from previous day: 10/02 0701 - 10/03 0700 In: 1089.8 [IV Piggyback:1089.8] Out: -    Lab Results: Recent Labs    05/15/22 1150 05/16/22 0456  WBC 5.8 4.4  HGB 8.8* 7.8*  HCT 29.2* 25.9*  PLT 86* 77*   BMET Recent Labs    05/15/22 1150 05/16/22 0456  NA 146* 146*  K 3.8 3.5  CL 116* 118*  CO2 22 22  GLUCOSE 92 88  BUN 30* 27*  CREATININE 1.63* 1.41*  CALCIUM 9.7 9.2      Latest Ref Rng & Units 05/16/2022    4:56 AM 05/15/2022   11:50 AM 05/01/2022    1:28 PM  Hepatic Function  Total Protein 6.5 - 8.1 g/dL 6.0  6.9  6.8   Albumin 3.5 - 5.0 g/dL 2.4  2.8  3.0   AST 15 - 41 U/L 50  57  36   ALT 0 - 44 U/L '17  20  12   ' Alk Phosphatase 38 - 126 U/L 57  69  92   Total Bilirubin 0.3 - 1.2 mg/dL 6.2  7.3  4.4      PT/INR Recent Labs    05/15/22 1150 05/16/22 0456  LABPROT 25.9* 27.9*  INR 2.4* 2.6*    Studies/Results: US Abdomen Limited RUQ (LIVER/GB)  Result Date: 05/15/2022 CLINICAL DATA:  Elevated LFTs EXAM: ULTRASOUND ABDOMEN LIMITED RIGHT  UPPER QUADRANT COMPARISON:  CT abdomen/pelvis dated 10/16/2017 FINDINGS: Gallbladder: 2 mm gallbladder polyp, benign. No follow-up is required due to small size. No gallstones or gallbladder wall thickening. Negative sonographic Murphy's sign. Common bile duct: Diameter: 4 mm Liver: Hyperechoic hepatic parenchyma, raising the possibly of hepatic steatosis. 11 mm simple cyst in the right hepatic lobe. Portal vein is patent on color Doppler imaging with normal direction of blood flow towards the liver. Other: Incidentally noted is a 2.8 cm right upper pole renal cyst. IMPRESSION: Suspected hepatic steatosis. Additional ancillary findings as above. Electronically Signed   By: Julian Hy M.D.   On: 05/15/2022 21:29   DG Chest Port 1 View  Result Date: 05/15/2022 CLINICAL DATA:  Possible sepsis. EXAM: PORTABLE CHEST 1 VIEW COMPARISON:  Chest radiograph 11/22/2021 and earlier FINDINGS: Stable mild enlargement of the cardiac silhouette. Aortic calcifications. Upper lobe predominant emphysematous changes. No focal consolidation, pleural effusion, or pneumothorax. IMPRESSION: No acute cardiopulmonary abnormality. Emphysema (ICD10-J43.9). Aortic Atherosclerosis (ICD10-I70.0). Electronically Signed   By: Ileana Roup M.D.   On: 05/15/2022 12:19     Assessment / Plan:  Assessment: 1.  Decompensated alcoholic cirrhosis with portal hypertension, encephalopathy: Elevated LFTs, T. bili improved today 7.3--> 6.2, INR slightly worsened 2.4--> 2.6, abdominal ultrasound yesterday with no sign of ascites, patient some better overnight after addition of Lactulose and broad-spectrum IV antibiotics 2.  Myelodysplastic syndrome  Plan: 1.  Continue current medications and plan 2.  Okay with low-sodium diet  Thank you for your kind consultation, we will continue to follow along   LOS: 1 day   [name]  05/16/2022, 9:03 AM   Attending Physician Note   I have taken an interval history, reviewed the  chart and examined the patient. I performed more than 50% of this encounter in conjunction with the APP. I agree with the APP's note, impression and recommendations with my edits. My additional impressions and recommendations are as follows.   Decompensated alcoholic cirrhosis with portal hypertension, HE. Improved today. RUQ US shows no ascites, hyperechoic hepatic parenchyma and portal vein is patient with normal direction of blood flow. Continue lactulose, IV antibiotics and add Vit K. Continue to trend CBC, CMP, ammonia, PT/INR.    Brandon Edward, MD [facility] See AMION, [facility], for our on call provider

## 2022-05-16 NOTE — Progress Notes (Signed)
Pharmacy Antibiotic Note  [name] is a 66 y.o. male with decompensated EtOH cirrhosis who presented to the ED on 05/15/2022 with c/o generalized weakness.  He's currently on vancomycin, cefepime and flagyl for broad empiric coverage.    Today, 05/16/2022: - Day #1 abx - afeb, wbc wnl - scr down 1.41 (crcl~46)  Plan: - adjust vancomycin dose to 1000 mg q24h for est AUC 500 - continue cefepime 2gm q12h and flagyl 500 mg q12h  ______________________________________  Height: '5\' 9"'$  (175.3 cm) Weight: 65.3 kg (144 lb) IBW/kg (Calculated) : 70.7  Temp (24hrs), Avg:98 F (36.7 C), Min:97.8 F (36.6 C), Max:98.5 F (36.9 C)  Recent Labs  Lab 05/15/22 1150 05/15/22 1430 05/15/22 1704 05/15/22 1720 05/15/22 2138 05/16/22 0456  WBC 5.8  --   --   --   --  4.4  CREATININE 1.63*  --   --   --   --  1.41*  LATICACIDVEN 2.6* >9.0* 1.6 1.4 1.7  --     Estimated Creatinine Clearance: 46.3 mL/min (A) (by C-G formula based on SCr of 1.41 mg/dL (H)).    Allergies  Allergen Reactions   Triamterene-Hctz Other (See Comments)    dizzy   Chantix [Varenicline] Nausea And Vomiting   Codeine Itching and Nausea And Vomiting    If taken on empty stomach   Fluconazole Other (See Comments)    Blisters on lips   Lasix [Furosemide] Other (See Comments)    Blisters on lips   Sulfa Antibiotics Other (See Comments)    CAUSED BLISTERS IN THE MOUTH   Tramadol Swelling   Wellbutrin [Bupropion] Nausea And Vomiting     Thank you for allowing pharmacy to be a part of this patient's care.  Lynelle Doctor 05/16/2022 10:50 AM

## 2022-05-16 NOTE — Progress Notes (Signed)
PROGRESS NOTE    [name]  HTD:[id number] DOB: 04/26/1956 DOA: 05/15/2022 PCP: Josetta Huddle, MD    Brief Narrative:  66 year old with history of myelodysplastic syndrome, alcoholic liver cirrhosis, depression anxiety found at home by sister with confusion, falling on the floor when she saw him after 4 days.  Brought to the ER.  In the emergency room, jaundiced, confused, bilirubin more than 7.  Blood pressure stable.  Treated as decompensated cirrhosis.  Ammonia was 78.   Assessment & Plan:  Alcoholic cirrhosis with encephalopathy: MELD score 28.  Confusion and colopathy.  LFTs worsening. Symptomatic treatment, aggressive lactulose therapy. Continue to monitor.  Cultures pending. On broad-spectrum antibiotics, blood cultures negative so far. Abdominal ultrasound with no ascites, no indication for paracentesis.  Myelodysplastic syndrome, pancytopenia related to chronic liver disease and myelodysplastic syndrome Adult failure to thrive  Symptomatic treatment. Palliative to consult, goal of care and also may benefit with hospice level of care.   DVT prophylaxis: SCDs Start: 05/15/22 2030   Code Status: DNR Family Communication: Sister at the bedside Disposition Plan: Status is: Inpatient Remains inpatient appropriate because: Encephalopathy     Consultants:  Gastroenterology Palliative  Procedures:  None  Antimicrobials:  Vancomycin cefepime and Flagyl 10/2---   Subjective: Patient seen in the morning rounds.  Stated hungry.  Denied any complaints.  He thinks he is at [facility].  He thinks he lives with his wife and was asking me to call his wife.  Multiple bowel movements with lactulose.  Objective: Vitals:   05/16/22 0904 05/16/22 1000 05/16/22 1332 05/16/22 1340  BP:  (!) 144/61  131/62  Pulse:  87  88  Resp:  20  (!) 25  Temp: 97.9 F (36.6 C)  98.7 F (37.1 C)   TempSrc: Oral  Oral   SpO2:  99%  100%  Weight:      Height:         Intake/Output Summary (Last 24 hours) at 05/16/2022 1428 Last data filed at 05/16/2022 1214 Gross per 24 hour  Intake 1089.77 ml  Output 250 ml  Net 839.77 ml   Filed Weights   05/15/22 1310  Weight: 65.3 kg    Examination:  General exam: Appears anxious.  Tremulous.  Deeply icteric chronically sick looking. Respiratory system: No added sounds. Cardiovascular system: S1 & S2 heard, RRR.  Tachycardia . Gastrointestinal system: Abdomen is soft.  Nontender.  No palpable organomegaly.   Central nervous system: Alert and awake.  Oriented to himself, not oriented to place or time. No evidence of tremors or asterixis. Extremities: Moves all extremities.    Data Reviewed: I have personally reviewed following labs and imaging studies  CBC: Recent Labs  Lab 05/15/22 1150 05/16/22 0456  WBC 5.8 4.4  NEUTROABS 4.6  --   HGB 8.8* 7.8*  HCT 29.2* 25.9*  MCV 103.5* 102.8*  PLT 86* 77*   Basic Metabolic Panel: Recent Labs  Lab 05/15/22 1150 05/16/22 0456  NA 146* 146*  K 3.8 3.5  CL 116* 118*  CO2 22 22  GLUCOSE 92 88  BUN 30* 27*  CREATININE 1.63* 1.41*  CALCIUM 9.7 9.2   GFR: Estimated Creatinine Clearance: 46.3 mL/min (A) (by C-G formula based on SCr of 1.41 mg/dL (H)). Liver Function Tests: Recent Labs  Lab 05/15/22 1150 05/16/22 0456  AST 57* 50*  ALT 20 17  ALKPHOS 69 57  BILITOT 7.3* 6.2*  PROT 6.9 6.0*  ALBUMIN 2.8* 2.4*   No results for input(s): "  LIPASE", "AMYLASE" in the last 168 hours. Recent Labs  Lab 05/15/22 1159 05/16/22 0456  AMMONIA 78* 42*   Coagulation Profile: Recent Labs  Lab 05/15/22 1150 05/16/22 0456  INR 2.4* 2.6*   Cardiac Enzymes: Recent Labs  Lab 05/15/22 1704  CKTOTAL 211   BNP (last 3 results) No results for input(s): "PROBNP" in the last 8760 hours. HbA1C: No results for input(s): "HGBA1C" in the last 72 hours. CBG: Recent Labs  Lab 05/15/22 1136  GLUCAP 85   Lipid Profile: No results for input(s):  "CHOL", "HDL", "LDLCALC", "TRIG", "CHOLHDL", "LDLDIRECT" in the last 72 hours. Thyroid Function Tests: No results for input(s): "TSH", "T4TOTAL", "FREET4", "T3FREE", "THYROIDAB" in the last 72 hours. Anemia Panel: No results for input(s): "VITAMINB12", "FOLATE", "FERRITIN", "TIBC", "IRON", "RETICCTPCT" in the last 72 hours. Sepsis Labs: Recent Labs  Lab 05/15/22 1430 05/15/22 1704 05/15/22 1720 05/15/22 2138  PROCALCITON  --   --   --  0.18  LATICACIDVEN >9.0* 1.6 1.4 1.7    Recent Results (from the past 240 hour(s))  Blood Culture (routine x 2)     Status: None (Preliminary result)   Collection Time: 05/15/22 11:50 AM   Specimen: BLOOD  Result Value Ref Range Status   Specimen Description   Final    BLOOD BLOOD RIGHT ARM Performed at [facility] [address], [place], [place] [address]    Special Requests   Final    BOTTLES DRAWN AEROBIC AND ANAEROBIC Blood Culture results may not be optimal due to an inadequate volume of blood received in culture bottles Performed at [facility] [address] [place], [place] [address]    Culture   Final    NO GROWTH < 24 HOURS Performed at [facility] [address] [place], [place] [address]    Report Status PENDING  Incomplete  Blood Culture (routine x 2)     Status: None (Preliminary result)   Collection Time: 05/15/22 11:50 AM   Specimen: BLOOD  Result Value Ref Range Status   Specimen Description   Final    BLOOD RIGHT ANTECUBITAL Performed at [facility] [address] [place], [place] [address]    Special Requests   Final    BOTTLES DRAWN AEROBIC AND ANAEROBIC Blood Culture adequate volume Performed at [facility] [address] [place], [place] [address]    Culture   Final    NO GROWTH < 24 HOURS Performed at [facility] [address] [place], [place] [address]    Report Status PENDING  Incomplete  Urine Culture      Status: None   Collection Time: 05/15/22 11:50 AM   Specimen: In/Out Cath Urine  Result Value Ref Range Status   Specimen Description   Final    IN/OUT CATH URINE Performed at [facility] [address] [place], [place] [address]    Special Requests   Final    NONE Performed at [facility], [place] [address], [place], [place] [address]    Culture   Final    NO GROWTH Performed at [facility], [place] [address] [place], [place] [address]    Report Status 05/16/2022 FINAL  Final  SARS Coronavirus 2 by RT PCR (hospital order, performed in [facility] hospital lab) *cepheid single result test*     Status: None   Collection Time: 05/15/22 11:59 AM   Specimen: Nasal Swab  Result Value Ref Range Status   SARS Coronavirus 2 by RT PCR NEGATIVE NEGATIVE Final    Comment: (NOTE) SARS-CoV-2 target nucleic acids are NOT DETECTED.  The SARS-CoV-2 RNA is generally detectable in upper and lower respiratory specimens during the acute phase of infection. The lowest concentration of SARS-CoV-2 viral copies this assay can detect is 250 copies / mL. A negative result does not preclude SARS-CoV-2 infection and should not be used as the sole basis for treatment or other patient management decisions.  A negative result may occur with improper specimen collection / handling, submission of specimen other than nasopharyngeal swab, presence of viral mutation(s) within the areas targeted by this assay, and inadequate number of viral copies (<250 copies / mL). A negative result must be combined with clinical observations, patient history, and epidemiological information.  Fact Sheet for Patients:   https://www.patel.info/  Fact Sheet for Healthcare Providers: https://hall.com/  This test is not yet approved or  cleared by the [place] FDA and has been authorized for detection and/or diagnosis of SARS-CoV-2  by FDA under an Emergency Use Authorization (EUA).  This EUA will remain in effect (meaning this test can be used) for the duration of the COVID-19 declaration under Section 564(b)(1) of the Act, 21 U.S.C. section 360bbb-3(b)(1), unless the authorization is terminated or revoked sooner.  Performed at [facility], [place] [address], [place], [place] [address]          Radiology Studies: US Abdomen Limited RUQ (LIVER/GB)  Result Date: 05/15/2022 CLINICAL DATA:  Elevated LFTs EXAM: ULTRASOUND ABDOMEN LIMITED RIGHT UPPER QUADRANT COMPARISON:  CT abdomen/pelvis dated 10/16/2017 FINDINGS: Gallbladder: 2 mm gallbladder polyp, benign. No follow-up is required due to small size. No gallstones or gallbladder wall thickening. Negative sonographic Murphy's sign. Common bile duct: Diameter: 4 mm Liver: Hyperechoic hepatic parenchyma, raising the possibly of hepatic steatosis. 11 mm simple cyst in the right hepatic lobe. Portal vein is patent on color Doppler imaging with normal direction of blood flow towards the liver. Other: Incidentally noted is a 2.8 cm right upper pole renal cyst. IMPRESSION: Suspected hepatic steatosis. Additional ancillary findings as above. Electronically Signed   By: Julian Hy M.D.   On: 05/15/2022 21:29   DG Chest Port 1 View  Result Date: 05/15/2022 CLINICAL DATA:  Possible sepsis. EXAM: PORTABLE CHEST 1 VIEW COMPARISON:  Chest radiograph 11/22/2021 and earlier FINDINGS: Stable mild enlargement of the cardiac silhouette. Aortic calcifications. Upper lobe predominant emphysematous changes. No focal consolidation, pleural effusion, or pneumothorax. IMPRESSION: No acute cardiopulmonary abnormality. Emphysema (ICD10-J43.9). Aortic Atherosclerosis (ICD10-I70.0). Electronically Signed   By: Ileana Roup M.D.   On: 05/15/2022 12:19        Scheduled Meds:  lactulose  30 g Oral TID   Continuous Infusions:  ceFEPime (MAXIPIME) IV Stopped (05/16/22 3559)    lactated ringers 125 mL/hr at 05/16/22 0232   metronidazole 500 mg (05/16/22 1000)   [START ON 05/17/2022] vancomycin       LOS: 1 day    Time spent: 35 minutes    Barb Merino, MD [facility] Pager [phone]

## 2022-05-17 DIAGNOSIS — E872 Acidosis, unspecified: Secondary | ICD-10-CM | POA: Diagnosis not present

## 2022-05-17 DIAGNOSIS — K7682 Hepatic encephalopathy: Secondary | ICD-10-CM | POA: Diagnosis not present

## 2022-05-17 DIAGNOSIS — Z7189 Other specified counseling: Secondary | ICD-10-CM

## 2022-05-17 DIAGNOSIS — L8990 Pressure ulcer of unspecified site, unspecified stage: Secondary | ICD-10-CM | POA: Insufficient documentation

## 2022-05-17 LAB — CBC WITH DIFFERENTIAL/PLATELET
Abs Immature Granulocytes: 0.02 10*3/uL (ref 0.00–0.07)
Basophils Absolute: 0 10*3/uL (ref 0.0–0.1)
Basophils Relative: 0 %
Eosinophils Absolute: 0.1 10*3/uL (ref 0.0–0.5)
Eosinophils Relative: 3 %
HCT: 26.5 % — ABNORMAL LOW (ref 39.0–52.0)
Hemoglobin: 7.9 g/dL — ABNORMAL LOW (ref 13.0–17.0)
Immature Granulocytes: 1 %
Lymphocytes Relative: 14 %
Lymphs Abs: 0.5 10*3/uL — ABNORMAL LOW (ref 0.7–4.0)
MCH: 31.2 pg (ref 26.0–34.0)
MCHC: 29.8 g/dL — ABNORMAL LOW (ref 30.0–36.0)
MCV: 104.7 fL — ABNORMAL HIGH (ref 80.0–100.0)
Monocytes Absolute: 0.4 10*3/uL (ref 0.1–1.0)
Monocytes Relative: 10 %
Neutro Abs: 2.7 10*3/uL (ref 1.7–7.7)
Neutrophils Relative %: 72 %
Platelets: 83 10*3/uL — ABNORMAL LOW (ref 150–400)
RBC: 2.53 MIL/uL — ABNORMAL LOW (ref 4.22–5.81)
RDW: 20.5 % — ABNORMAL HIGH (ref 11.5–15.5)
WBC: 3.7 10*3/uL — ABNORMAL LOW (ref 4.0–10.5)
nRBC: 0 % (ref 0.0–0.2)

## 2022-05-17 LAB — PROTIME-INR
INR: 2.4 — ABNORMAL HIGH (ref 0.8–1.2)
Prothrombin Time: 25.7 seconds — ABNORMAL HIGH (ref 11.4–15.2)

## 2022-05-17 LAB — COMPREHENSIVE METABOLIC PANEL
ALT: 18 U/L (ref 0–44)
AST: 52 U/L — ABNORMAL HIGH (ref 15–41)
Albumin: 2.3 g/dL — ABNORMAL LOW (ref 3.5–5.0)
Alkaline Phosphatase: 55 U/L (ref 38–126)
Anion gap: 7 (ref 5–15)
BUN: 22 mg/dL (ref 8–23)
CO2: 22 mmol/L (ref 22–32)
Calcium: 9 mg/dL (ref 8.9–10.3)
Chloride: 114 mmol/L — ABNORMAL HIGH (ref 98–111)
Creatinine, Ser: 1.33 mg/dL — ABNORMAL HIGH (ref 0.61–1.24)
GFR, Estimated: 58 mL/min — ABNORMAL LOW (ref >60)
Glucose, Bld: 116 mg/dL — ABNORMAL HIGH (ref 70–99)
Potassium: 3.1 mmol/L — ABNORMAL LOW (ref 3.5–5.1)
Sodium: 143 mmol/L (ref 135–145)
Total Bilirubin: 5.5 mg/dL — ABNORMAL HIGH (ref 0.3–1.2)
Total Protein: 6 g/dL — ABNORMAL LOW (ref 6.5–8.1)

## 2022-05-17 LAB — AMMONIA: Ammonia: 46 umol/L — ABNORMAL HIGH (ref 9–35)

## 2022-05-17 MED ORDER — GLYCOPYRROLATE 0.2 MG/ML IJ SOLN: 0.2000 mg | INTRAMUSCULAR | Status: DC | PRN

## 2022-05-17 MED ORDER — MORPHINE SULFATE (CONCENTRATE) 10 MG/0.5ML PO SOLN
5.0000 mg | ORAL | Status: DC | PRN
  Administered 2022-05-18: 5 mg via ORAL
  Filled 2022-05-17: qty 0.5

## 2022-05-17 MED ORDER — LORAZEPAM 2 MG/ML IJ SOLN
1.0000 mg | INTRAMUSCULAR | Status: DC | PRN
  Administered 2022-05-18: 1 mg via INTRAVENOUS
  Filled 2022-05-17: qty 1

## 2022-05-17 MED ORDER — RIFAXIMIN 550 MG PO TABS
550.0000 mg | ORAL_TABLET | Freq: Two times a day (BID) | ORAL | Status: DC
  Filled 2022-05-17: qty 1

## 2022-05-17 MED ORDER — LORAZEPAM 1 MG PO TABS
1.0000 mg | ORAL_TABLET | ORAL | Status: DC | PRN
  Administered 2022-05-18: 1 mg via ORAL
  Filled 2022-05-17: qty 1

## 2022-05-17 MED ORDER — ACETAMINOPHEN 325 MG PO TABS: 650.0000 mg | ORAL_TABLET | Freq: Four times a day (QID) | ORAL | Status: DC | PRN

## 2022-05-17 MED ORDER — HALOPERIDOL 0.5 MG PO TABS: 0.5000 mg | ORAL_TABLET | ORAL | Status: DC | PRN

## 2022-05-17 MED ORDER — LORAZEPAM 2 MG/ML PO CONC: 1.0000 mg | ORAL | Status: DC | PRN

## 2022-05-17 MED ORDER — GLYCOPYRROLATE 1 MG PO TABS: 1.0000 mg | ORAL_TABLET | ORAL | Status: DC | PRN

## 2022-05-17 MED ORDER — POLYVINYL ALCOHOL 1.4 % OP SOLN: 1.0000 [drp] | Freq: Four times a day (QID) | OPHTHALMIC | Status: DC | PRN

## 2022-05-17 MED ORDER — HALOPERIDOL LACTATE 5 MG/ML IJ SOLN: 0.5000 mg | INTRAMUSCULAR | Status: DC | PRN

## 2022-05-17 MED ORDER — POTASSIUM CHLORIDE CRYS ER 20 MEQ PO TBCR
40.0000 meq | EXTENDED_RELEASE_TABLET | Freq: Two times a day (BID) | ORAL | Status: DC
  Filled 2022-05-17: qty 2

## 2022-05-17 MED ORDER — ACETAMINOPHEN 650 MG RE SUPP: 650.0000 mg | Freq: Four times a day (QID) | RECTAL | Status: DC | PRN

## 2022-05-17 MED ORDER — HALOPERIDOL LACTATE 2 MG/ML PO CONC: 0.5000 mg | ORAL | Status: DC | PRN

## 2022-05-17 NOTE — Progress Notes (Signed)
Progress Note   Subjective  Day 2  Chief Complaint: Decompensated alcoholic cirrhosis with hepatic encephalopathy  Today, the patient is even more clearheaded and tells me that he feels better than yesterday.  He is very thankful for all the care he has received here in the hospital and even becomes tearful at one point.  Denies any new complaints or concerns.   Objective   Vital signs in last 24 hours: Temp:  [98.1 F (36.7 C)-98.8 F (37.1 C)] 98.4 F (36.9 C) (10/04 0628) Pulse Rate:  [76-88] 80 (10/04 0628) Resp:  [14-25] 20 (10/04 0628) BP: (116-144)/(57-73) 135/57 (10/04 0628) SpO2:  [96 %-100 %] 96 % (10/04 0628) Weight:  [62.2 kg] 62.2 kg (10/03 1801) Last BM Date : 05/16/22 General:    white male in NAD Heart:  Regular rate and rhythm; no murmurs Lungs: Respirations even and unlabored, lungs CTA bilaterally Abdomen:  Soft, nontender and nondistended. Normal bowel sounds. Psych:  Cooperative. Normal mood and affect.  Intake/Output from previous day: 10/03 0701 - 10/04 0700 In: 5459.8 [P.O.:600; I.V.:4264.2; IV Piggyback:595.5] Out: 700 [Urine:700]   Lab Results: Recent Labs    05/15/22 1150 05/16/22 0456  WBC 5.8 4.4  HGB 8.8* 7.8*  HCT 29.2* 25.9*  PLT 86* 77*   BMET Recent Labs    05/15/22 1150 05/16/22 0456  NA 146* 146*  K 3.8 3.5  CL 116* 118*  CO2 22 22  GLUCOSE 92 88  BUN 30* 27*  CREATININE 1.63* 1.41*  CALCIUM 9.7 9.2      Latest Ref Rng & Units 05/16/2022    4:56 AM 05/15/2022   11:50 AM 05/01/2022    1:28 PM  Hepatic Function  Total Protein 6.5 - 8.1 g/dL 6.0  6.9  6.8   Albumin 3.5 - 5.0 g/dL 2.4  2.8  3.0   AST 15 - 41 U/L 50  57  36   ALT 0 - 44 U/L '17  20  12   ' Alk Phosphatase 38 - 126 U/L 57  69  92   Total Bilirubin 0.3 - 1.2 mg/dL 6.2  7.3  4.4      PT/INR Recent Labs    05/15/22 1150 05/16/22 0456  LABPROT 25.9* 27.9*  INR 2.4* 2.6*    Studies/Results: US Abdomen Limited RUQ (LIVER/GB)  Result Date:  05/15/2022 CLINICAL DATA:  Elevated LFTs EXAM: ULTRASOUND ABDOMEN LIMITED RIGHT UPPER QUADRANT COMPARISON:  CT abdomen/pelvis dated 10/16/2017 FINDINGS: Gallbladder: 2 mm gallbladder polyp, benign. No follow-up is required due to small size. No gallstones or gallbladder wall thickening. Negative sonographic Murphy's sign. Common bile duct: Diameter: 4 mm Liver: Hyperechoic hepatic parenchyma, raising the possibly of hepatic steatosis. 11 mm simple cyst in the right hepatic lobe. Portal vein is patent on color Doppler imaging with normal direction of blood flow towards the liver. Other: Incidentally noted is a 2.8 cm right upper pole renal cyst. IMPRESSION: Suspected hepatic steatosis. Additional ancillary findings as above. Electronically Signed   By: Julian Hy M.D.   On: 05/15/2022 21:29   DG Chest Port 1 View  Result Date: 05/15/2022 CLINICAL DATA:  Possible sepsis. EXAM: PORTABLE CHEST 1 VIEW COMPARISON:  Chest radiograph 11/22/2021 and earlier FINDINGS: Stable mild enlargement of the cardiac silhouette. Aortic calcifications. Upper lobe predominant emphysematous changes. No focal consolidation, pleural effusion, or pneumothorax. IMPRESSION: No acute cardiopulmonary abnormality. Emphysema (ICD10-J43.9). Aortic Atherosclerosis (ICD10-I70.0). Electronically Signed   By: Ileana Roup M.D.   On: 05/15/2022 12:19  Assessment / Plan:   Assessment: 1.  Decompensated alcoholic cirrhosis with portal hypertension encephalopathy: Elevated LFTs, T. bili improved yesterday 7.3--> 6.2, labs pending from this morning, INR worsened slightly yesterday 2.4--> 2.6, labs pending from today, ultrasound with no ascites, patient getting better over the past 48 hours with addition of Lactulose and Vancomycin 2.  Myelodysplastic syndrome  Plan: 1.  Continue Lactulose and IV vancomycin 2.  Continue to trend CBC, CMP, ammonia and PT/INR  Thank you for kind consultation, we will continue to follow along.    LOS: 2 days   Levin Erp  05/17/2022, 9:55 AM

## 2022-05-17 NOTE — Plan of Care (Signed)
  Problem: Safety: Goal: Ability to remain free from injury will improve Outcome: Progressing   Problem: Clinical Measurements: Goal: Diagnostic test results will improve Outcome: Progressing

## 2022-05-17 NOTE — Evaluation (Signed)
Physical Therapy Evaluation Patient Details Name: [name] MRN: [id number] DOB: 03-31-1956 Today's Date: 05/17/2022  History of Present Illness  66 year old with history of myelodysplastic syndrome, alcoholic liver cirrhosis, depression anxiety found at home by sister with confusion, falling on the floor when she saw him after 4 days.  Brought to the ER.  In the emergency room, jaundiced, confused, bilirubin more than 7.  Blood pressure stable.  TPt admitted for Alcoholic cirrhosis with encephalopathy.    Clinical Impression  [name] is 66 y.o. male admitted with above HPI and diagnosis. Patient is currently limited by functional impairments below (see PT problem list). Patient lives alone and is independent at baseline. Currently pt requires min assist for functional mobility with min assist to steady and prevent LOB 2x during gait. Pt cognitively impaired requiring re-orienting to situation and cues for safe self care (pt attempted to complete pericare without toilet paper and required cues/assist to correct). Patient will benefit from continued skilled PT interventions to address impairments and progress independence with mobility, recommending ST rehab at [facility]. Acute PT will follow and progress as able.        Recommendations for follow up therapy are one component of a multi-disciplinary discharge planning process, led by the attending physician.  Recommendations may be updated based on patient status, additional functional criteria and insurance authorization.  Follow Up Recommendations Skilled nursing-short term rehab (<3 hours/day) Can patient physically be transported by private vehicle: Yes    Assistance Recommended at Discharge Frequent or constant Supervision/Assistance  Patient can return home with the following  A little help with walking and/or transfers;A little help with bathing/dressing/bathroom;Assistance with cooking/housework;Direct supervision/assist for  medications management;Assist for transportation;Help with stairs or ramp for entrance    Equipment Recommendations None recommended by PT  Recommendations for Other Services       Functional Status Assessment Patient has had a recent decline in their functional status and demonstrates the ability to make significant improvements in function in a reasonable and predictable amount of time.     Precautions / Restrictions Precautions Precautions: Fall Restrictions Weight Bearing Restrictions: No      Mobility  Bed Mobility Overal bed mobility: Needs Assistance Bed Mobility: Supine to Sit     Supine to sit: HOB elevated, Min guard, Min assist     General bed mobility comments: Cues for sequencing and to initiate reaching for bed rail and bring feet off EOB fully. cues to press up through hands/elbow and guarding for safety as pt able to raise trunk.    Transfers Overall transfer level: Needs assistance Equipment used: Rolling walker (2 wheels) Transfers: Sit to/from Stand Sit to Stand: Min assist           General transfer comment: Cues for hand placement with RW, assist from silghtly elevated EOB to power up and steady in standing. pt completed from EOB, recliner, and toilet (elevated BSC).    Ambulation/Gait Ambulation/Gait assistance: Min assist, +2 safety/equipment Gait Distance (Feet): 50 Feet Assistive device: Rolling walker (2 wheels) Gait Pattern/deviations: Step-through pattern, Decreased stride length, Knee flexed in stance - right, Decreased weight shift to right, Trunk flexed, Narrow base of support Gait velocity: decr     General Gait Details: cues for proximity to RW and assist to maintain centered position to walker. Pt required min assist for posture and balance, slight LOB during turns in hallway and bathroom.  Stairs            Emergency planning/management officer  Modified Rankin (Stroke Patients Only)       Balance Overall balance assessment: Needs  assistance Sitting-balance support: Feet supported Sitting balance-Leahy Scale: Good     Standing balance support: During functional activity, Reliant on assistive device for balance, Bilateral upper extremity supported Standing balance-Leahy Scale: Poor Standing balance comment: hx of falls, reliant on RW                             Pertinent Vitals/Pain Pain Assessment Pain Assessment: No/denies pain    Home Living Family/patient expects to be discharged to:: Private residence Living Arrangements: Alone Available Help at Discharge: Family (sister lives in [place] he is in [place]) Type of Home: House Home Access: Stairs to enter Entrance Stairs-Rails: Psychiatric nurse of Steps: 4   Home Layout: One level Home Equipment: Conservation officer, nature (2 wheels);BSC/3in1;Cane - single point;Shower seat      Prior Function Prior Level of Function : Independent/Modified Independent             Mobility Comments: Since hip revision in April pt has been using RW for gait ADLs Comments: Ind with ADLs     Hand Dominance   Dominant Hand: Right    Extremity/Trunk Assessment   Upper Extremity Assessment Upper Extremity Assessment: Generalized weakness;Overall WFL for tasks assessed    Lower Extremity Assessment Lower Extremity Assessment: Generalized weakness;RLE deficits/detail;LLE deficits/detail RLE Deficits / Details: 3-/5 for hip flexion, 4-/5 for ankle dorsi/plantarflexion RLE Sensation: WNL RLE Coordination: WNL LLE Deficits / Details: 3-/5 for hip flexion, 4-/5 for ankle dorsi/plantarflexion LLE Sensation: WNL LLE Coordination: WNL    Cervical / Trunk Assessment Cervical / Trunk Assessment: Normal  Communication   Communication: No difficulties  Cognition Arousal/Alertness: Awake/alert Behavior During Therapy: WFL for tasks assessed/performed Overall Cognitive Status: Impaired/Different from baseline Area of Impairment:  Orientation, Memory, Following commands, Safety/judgement, Awareness, Problem solving                 Orientation Level: Disoriented to, Situation, Time   Memory: Decreased short-term memory, Decreased recall of precautions Following Commands: Follows one step commands consistently, Follows one step commands with increased time, Follows multi-step commands inconsistently, Follows multi-step commands with increased time Safety/Judgement: Decreased awareness of safety Awareness: Emergent Problem Solving: Slow processing, Decreased initiation, Difficulty sequencing, Requires verbal cues          General Comments      Exercises     Assessment/Plan    PT Assessment Patient needs continued PT services  PT Problem List         PT Treatment Interventions DME instruction;Gait training;Stair training;Functional mobility training;Therapeutic activities;Therapeutic exercise    PT Goals (Current goals can be found in the Care Plan section)  Acute Rehab PT Goals Patient Stated Goal: get better and regain independence PT Goal Formulation: With patient/family Time For Goal Achievement: 05/31/22 Potential to Achieve Goals: Fair    Frequency Min 3X/week     Co-evaluation               AM-PAC PT "6 Clicks" Mobility  Outcome Measure Help needed turning from your back to your side while in a flat bed without using bedrails?: A Little Help needed moving from lying on your back to sitting on the side of a flat bed without using bedrails?: A Little Help needed moving to and from a bed to a chair (including a wheelchair)?: A Little Help needed standing up from a chair using  your arms (e.g., wheelchair or bedside chair)?: A Little Help needed to walk in hospital room?: A Little Help needed climbing 3-5 steps with a railing? : A Lot 6 Click Score: 17    End of Session Equipment Utilized During Treatment: Gait belt Activity Tolerance: Patient tolerated treatment well Patient  left: in chair;with call bell/phone within reach;with chair alarm set Nurse Communication: Mobility status PT Visit Diagnosis: Unsteadiness on feet (R26.81);Muscle weakness (generalized) (M62.81);Difficulty in walking, not elsewhere classified (R26.2)    Time: 1355-1430 PT Time Calculation (min) (ACUTE ONLY): 35 min   Charges:   PT Evaluation $PT Eval Moderate Complexity: 1 Mod PT Treatments $Gait Training: 8-22 mins        Verner Mould, DPT Acute Rehabilitation Services Office [phone]  05/17/22 3:14 PM

## 2022-05-17 NOTE — Progress Notes (Signed)
PROGRESS NOTE    [name]  QHU:[id number] DOB: 1956-01-27 DOA: 05/15/2022 PCP: Josetta Huddle, MD    Brief Narrative:  66 year old with history of myelodysplastic syndrome, alcoholic liver cirrhosis, depression anxiety found at home by sister with confusion, falling on the floor when she saw him after 4 days.  Brought to the ER.  In the emergency room, jaundiced, confused, bilirubin more than 7.  Blood pressure stable.  Treated as decompensated cirrhosis.  Ammonia was 78.   Assessment & Plan:  Alcoholic cirrhosis with encephalopathy: MELD score 28.  Confusion and colopathy.  LFTs worsening.  Coagulopathic with INR 2.4. Symptomatic treatment, aggressive lactulose therapy. Continue to monitor.  Cultures pending and negative so far.  Unsure whether we are treating an infection.  We will continue cefepime for 24 hours, discontinue Flagyl and vancomycin as he already received it for 48 hours. Abdominal ultrasound with no ascites, no indication for paracentesis. Replace potassium for hypokalemia.  Myelodysplastic syndrome, pancytopenia related to chronic liver disease and myelodysplastic syndrome Adult failure to thrive  Symptomatic treatment. Palliative to consult, goal of care and also may benefit with hospice level of care.   DVT prophylaxis: SCDs Start: 05/15/22 2030   Code Status: DNR Family Communication: Sister at the bedside 10/3. Disposition Plan: Status is: Inpatient Remains inpatient appropriate because: Encephalopathy     Consultants:  Gastroenterology Palliative  Procedures:  None  Antimicrobials:  Vancomycin cefepime and Flagyl 10/2--- 10/4 Cefepime 10/4---   Subjective:  Patient seen and examined.  More awake but forgetful.  He keeps asking who is this provider.  He knows [facility], he is not oriented to situation.  He is not sure he is sick.  I tried to explain to him about the reason for hospitalization, anticipating rehab.  He  tells me he is going home, again confused.  Objective: Vitals:   05/16/22 1801 05/16/22 2217 05/17/22 0314 05/17/22 0628  BP: 116/73 (!) 128/57 125/67 (!) 135/57  Pulse: 77 87 76 80  Resp: 18   20  Temp: 98.1 F (36.7 C) 98.1 F (36.7 C) 98.1 F (36.7 C) 98.4 F (36.9 C)  TempSrc: Oral Oral Oral Oral  SpO2: 100% 98% 98% 96%  Weight: 62.2 kg     Height: '5\' 9"'$  (1.753 m)       Intake/Output Summary (Last 24 hours) at 05/17/2022 1335 Last data filed at 05/17/2022 0700 Gross per 24 hour  Intake 5459.76 ml  Output 450 ml  Net 5009.76 ml   Filed Weights   05/15/22 1310 05/16/22 1801  Weight: 65.3 kg 62.2 kg    Examination:  General exam: Appears anxious.  Frail and debilitated.  Deeply icteric chronically sick looking. Respiratory system: No added sounds. Cardiovascular system: S1 & S2 heard, RRR.  Tachycardia . Gastrointestinal system: Abdomen is soft.  Nontender.  No palpable organomegaly.   Central nervous system: Alert and awake.  Oriented to himself, not oriented to place or time. No evidence of tremors or asterixis. Extremities: Moves all extremities.    Data Reviewed: I have personally reviewed following labs and imaging studies  CBC: Recent Labs  Lab 05/15/22 1150 05/16/22 0456 05/17/22 1005  WBC 5.8 4.4 3.7*  NEUTROABS 4.6  --  2.7  HGB 8.8* 7.8* 7.9*  HCT 29.2* 25.9* 26.5*  MCV 103.5* 102.8* 104.7*  PLT 86* 77* 83*   Basic Metabolic Panel: Recent Labs  Lab 05/15/22 1150 05/16/22 0456 05/17/22 1005  NA 146* 146* 143  K  3.8 3.5 3.1*  CL 116* 118* 114*  CO2 '22 22 22  '$ GLUCOSE 92 88 116*  BUN 30* 27* 22  CREATININE 1.63* 1.41* 1.33*  CALCIUM 9.7 9.2 9.0   GFR: Estimated Creatinine Clearance: 46.8 mL/min (A) (by C-G formula based on SCr of 1.33 mg/dL (H)). Liver Function Tests: Recent Labs  Lab 05/15/22 1150 05/16/22 0456 05/17/22 1005  AST 57* 50* 52*  ALT '20 17 18  '$ ALKPHOS 69 57 55  BILITOT 7.3* 6.2* 5.5*  PROT 6.9 6.0* 6.0*  ALBUMIN  2.8* 2.4* 2.3*   No results for input(s): "LIPASE", "AMYLASE" in the last 168 hours. Recent Labs  Lab 05/15/22 1159 05/16/22 0456 05/17/22 1005  AMMONIA 78* 42* 46*   Coagulation Profile: Recent Labs  Lab 05/15/22 1150 05/16/22 0456 05/17/22 1005  INR 2.4* 2.6* 2.4*   Cardiac Enzymes: Recent Labs  Lab 05/15/22 1704  CKTOTAL 211   BNP (last 3 results) No results for input(s): "PROBNP" in the last 8760 hours. HbA1C: No results for input(s): "HGBA1C" in the last 72 hours. CBG: Recent Labs  Lab 05/15/22 1136  GLUCAP 85   Lipid Profile: No results for input(s): "CHOL", "HDL", "LDLCALC", "TRIG", "CHOLHDL", "LDLDIRECT" in the last 72 hours. Thyroid Function Tests: No results for input(s): "TSH", "T4TOTAL", "FREET4", "T3FREE", "THYROIDAB" in the last 72 hours. Anemia Panel: No results for input(s): "VITAMINB12", "FOLATE", "FERRITIN", "TIBC", "IRON", "RETICCTPCT" in the last 72 hours. Sepsis Labs: Recent Labs  Lab 05/15/22 1430 05/15/22 1704 05/15/22 1720 05/15/22 2138  PROCALCITON  --   --   --  0.18  LATICACIDVEN >9.0* 1.6 1.4 1.7    Recent Results (from the past 240 hour(s))  Blood Culture (routine x 2)     Status: None (Preliminary result)   Collection Time: 05/15/22 11:50 AM   Specimen: BLOOD  Result Value Ref Range Status   Specimen Description   Final    BLOOD BLOOD RIGHT ARM Performed at [facility] [address], [place], [place] [address]    Special Requests   Final    BOTTLES DRAWN AEROBIC AND ANAEROBIC Blood Culture results may not be optimal due to an inadequate volume of blood received in culture bottles Performed at [facility] [address], [place], [place] [address]    Culture   Final    NO GROWTH 2 DAYS Performed at [facility] [address], [place], [place] [address]    Report Status PENDING  Incomplete  Blood Culture (routine x 2)     Status: None (Preliminary result)   Collection  Time: 05/15/22 11:50 AM   Specimen: BLOOD  Result Value Ref Range Status   Specimen Description   Final    BLOOD RIGHT ANTECUBITAL Performed at [facility] [address] [place], [place] [address]    Special Requests   Final    BOTTLES DRAWN AEROBIC AND ANAEROBIC Blood Culture adequate volume Performed at [facility] [address] [place], [place] [address]    Culture   Final    NO GROWTH 2 DAYS Performed at [facility] [address] [place], [place] [address]    Report Status PENDING  Incomplete  Urine Culture     Status: None   Collection Time: 05/15/22 11:50 AM   Specimen: In/Out Cath Urine  Result Value Ref Range Status   Specimen Description   Final    IN/OUT CATH URINE Performed at [facility], [place] [address], [place], [place] [address]    Special Requests   Final    NONE Performed at [facility], [place] [address] [place], [place] [address]    Culture   Final    NO GROWTH Performed at [facility], [place] [address], [place], [place] [address]    Report Status 05/16/2022 FINAL  Final  SARS Coronavirus 2 by RT PCR (hospital order, performed in [facility] hospital lab) *cepheid single result test*     Status: None   Collection Time: 05/15/22 11:59 AM   Specimen: Nasal Swab  Result Value Ref Range Status   SARS Coronavirus 2 by RT PCR NEGATIVE NEGATIVE Final    Comment: (NOTE) SARS-CoV-2 target nucleic acids are NOT DETECTED.  The SARS-CoV-2 RNA is generally detectable in upper and lower respiratory specimens during the acute phase of infection. The lowest concentration of SARS-CoV-2 viral copies this assay can detect is 250 copies / mL. A negative result does not preclude SARS-CoV-2 infection and should not be used as the sole basis for treatment or other patient management decisions.  A negative result may occur with improper specimen collection / handling, submission  of specimen other than nasopharyngeal swab, presence of viral mutation(s) within the areas targeted by this assay, and inadequate number of viral copies (<250 copies / mL). A negative result must be combined with clinical observations, patient history, and epidemiological information.  Fact Sheet for Patients:   https://www.patel.info/  Fact Sheet for Healthcare Providers: https://hall.com/  This test is not yet approved or  cleared by the [place] FDA and has been authorized for detection and/or diagnosis of SARS-CoV-2 by FDA under an Emergency Use Authorization (EUA).  This EUA will remain in effect (meaning this test can be used) for the duration of the COVID-19 declaration under Section 564(b)(1) of the Act, 21 U.S.C. section 360bbb-3(b)(1), unless the authorization is terminated or revoked sooner.  Performed at [facility], [place] [address] [place], [place] [address]          Radiology Studies: US Abdomen Limited RUQ (LIVER/GB)  Result Date: 05/15/2022 CLINICAL DATA:  Elevated LFTs EXAM: ULTRASOUND ABDOMEN LIMITED RIGHT UPPER QUADRANT COMPARISON:  CT abdomen/pelvis dated 10/16/2017 FINDINGS: Gallbladder: 2 mm gallbladder polyp, benign. No follow-up is required due to small size. No gallstones or gallbladder wall thickening. Negative sonographic Murphy's sign. Common bile duct: Diameter: 4 mm Liver: Hyperechoic hepatic parenchyma, raising the possibly of hepatic steatosis. 11 mm simple cyst in the right hepatic lobe. Portal vein is patent on color Doppler imaging with normal direction of blood flow towards the liver. Other: Incidentally noted is a 2.8 cm right upper pole renal cyst. IMPRESSION: Suspected hepatic steatosis. Additional ancillary findings as above. Electronically Signed   By: Julian Hy M.D.   On: 05/15/2022 21:29        Scheduled Meds:  lactulose  30 g Oral TID   phytonadione  10 mg  Oral Daily   potassium chloride  40 mEq Oral BID   rifaximin  550 mg Oral BID   Continuous Infusions:  ceFEPime (MAXIPIME) IV 2 g (05/17/22 0527)   lactated ringers 125 mL/hr at 05/17/22 0157     LOS: 2 days    Time spent: 35 minutes    Barb Merino, MD [facility] Pager [phone]

## 2022-05-17 NOTE — Consult Note (Signed)
Consultation Note Date: 05/17/2022   Patient Name: [name]  DOB: 1956/05/21  MRN: [id number]  Age / Sex: 66 y.o., male  PCP: Josetta Huddle, MD Referring Physician: Barb Merino, MD  Reason for Consultation:  goals of care, hospice evaluation  HPI/Patient Profile: 66 y.o. male  with past medical history of alcoholic liver cirrhosis, MDS with pancytopenia, admitted on 05/15/2022 after being found down by his sister for unknown amount of time. Workup reveals decompensated hepatic failure, with hepatic encephalopathy and sepsis. Palliative medicine consulted for goc.       Primary Decision Maker HCPOA - sister Stanton Kidney  Discussion: Chart reviewed including labs, imaging, progress notes from this and previous admission and office visits.  Evaluated patient. Sister, Stanton Kidney was at bedside. He was oriented to place and person. He was able to tell me that his liver illness is "going to kill me".  But when more in depth discussion attempted regarding his overall goals of care, meaningful quality of life, etc- he became confused and tangential. On my interview I determined he is not able to participate in goals of care discussion and unable to make medical treatment decisions at this time. He was able to relay that he does not like taking lactulose, he feels anxious at times.  I met separately with [facility]. She has been assisting in [place]'s care for the last year and has seen significant changes and decline.  Prior to this admission- he was sleeping most of the day. He was not eating or drinking. He was not adequately able to care for himself. He has lost a significant amount of weight and appears cachetic. Stanton Kidney says she knows he is dying and asked about his prognosis.  We discussed that medical interventions can keep bodies alive for long amounts of time, but may not improve quality of life.  We discussed continued  aggressive care, rehospitalization vs transition to full comfort measures and hospice.  Stanton Kidney was clear in decision for transition to comfort and seek inpatient hospice in [place].    SUMMARY OF RECOMMENDATIONS -Transition to full comfort measures only -D/C medications not providing comfort, d/c labs, d/c IV fluids -Comfort medications ordered- 758m morphine concentrate SL q1hr prn SOB, air hunger Lorazepam 169mpo, SL or IV q4 hr anxiety Haldol .58m31mo, SL or IV q4 hr PRN agitation Glycopyrrolate .2mg34m q4hr secretions -TOC referral for inpatient hospice in [place]   Prognosis:   < 2 weeks due to sepsis in the setting of decompensated hepatic failure with hepatic encephalopathy and MDS with plans for comfort measures only  Discharge Planning: Hospice facility pending evaluation and acceptance  Primary Diagnoses: Present on Admission:  Hepatic encephalopathy (HCC)Hurstactic acidosis  Thrombocytopenia (HCC)Bunnellnemia   Review of Systems  Unable to perform ROS: Mental status change    Physical Exam Vitals and nursing note reviewed.  Eyes:     General: Scleral icterus present.  Skin:    Coloration: Skin is jaundiced.  Neurological:  Mental Status: He is disoriented.  Psychiatric:     Comments: Confused, tangential     Vital Signs: BP 130/64 (BP Location: Right Arm)   Pulse 81   Temp 99.4 F (37.4 C) (Oral)   Resp 20   Ht '5\' 9"'  (1.753 m)   Wt 62.2 kg   SpO2 99%   BMI 20.25 kg/m  Pain Scale: 0-10   Pain Score: 0-No pain   SpO2: SpO2: 99 % O2 Device:SpO2: 99 % O2 Flow Rate: .   IO: Intake/output summary:  Intake/Output Summary (Last 24 hours) at 05/17/2022 1525 Last data filed at 05/17/2022 0700 Gross per 24 hour  Intake 5459.76 ml  Output 450 ml  Net 5009.76 ml    LBM: Last BM Date : 05/16/22 Baseline Weight: Weight: 65.3 kg Most recent weight: Weight: 62.2 kg       Thank you for this consult.  Palliative medicine will continue to follow and assist as needed.   Greater than 50%  of this time was spent counseling and coordinating care related to the above assessment and plan.  Signed by: Mariana Kaufman, AGNP-C Palliative Medicine    Please contact Palliative Medicine Team phone at [phone] for questions and concerns.  For individual provider: See Shea Evans

## 2022-05-18 DIAGNOSIS — Z515 Encounter for palliative care: Secondary | ICD-10-CM | POA: Diagnosis not present

## 2022-05-18 DIAGNOSIS — G9340 Encephalopathy, unspecified: Secondary | ICD-10-CM

## 2022-05-18 DIAGNOSIS — Z7189 Other specified counseling: Secondary | ICD-10-CM | POA: Diagnosis not present

## 2022-05-18 DIAGNOSIS — K7682 Hepatic encephalopathy: Secondary | ICD-10-CM | POA: Diagnosis not present

## 2022-05-18 MED ORDER — ALUM & MAG HYDROXIDE-SIMETH 200-200-20 MG/5ML PO SUSP
30.0000 mL | ORAL | Status: DC | PRN
  Administered 2022-05-18: 30 mL via ORAL
  Filled 2022-05-18: qty 30

## 2022-05-18 MED ORDER — PANTOPRAZOLE SODIUM 20 MG PO TBEC
20.0000 mg | DELAYED_RELEASE_TABLET | Freq: Every day | ORAL | Status: DC
  Administered 2022-05-18: 20 mg via ORAL
  Filled 2022-05-18: qty 1

## 2022-05-18 NOTE — Progress Notes (Addendum)
Manufacturing engineer [facility]) Hospital Liaison Note  Received request from [employer] [place] for family interest in [facility]. Visited patient at bedside and spoke with sister/Mary to confirm interest and explain services.  Approval for Hospice Home is determined by [facility] MD. Once [facility] MD has determined Hospice Home eligibility, J[place] will update hospital staff and family.  Addendum 1:33 pm- Patient approved for the Hospice Home. Unfortunately, Hospice Home is not able to offer a room today. Family and [facility] Manager aware hospital liaison will follow up tomorrow or sooner if a room becomes available.    Please do not hesitate to call with any hospice related questions.    Thank you for the opportunity to participate in this patient's care.   Daphene Calamity, MSW [facility] Liaison [phone]

## 2022-05-18 NOTE — Progress Notes (Signed)
Daily Progress Note   Patient Name: [name]       Date: 05/18/2022 DOB: 02-03-1956  Age: 66 y.o. MRN#: [id number] Attending Physician: Barb Merino, MD Primary Care Physician: Josetta Huddle, MD Admit Date: 05/15/2022  Reason for Consultation/Follow-up: Establishing goals of care  Patient Profile/HPI:   66 y.o. male  with past medical history of alcoholic liver cirrhosis, MDS with pancytopenia, admitted on 05/15/2022 after being found down by his sister for unknown amount of time. Workup reveals decompensated hepatic failure, with hepatic encephalopathy and sepsis. Palliative medicine consulted for goc.    Subjective: Evaluated patient.  He is in bed he looks more fatigued today, he is more confused today.  He does not complain of pain but he appears anxious.  He says he feels like he has fought a war and lost.  His sister is at bedside.  She is awaiting notice from case management on referral to inpatient hospice house.    Physical Exam Vitals and nursing note reviewed.  Constitutional:      Appearance: He is ill-appearing.     Comments: Cachectic  Eyes:     General: Scleral icterus present.  Skin:    Coloration: Skin is jaundiced.             Vital Signs: BP (!) 123/55 (BP Location: Left Arm)   Pulse 79   Temp 98.7 F (37.1 C) (Oral)   Resp 20   Ht '5\' 9"'$  (1.753 m)   Wt 62.2 kg   SpO2 94%   BMI 20.25 kg/m  SpO2: SpO2: 94 % O2 Device: O2 Device: Room Air O2 Flow Rate:    Intake/output summary:  Intake/Output Summary (Last 24 hours) at 05/18/2022 1200 Last data filed at 05/18/2022 0900 Gross per 24 hour  Intake 2006.45 ml  Output --  Net 2006.45 ml   LBM: Last BM Date : 05/16/22 Baseline Weight: Weight: 65.3 kg Most recent weight: Weight: 62.2 kg        Palliative Assessment/Data: PPS: 20%      Patient Active Problem List   Diagnosis Date Noted   Pressure injury of skin 83/81/8403   Alcoholic cirrhosis of liver without ascites ([place])    Coagulopathy ([place])    Hepatic encephalopathy ([place]) 05/15/2022   Stage 3b chronic kidney disease (CKD) ([place]) 05/15/2022  Elevated LFTs 05/15/2022   Anxiety 05/15/2022   Pulmonary edema 11/23/2021   Positive D dimer 11/23/2021   AKI (acute kidney injury) ([place]) 11/23/2021   Tachycardia, paroxysmal ([place]) 11/22/2021   Elevated serum creatinine 11/22/2021   Elevated troponin 11/22/2021   History of revision of total replacement of right hip joint 11/21/2021   Low grade myelodysplastic syndrome lesions ([place]) 08/01/2021   Suicidal ideation 05/13/2021   Hypercalcemia 09/19/2019   Thrombocytopenia ([place]) 09/19/2019   Anemia    Demand ischemia    ARF (acute renal failure) (HCC)    Lactic acidosis    Gastrointestinal hemorrhage with melena    Acute lower gastrointestinal hemorrhage 04/28/2017   Reducible left inguinal hernia 12/03/2014   Incisional hernia 03/16/2014   Ventral incisional hernia 11/12/2013    Palliative Care Assessment & Plan    Assessment/Recommendations/Plan  Continue current comfort orders Awaiting evaluation for inpatient hospice   Code Status: DNR  Prognosis:  < 2 weeks  Discharge Planning: Hospice facility pending evaluation and availability  Care plan was discussed with patient's family and care team.  Thank you for allowing the Palliative Medicine Team to assist in the care of this patient.  Greater than 50%  of this time was spent counseling and coordinating care related to the above assessment and plan.  Mariana Kaufman, AGNP-C Palliative Medicine   Please contact Palliative Medicine Team phone at [phone] for questions and concerns.

## 2022-05-18 NOTE — TOC Progression Note (Addendum)
Transition of Care [facility]) - Progression Note    Patient Details  Name: [name] MRN: [id number] Date of Birth: Jan 20, 1956  Transition of Care [facility]) CM/SW Contact  Henrietta Dine, RN Phone Number: 05/18/2022, 9:24 AM  Clinical Narrative:    Attempted to contact pt's sister [name] [phone]) to discuss referral for residential hospice; no answer; unable to leave VM; TOC will con't to follow. -[id number] - pt confused and unable to make decisions; spoke with is sister [name] outside the room; she is agreeable to residential hospice and requests [facility] ([place]); contacted Laren Boom. [facility] Intake and she will send this referral to the hospital liaison team; [facility] will con't to follow.        Expected Discharge Plan and Services                                                 Social Determinants of Health (SDOH) Interventions    Readmission Risk Interventions     No data to display

## 2022-05-18 NOTE — Discharge Summary (Signed)
Physician Discharge Summary  [name] YJE:[id number] DOB: 1956-07-22 DOA: 05/15/2022  PCP: Josetta Huddle, MD  Admit date: 05/15/2022 Discharge date: 05/18/2022  Admitted From: Home Disposition: Inpatient hospice for end-of-life care  Recommendations for Outpatient Follow-up:  As per hospice  Discharge Condition: Critical CODE STATUS: Comfort care Diet recommendation: Regular diet as tolerated  Discharge summary: 66 year old with history of myelodysplastic syndrome, alcoholic liver cirrhosis, depression , anxiety found at home by sister with confusion, on the floor when she saw him after 4 days.  Brought to the ER.  In the emergency room, jaundiced, confused, bilirubin more than 7.  Blood pressure stable.  Treated as decompensated cirrhosis.  Ammonia was 78. Patient remained in very poor clinical status and ultimately was converted to comfort care and hospice.   Alcoholic cirrhosis with encephalopathy: MELD score 28.  Confusion and coagulopathy.  LFTs worsening.  Coagulopathic with INR 2.4. Myelodysplastic syndrome, pancytopenia. Adult failure to thrive. Acute metabolic encephalopathy, acute hepatic encephalopathy. Poor prognosis.   Plan: Due to above conditions shared decision making was done with healthcare power of attorney and patient was started on comfort care and hospice measures. All comfort care medications available.  No lab draws.  No escalation of care. Transfer to inpatient hospice when bed available for end-of-life care. Patient is stable to transfer. Medicate for comfort before transferring an ambulance. Rest of the medication management as per inpatient hospice provider.     Discharge Diagnoses:  Principal Problem:   Hepatic encephalopathy (HCC) Active Problems:   Anemia   Lactic acidosis   Thrombocytopenia (HCC)   Stage 3b chronic kidney disease (CKD) (HCC)   Elevated LFTs   Anxiety   Pressure injury of skin    Discharge Instructions  Discharge  Instructions     Diet general   Complete by: As directed    Discharge wound care:   Complete by: As directed    Standard pressure point protections   Increase activity slowly   Complete by: As directed       Allergies as of 05/18/2022       Reactions   Triamterene-hctz Other (See Comments)   dizzy   Chantix [varenicline] Nausea And Vomiting   Codeine Itching, Nausea And Vomiting   If taken on empty stomach   Fluconazole Other (See Comments)   Blisters on lips   Lasix [furosemide] Other (See Comments)   Blisters on lips   Sulfa Antibiotics Other (See Comments)   CAUSED BLISTERS IN THE MOUTH   Tramadol Swelling   Wellbutrin [bupropion] Nausea And Vomiting        Medication List     STOP taking these medications    epoetin alfa-epbx 40000 UNIT/ML injection Commonly known as: RETACRIT   ferrous sulfate 325 (65 FE) MG tablet   LEG CRAMPS PO   mirtazapine 15 MG tablet Commonly known as: Remeron   oxyCODONE 5 MG immediate release tablet Commonly known as: Oxy IR/ROXICODONE   PEPCID COMPLETE PO   temazepam 15 MG capsule Commonly known as: RESTORIL       TAKE these medications    acetaminophen 325 MG tablet Commonly known as: TYLENOL Take 325-650 mg by mouth every 6 (six) hours as needed for moderate pain or headache.   ALPRAZolam 0.25 MG tablet Commonly known as: XANAX Take 0.125-0.25 mg by mouth daily as needed for anxiety.               Discharge Care Instructions  (From admission, onward)  Start     Ordered   05/18/22 0000  Discharge wound care:       Comments: Standard pressure point protections   05/18/22 1524            Allergies  Allergen Reactions   Triamterene-Hctz Other (See Comments)    dizzy   Chantix [Varenicline] Nausea And Vomiting   Codeine Itching and Nausea And Vomiting    If taken on empty stomach   Fluconazole Other (See Comments)    Blisters on lips   Lasix [Furosemide] Other (See Comments)     Blisters on lips   Sulfa Antibiotics Other (See Comments)    CAUSED BLISTERS IN THE MOUTH   Tramadol Swelling   Wellbutrin [Bupropion] Nausea And Vomiting    Consultations: Gastroenterology Palliative and hospice   Procedures/Studies: US Abdomen Limited RUQ (LIVER/GB)  Result Date: 05/15/2022 CLINICAL DATA:  Elevated LFTs EXAM: ULTRASOUND ABDOMEN LIMITED RIGHT UPPER QUADRANT COMPARISON:  CT abdomen/pelvis dated 10/16/2017 FINDINGS: Gallbladder: 2 mm gallbladder polyp, benign. No follow-up is required due to small size. No gallstones or gallbladder wall thickening. Negative sonographic Murphy's sign. Common bile duct: Diameter: 4 mm Liver: Hyperechoic hepatic parenchyma, raising the possibly of hepatic steatosis. 11 mm simple cyst in the right hepatic lobe. Portal vein is patent on color Doppler imaging with normal direction of blood flow towards the liver. Other: Incidentally noted is a 2.8 cm right upper pole renal cyst. IMPRESSION: Suspected hepatic steatosis. Additional ancillary findings as above. Electronically Signed   By: Julian Hy M.D.   On: 05/15/2022 21:29   DG Chest Port 1 View  Result Date: 05/15/2022 CLINICAL DATA:  Possible sepsis. EXAM: PORTABLE CHEST 1 VIEW COMPARISON:  Chest radiograph 11/22/2021 and earlier FINDINGS: Stable mild enlargement of the cardiac silhouette. Aortic calcifications. Upper lobe predominant emphysematous changes. No focal consolidation, pleural effusion, or pneumothorax. IMPRESSION: No acute cardiopulmonary abnormality. Emphysema (ICD10-J43.9). Aortic Atherosclerosis (ICD10-I70.0). Electronically Signed   By: Ileana Roup M.D.   On: 05/15/2022 12:19   (Echo, Carotid, EGD, Colonoscopy, ERCP)    Subjective: Saw patient in the morning rounds.  Sleepy, confused on waking up.   Discharge Exam: Vitals:   05/18/22 0403 05/18/22 1334  BP: (!) 123/55 (!) 110/51  Pulse: 79 76  Resp: 20 16  Temp: 98.7 F (37.1 C) 99.4 F (37.4 C)  SpO2: 94% 96%    Vitals:   05/17/22 0628 05/17/22 1434 05/18/22 0403 05/18/22 1334  BP: (!) 135/57 130/64 (!) 123/55 (!) 110/51  Pulse: 80 81 79 76  Resp: '20 20 20 16  '$ Temp: 98.4 F (36.9 C) 99.4 F (37.4 C) 98.7 F (37.1 C) 99.4 F (37.4 C)  TempSrc: Oral Oral Oral Oral  SpO2: 96% 99% 94% 96%  Weight:      Height:        General exam: Appears anxious.  Frail and debilitated.  Deeply icteric chronically sick looking. Medicated for comfort.  Looks fairly comfortable today.  On room air. Respiratory system: No added sounds. Cardiovascular system: S1 & S2 heard, RRR.  Tachycardia . Gastrointestinal system: Abdomen is soft.  Nontender.  No palpable organomegaly.   The results of significant diagnostics from this hospitalization (including imaging, microbiology, ancillary and laboratory) are listed below for reference.     Microbiology: Recent Results (from the past 240 hour(s))  Blood Culture (routine x 2)     Status: None (Preliminary result)   Collection Time: 05/15/22 11:50 AM   Specimen: BLOOD  Result Value Ref Range  Status   Specimen Description   Final    BLOOD BLOOD RIGHT ARM Performed at [facility] [address] [place], [place] [address]    Special Requests   Final    BOTTLES DRAWN AEROBIC AND ANAEROBIC Blood Culture results may not be optimal due to an inadequate volume of blood received in culture bottles Performed at [facility] [address] [place], [place] [address]    Culture   Final    NO GROWTH 3 DAYS Performed at [facility], [place] [address] [place], [place] [address]    Report Status PENDING  Incomplete  Blood Culture (routine x 2)     Status: None (Preliminary result)   Collection Time: 05/15/22 11:50 AM   Specimen: BLOOD  Result Value Ref Range Status   Specimen Description   Final    BLOOD RIGHT ANTECUBITAL Performed at [facility] [name], [place], [place] [address]    Special  Requests   Final    BOTTLES DRAWN AEROBIC AND ANAEROBIC Blood Culture adequate volume Performed at [facility] [address], [place], [place] [address]    Culture   Final    NO GROWTH 3 DAYS Performed at [facility], [place] [address], [place], [place] [address]    Report Status PENDING  Incomplete  Urine Culture     Status: None   Collection Time: 05/15/22 11:50 AM   Specimen: In/Out Cath Urine  Result Value Ref Range Status   Specimen Description   Final    IN/OUT CATH URINE Performed at [facility] [address] [place], [place] [address]    Special Requests   Final    NONE Performed at [facility], [place] [address], [place], [place] [address]    Culture   Final    NO GROWTH Performed at [facility], [place] [address] [place], [place] [address]    Report Status 05/16/2022 FINAL  Final  SARS Coronavirus 2 by RT PCR (hospital order, performed in [facility] hospital lab) *cepheid single result test*     Status: None   Collection Time: 05/15/22 11:59 AM   Specimen: Nasal Swab  Result Value Ref Range Status   SARS Coronavirus 2 by RT PCR NEGATIVE NEGATIVE Final    Comment: (NOTE) SARS-CoV-2 target nucleic acids are NOT DETECTED.  The SARS-CoV-2 RNA is generally detectable in upper and lower respiratory specimens during the acute phase of infection. The lowest concentration of SARS-CoV-2 viral copies this assay can detect is 250 copies / mL. A negative result does not preclude SARS-CoV-2 infection and should not be used as the sole basis for treatment or other patient management decisions.  A negative result may occur with improper specimen collection / handling, submission of specimen other than nasopharyngeal swab, presence of viral mutation(s) within the areas targeted by this assay, and inadequate number of viral copies (<250 copies / mL). A negative result must be combined with  clinical observations, patient history, and epidemiological information.  Fact Sheet for Patients:   https://www.patel.info/  Fact Sheet for Healthcare Providers: https://hall.com/  This test is not yet approved or  cleared by the [place] FDA and has been authorized for detection and/or diagnosis of SARS-CoV-2 by FDA under an Emergency Use Authorization (EUA).  This EUA will remain in effect (meaning this test can be used) for the duration of the COVID-19 declaration under Section 564(b)(1)  of the Act, 21 U.S.C. section 360bbb-3(b)(1), unless the authorization is terminated or revoked sooner.  Performed at [facility], [place] [address], [place], [place] [address]      Labs: BNP (last 3 results) No results for input(s): "BNP" in the last 8760 hours. Basic Metabolic Panel: Recent Labs  Lab 05/15/22 1150 05/16/22 0456 05/17/22 1005  NA 146* 146* 143  K 3.8 3.5 3.1*  CL 116* 118* 114*  CO2 '22 22 22  '$ GLUCOSE 92 88 116*  BUN 30* 27* 22  CREATININE 1.63* 1.41* 1.33*  CALCIUM 9.7 9.2 9.0   Liver Function Tests: Recent Labs  Lab 05/15/22 1150 05/16/22 0456 05/17/22 1005  AST 57* 50* 52*  ALT '20 17 18  '$ ALKPHOS 69 57 55  BILITOT 7.3* 6.2* 5.5*  PROT 6.9 6.0* 6.0*  ALBUMIN 2.8* 2.4* 2.3*   No results for input(s): "LIPASE", "AMYLASE" in the last 168 hours. Recent Labs  Lab 05/15/22 1159 05/16/22 0456 05/17/22 1005  AMMONIA 78* 42* 46*   CBC: Recent Labs  Lab 05/15/22 1150 05/16/22 0456 05/17/22 1005  WBC 5.8 4.4 3.7*  NEUTROABS 4.6  --  2.7  HGB 8.8* 7.8* 7.9*  HCT 29.2* 25.9* 26.5*  MCV 103.5* 102.8* 104.7*  PLT 86* 77* 83*   Cardiac Enzymes: Recent Labs  Lab 05/15/22 1704  CKTOTAL 211   BNP: Invalid input(s): "POCBNP" CBG: Recent Labs  Lab 05/15/22 1136  GLUCAP 85   D-Dimer No results for input(s): "DDIMER" in the last 72 hours. Hgb A1c No results for input(s): "HGBA1C"  in the last 72 hours. Lipid Profile No results for input(s): "CHOL", "HDL", "LDLCALC", "TRIG", "CHOLHDL", "LDLDIRECT" in the last 72 hours. Thyroid function studies No results for input(s): "TSH", "T4TOTAL", "T3FREE", "THYROIDAB" in the last 72 hours.  Invalid input(s): "FREET3" Anemia work up No results for input(s): "VITAMINB12", "FOLATE", "FERRITIN", "TIBC", "IRON", "RETICCTPCT" in the last 72 hours. Urinalysis    Component Value Date/Time   COLORURINE AMBER (A) 05/15/2022 1150   APPEARANCEUR CLEAR 05/15/2022 1150   APPEARANCEUR Cloudy 06/12/2012 1454   LABSPEC 1.013 05/15/2022 1150   LABSPEC 1.021 06/12/2012 1454   PHURINE 6.0 05/15/2022 1150   GLUCOSEU NEGATIVE 05/15/2022 1150   GLUCOSEU Negative 06/12/2012 1454   HGBUR MODERATE (A) 05/15/2022 1150   BILIRUBINUR NEGATIVE 05/15/2022 1150   BILIRUBINUR Negative 06/12/2012 1454   KETONESUR NEGATIVE 05/15/2022 1150   PROTEINUR 30 (A) 05/15/2022 1150   NITRITE NEGATIVE 05/15/2022 1150   LEUKOCYTESUR NEGATIVE 05/15/2022 1150   LEUKOCYTESUR 1+ 06/12/2012 1454   Sepsis Labs Recent Labs  Lab 05/15/22 1150 05/16/22 0456 05/17/22 1005  WBC 5.8 4.4 3.7*   Microbiology Recent Results (from the past 240 hour(s))  Blood Culture (routine x 2)     Status: None (Preliminary result)   Collection Time: 05/15/22 11:50 AM   Specimen: BLOOD  Result Value Ref Range Status   Specimen Description   Final    BLOOD BLOOD RIGHT ARM Performed at [facility] [address], [place], [place] [address]    Special Requests   Final    BOTTLES DRAWN AEROBIC AND ANAEROBIC Blood Culture results may not be optimal due to an inadequate volume of blood received in culture bottles Performed at [facility] [address] [place], [place] [address]    Culture   Final    NO GROWTH 3 DAYS Performed at [facility], [place] [address] [place], [place] [address]  Report Status PENDING  Incomplete  Blood  Culture (routine x 2)     Status: None (Preliminary result)   Collection Time: 05/15/22 11:50 AM   Specimen: BLOOD  Result Value Ref Range Status   Specimen Description   Final    BLOOD RIGHT ANTECUBITAL Performed at [facility] [address], [place], [place] [address]    Special Requests   Final    BOTTLES DRAWN AEROBIC AND ANAEROBIC Blood Culture adequate volume Performed at [facility] [address], [place], [place] [address]    Culture   Final    NO GROWTH 3 DAYS Performed at [facility], [place] [address] [place], [place] [address]    Report Status PENDING  Incomplete  Urine Culture     Status: None   Collection Time: 05/15/22 11:50 AM   Specimen: In/Out Cath Urine  Result Value Ref Range Status   Specimen Description   Final    IN/OUT CATH URINE Performed at [facility] [address], [place], [place] [address]    Special Requests   Final    NONE Performed at [facility], [place] [address] [place], [place] [address]    Culture   Final    NO GROWTH Performed at [facility], [place] [address] [place], [place] [address]    Report Status 05/16/2022 FINAL  Final  SARS Coronavirus 2 by RT PCR (hospital order, performed in [facility] hospital lab) *cepheid single result test*     Status: None   Collection Time: 05/15/22 11:59 AM   Specimen: Nasal Swab  Result Value Ref Range Status   SARS Coronavirus 2 by RT PCR NEGATIVE NEGATIVE Final    Comment: (NOTE) SARS-CoV-2 target nucleic acids are NOT DETECTED.  The SARS-CoV-2 RNA is generally detectable in upper and lower respiratory specimens during the acute phase of infection. The lowest concentration of SARS-CoV-2 viral copies this assay can detect is 250 copies / mL. A negative result does not preclude SARS-CoV-2 infection and should not be used as the sole basis for treatment or other patient management decisions.  A negative  result may occur with improper specimen collection / handling, submission of specimen other than nasopharyngeal swab, presence of viral mutation(s) within the areas targeted by this assay, and inadequate number of viral copies (<250 copies / mL). A negative result must be combined with clinical observations, patient history, and epidemiological information.  Fact Sheet for Patients:   https://www.patel.info/  Fact Sheet for Healthcare Providers: https://hall.com/  This test is not yet approved or  cleared by the [place] FDA and has been authorized for detection and/or diagnosis of SARS-CoV-2 by FDA under an Emergency Use Authorization (EUA).  This EUA will remain in effect (meaning this test can be used) for the duration of the COVID-19 declaration under Section 564(b)(1) of the Act, 21 U.S.C. section 360bbb-3(b)(1), unless the authorization is terminated or revoked sooner.  Performed at [facility], [place] [address], [place], [place] [address]      Time coordinating discharge: 35 minutes  SIGNED:   Barb Merino, MD  [facility] 05/18/2022, 3:25 PM

## 2022-05-18 NOTE — Progress Notes (Signed)
Patient was seen by palliative care.  Plan is for comfort care and transfer to inpatient hospice.  Several of our recommended medications including lactulose, Xifaxan, etc. have been discontinued as only comfort medications are required at this point.  GI signing off.

## 2022-05-18 NOTE — Progress Notes (Signed)
Attempted to give report to [facility], callback number given and this nurse was told "someone will call you shortly."

## 2022-05-18 NOTE — Progress Notes (Signed)
PROGRESS NOTE    [name]  KWI:[id number] DOB: Jun 23, 1956 DOA: 05/15/2022 PCP: Josetta Huddle, MD    Brief Narrative:  66 year old with history of myelodysplastic syndrome, alcoholic liver cirrhosis, depression anxiety found at home by sister with confusion, falling on the floor when she saw him after 4 days.  Brought to the ER.  In the emergency room, jaundiced, confused, bilirubin more than 7.  Blood pressure stable.  Treated as decompensated cirrhosis.  Ammonia was 78. Patient remained in very poor clinical status and ultimately was converted to comfort care and hospice.  Waiting for inpatient hospice bed.   Assessment & Plan:  Alcoholic cirrhosis with encephalopathy: MELD score 28.  Confusion and colopathy.  LFTs worsening.  Coagulopathic with INR 2.4. Myelodysplastic syndrome, pancytopenia. Adult failure to thrive. Acute metabolic encephalopathy, acute hepatic encephalopathy. Poor prognosis.  Plan: Due to above conditions shared decision making was done with healthcare power of attorney and patient was started on comfort care and hospice measures. All comfort care medications available.  No lab draws.  No escalation of care. Transfer to inpatient hospice when bed available for end-of-life care. Unrestricted visitor policy. RN can pronounce death if happens in the hospital.   DVT prophylaxis:   Comfort care   Code Status: Comfort care Family Communication: Sister  Disposition Plan: Status is: Inpatient Remains inpatient appropriate because: Encephalopathy, now comfort care.     Consultants:  Gastroenterology Palliative  Procedures:  None  Antimicrobials:  Vancomycin cefepime and Flagyl 10/2--- 10/4 Cefepime 10/4--- 10/5   Subjective:  Patient seen and examined.  Looks calm and quiet but confused.  Is repeating same questions.  Reportedly did not eat any meal last night.  He is not sure what to say when asked.  Objective: Vitals:   05/17/22 0628 05/17/22  1434 05/18/22 0403 05/18/22 1334  BP: (!) 135/57 130/64 (!) 123/55 (!) 110/51  Pulse: 80 81 79 76  Resp: '20 20 20 16  '$ Temp: 98.4 F (36.9 C) 99.4 F (37.4 C) 98.7 F (37.1 C) 99.4 F (37.4 C)  TempSrc: Oral Oral Oral Oral  SpO2: 96% 99% 94% 96%  Weight:      Height:        Intake/Output Summary (Last 24 hours) at 05/18/2022 1342 Last data filed at 05/18/2022 0900 Gross per 24 hour  Intake 1886.45 ml  Output --  Net 1886.45 ml    Filed Weights   05/15/22 1310 05/16/22 1801  Weight: 65.3 kg 62.2 kg    Examination:  General exam: Appears anxious.  Frail and debilitated.  Deeply icteric chronically sick looking. Medicated for comfort.  Looks fairly comfortable today.  On room air. Respiratory system: No added sounds. Cardiovascular system: S1 & S2 heard, RRR.  Tachycardia . Gastrointestinal system: Abdomen is soft.  Nontender.  No palpable organomegaly.       Data Reviewed: I have personally reviewed following labs and imaging studies  CBC: Recent Labs  Lab 05/15/22 1150 05/16/22 0456 05/17/22 1005  WBC 5.8 4.4 3.7*  NEUTROABS 4.6  --  2.7  HGB 8.8* 7.8* 7.9*  HCT 29.2* 25.9* 26.5*  MCV 103.5* 102.8* 104.7*  PLT 86* 77* 83*    Basic Metabolic Panel: Recent Labs  Lab 05/15/22 1150 05/16/22 0456 05/17/22 1005  NA 146* 146* 143  K 3.8 3.5 3.1*  CL 116* 118* 114*  CO2 '22 22 22  '$ GLUCOSE 92 88 116*  BUN 30* 27* 22  CREATININE 1.63* 1.41* 1.33*  CALCIUM 9.7 9.2 9.0  GFR: Estimated Creatinine Clearance: 46.8 mL/min (A) (by C-G formula based on SCr of 1.33 mg/dL (H)). Liver Function Tests: Recent Labs  Lab 05/15/22 1150 05/16/22 0456 05/17/22 1005  AST 57* 50* 52*  ALT '20 17 18  '$ ALKPHOS 69 57 55  BILITOT 7.3* 6.2* 5.5*  PROT 6.9 6.0* 6.0*  ALBUMIN 2.8* 2.4* 2.3*    No results for input(s): "LIPASE", "AMYLASE" in the last 168 hours. Recent Labs  Lab 05/15/22 1159 05/16/22 0456 05/17/22 1005  AMMONIA 78* 42* 46*    Coagulation  Profile: Recent Labs  Lab 05/15/22 1150 05/16/22 0456 05/17/22 1005  INR 2.4* 2.6* 2.4*    Cardiac Enzymes: Recent Labs  Lab 05/15/22 1704  CKTOTAL 211    BNP (last 3 results) No results for input(s): "PROBNP" in the last 8760 hours. HbA1C: No results for input(s): "HGBA1C" in the last 72 hours. CBG: Recent Labs  Lab 05/15/22 1136  GLUCAP 85    Lipid Profile: No results for input(s): "CHOL", "HDL", "LDLCALC", "TRIG", "CHOLHDL", "LDLDIRECT" in the last 72 hours. Thyroid Function Tests: No results for input(s): "TSH", "T4TOTAL", "FREET4", "T3FREE", "THYROIDAB" in the last 72 hours. Anemia Panel: No results for input(s): "VITAMINB12", "FOLATE", "FERRITIN", "TIBC", "IRON", "RETICCTPCT" in the last 72 hours. Sepsis Labs: Recent Labs  Lab 05/15/22 1430 05/15/22 1704 05/15/22 1720 05/15/22 2138  PROCALCITON  --   --   --  0.18  LATICACIDVEN >9.0* 1.6 1.4 1.7     Recent Results (from the past 240 hour(s))  Blood Culture (routine x 2)     Status: None (Preliminary result)   Collection Time: 05/15/22 11:50 AM   Specimen: BLOOD  Result Value Ref Range Status   Specimen Description   Final    BLOOD BLOOD RIGHT ARM Performed at [facility] [address] [place], [place] [address]    Special Requests   Final    BOTTLES DRAWN AEROBIC AND ANAEROBIC Blood Culture results may not be optimal due to an inadequate volume of blood received in culture bottles Performed at [facility] [address] [place], [place] [address]    Culture   Final    NO GROWTH 3 DAYS Performed at [facility], [place] [address], [place], [place] [address]    Report Status PENDING  Incomplete  Blood Culture (routine x 2)     Status: None (Preliminary result)   Collection Time: 05/15/22 11:50 AM   Specimen: BLOOD  Result Value Ref Range Status   Specimen Description   Final    BLOOD RIGHT ANTECUBITAL Performed at [facility] [address] [place], [place] [address]    Special Requests   Final    BOTTLES DRAWN AEROBIC AND ANAEROBIC Blood Culture adequate volume Performed at [facility] [address] [place], [place] [address]    Culture   Final    NO GROWTH 3 DAYS Performed at [facility], [place] [address], [place], [place] [address]    Report Status PENDING  Incomplete  Urine Culture     Status: None   Collection Time: 05/15/22 11:50 AM   Specimen: In/Out Cath Urine  Result Value Ref Range Status   Specimen Description   Final    IN/OUT CATH URINE Performed at [facility] [address], [place], [place] [address]    Special Requests   Final    NONE Performed at [facility], [place]  [place], [place] [address]    Culture   Final    NO GROWTH Performed at [facility], [place] [address], [place], [place] [address]    Report Status 05/16/2022 FINAL  Final  SARS Coronavirus 2 by RT PCR (hospital order, performed in [place] hospital lab) *cepheid single result test*     Status: None   Collection Time: 05/15/22 11:59 AM   Specimen: Nasal Swab  Result Value Ref Range Status   SARS Coronavirus 2 by RT PCR NEGATIVE NEGATIVE Final    Comment: (NOTE) SARS-CoV-2 target nucleic acids are NOT DETECTED.  The SARS-CoV-2 RNA is generally detectable in upper and lower respiratory specimens during the acute phase of infection. The lowest concentration of SARS-CoV-2 viral copies this assay can detect is 250 copies / mL. A negative result does not preclude SARS-CoV-2 infection and should not be used as the sole basis for treatment or other patient management decisions.  A negative result may occur with improper specimen collection / handling, submission of specimen other than nasopharyngeal swab, presence of viral mutation(s) within the areas targeted by this assay, and inadequate number of viral copies (<250 copies / mL). A  negative result must be combined with clinical observations, patient history, and epidemiological information.  Fact Sheet for Patients:   https://www.patel.info/  Fact Sheet for Healthcare Providers: https://hall.com/  This test is not yet approved or  cleared by the [place] FDA and has been authorized for detection and/or diagnosis of SARS-CoV-2 by FDA under an Emergency Use Authorization (EUA).  This EUA will remain in effect (meaning this test can be used) for the duration of the COVID-19 declaration under Section 564(b)(1) of the Act, 21 U.S.C. section 360bbb-3(b)(1), unless the authorization is terminated or revoked sooner.  Performed at [facility], [place] [address], [place],  [address]          Radiology Studies: No results found.      Scheduled Meds:   Continuous Infusions:     LOS: 3 days    Time spent: 35 minutes    Barb Merino, MD [facility] Pager [phone]

## 2022-05-18 NOTE — Plan of Care (Signed)
  Problem: Safety: Goal: Ability to remain free from injury will improve Outcome: Progressing   

## 2022-05-18 NOTE — Care Management Important Message (Signed)
Important Message  Patient Details IM Letter placed in Patients room. Name: [name] MRN: [id number] Date of Birth: 05-09-1956   Medicare Important Message Given:  Yes     Kerin Salen 05/18/2022, 12:40 PM

## 2022-05-18 NOTE — Progress Notes (Signed)
Unsuccessful attempt to call report to [facility] left with this nurse's callback number.

## 2022-05-18 NOTE — TOC Transition Note (Addendum)
Transition of Care [facility]) - CM/SW Discharge Note   Patient Details  Name: [name] MRN: [id number] Date of Birth: 1956-07-17  [facility] of [facility] CM/SW Contact:  Henrietta Dine, RN Phone Number: 05/18/2022, 3:57 PM   Clinical Narrative:    Notified by Lorayne Bender at [facility] of bed today; notified pt's sister and she accepts bed on pt's behalf; Lorayne Bender notified of acceptance; she would like to have transport arranged for 6pm; awaiting RM and call report #s.    - 1550 - spoke with Daphene Calamity; she says the pt's sister is having difficulty using docu-sign; they are trying to assist her because she is in [place]; she also says the call report # is [phone]; however she will have to call back with room #; she also says the transport time can be pushed back to 7pm if necessary; pt is to be transported via [place]; awaiting call back from [facility].  -1615 - return call from [facility]; consent signed; Room # 8 assigned to pt; RN notified of call report and room #s; PTAR, spoke with [facility]) contacted for 1800 pickup;  No TOC needs.  Final next level of care: Other (comment) ([facility] ([place] Barriers to Discharge: No Barriers Identified   Patient Goals and CMS Choice        Discharge Placement                Patient to be transferred to facility by: [place] Name of family member notified: [name] [phone]) [name] (sister) [phone]) Patient and family notified of of transfer: 05/18/22  Discharge Plan and Services                                     Social Determinants of Health (SDOH) Interventions     Readmission Risk Interventions     No data to display

## 2022-05-20 LAB — CULTURE, BLOOD (ROUTINE X 2)
Culture: NO GROWTH
Culture: NO GROWTH
Special Requests: ADEQUATE

## 2022-05-29 ENCOUNTER — Other Ambulatory Visit: Payer: Medicare Other

## 2022-05-29 ENCOUNTER — Ambulatory Visit: Payer: Medicare Other

## 2022-06-13 ENCOUNTER — Other Ambulatory Visit: Payer: Medicare Other

## 2022-06-13 ENCOUNTER — Ambulatory Visit: Payer: Medicare Other

## 2022-06-14 DEATH — deceased

## 2022-06-28 ENCOUNTER — Other Ambulatory Visit: Payer: Medicare Other

## 2022-06-28 ENCOUNTER — Ambulatory Visit: Payer: Medicare Other | Admitting: Physician Assistant

## 2022-06-28 ENCOUNTER — Ambulatory Visit: Payer: Medicare Other

## 2022-07-12 ENCOUNTER — Other Ambulatory Visit: Payer: Medicare Other

## 2022-07-12 ENCOUNTER — Ambulatory Visit: Payer: Medicare Other
